# Patient Record
Sex: Male | Born: 1975 | Race: Black or African American | Hispanic: No | Marital: Single | Smoking: Former smoker
Health system: Southern US, Community
[De-identification: ages and names within clinical notes are randomized; demographics above are authoritative.]

## PROBLEM LIST (undated history)

## (undated) DIAGNOSIS — F209 Schizophrenia, unspecified: Secondary | ICD-10-CM

## (undated) DIAGNOSIS — I10 Essential (primary) hypertension: Secondary | ICD-10-CM

## (undated) DIAGNOSIS — N289 Disorder of kidney and ureter, unspecified: Secondary | ICD-10-CM

## (undated) DIAGNOSIS — S069X9A Unspecified intracranial injury with loss of consciousness of unspecified duration, initial encounter: Secondary | ICD-10-CM

## (undated) DIAGNOSIS — R569 Unspecified convulsions: Secondary | ICD-10-CM

## (undated) DIAGNOSIS — G8114 Spastic hemiplegia affecting left nondominant side: Secondary | ICD-10-CM

## (undated) DIAGNOSIS — Z789 Other specified health status: Secondary | ICD-10-CM

## (undated) DIAGNOSIS — Z593 Problems related to living in residential institution: Secondary | ICD-10-CM

## (undated) DIAGNOSIS — F319 Bipolar disorder, unspecified: Secondary | ICD-10-CM

## (undated) DIAGNOSIS — R454 Irritability and anger: Secondary | ICD-10-CM

## (undated) DIAGNOSIS — F32A Depression, unspecified: Secondary | ICD-10-CM

## (undated) DIAGNOSIS — F329 Major depressive disorder, single episode, unspecified: Secondary | ICD-10-CM

## (undated) HISTORY — PX: TRACHEOSTOMY CLOSURE: SHX458

## (undated) HISTORY — DX: Major depressive disorder, single episode, unspecified: F32.9

## (undated) HISTORY — PX: TRACHEOSTOMY: SUR1362

## (undated) HISTORY — DX: Depression, unspecified: F32.A

---

## 2004-03-29 ENCOUNTER — Emergency Department: Payer: Self-pay | Admitting: General Practice

## 2005-03-15 ENCOUNTER — Emergency Department: Payer: Self-pay | Admitting: Emergency Medicine

## 2005-04-01 ENCOUNTER — Emergency Department: Payer: Self-pay | Admitting: Internal Medicine

## 2005-04-05 ENCOUNTER — Emergency Department: Payer: Self-pay | Admitting: Emergency Medicine

## 2005-04-07 ENCOUNTER — Other Ambulatory Visit: Payer: Self-pay

## 2005-04-07 ENCOUNTER — Inpatient Hospital Stay: Payer: Self-pay | Admitting: Internal Medicine

## 2005-08-16 ENCOUNTER — Inpatient Hospital Stay: Payer: Self-pay | Admitting: Unknown Physician Specialty

## 2005-08-26 ENCOUNTER — Emergency Department: Payer: Self-pay | Admitting: Emergency Medicine

## 2007-04-23 ENCOUNTER — Emergency Department: Payer: Self-pay | Admitting: Unknown Physician Specialty

## 2007-12-03 ENCOUNTER — Emergency Department: Payer: Self-pay | Admitting: Emergency Medicine

## 2010-01-27 ENCOUNTER — Emergency Department: Payer: Self-pay | Admitting: Emergency Medicine

## 2010-03-15 DIAGNOSIS — S069X9A Unspecified intracranial injury with loss of consciousness of unspecified duration, initial encounter: Secondary | ICD-10-CM

## 2010-03-15 DIAGNOSIS — S069XAA Unspecified intracranial injury with loss of consciousness status unknown, initial encounter: Secondary | ICD-10-CM

## 2010-03-15 HISTORY — DX: Unspecified intracranial injury with loss of consciousness of unspecified duration, initial encounter: S06.9X9A

## 2010-03-15 HISTORY — DX: Unspecified intracranial injury with loss of consciousness status unknown, initial encounter: S06.9XAA

## 2010-07-08 ENCOUNTER — Emergency Department: Payer: Self-pay | Admitting: Emergency Medicine

## 2010-09-13 ENCOUNTER — Ambulatory Visit: Payer: Self-pay | Admitting: Internal Medicine

## 2010-09-18 ENCOUNTER — Emergency Department: Payer: Self-pay | Admitting: *Deleted

## 2010-09-29 ENCOUNTER — Inpatient Hospital Stay: Payer: Self-pay | Admitting: Internal Medicine

## 2010-10-14 ENCOUNTER — Ambulatory Visit: Payer: Self-pay | Admitting: Internal Medicine

## 2010-10-21 DIAGNOSIS — J984 Other disorders of lung: Secondary | ICD-10-CM

## 2010-11-14 ENCOUNTER — Ambulatory Visit: Payer: Self-pay | Admitting: Internal Medicine

## 2010-12-09 ENCOUNTER — Emergency Department (HOSPITAL_COMMUNITY)
Admission: EM | Admit: 2010-12-09 | Discharge: 2010-12-09 | Disposition: A | Discharge status: Home / Self Care | Payer: Medicaid Other | Attending: Emergency Medicine | Admitting: Emergency Medicine

## 2010-12-09 DIAGNOSIS — I10 Essential (primary) hypertension: Secondary | ICD-10-CM | POA: Insufficient documentation

## 2010-12-09 DIAGNOSIS — Y849 Medical procedure, unspecified as the cause of abnormal reaction of the patient, or of later complication, without mention of misadventure at the time of the procedure: Secondary | ICD-10-CM | POA: Insufficient documentation

## 2010-12-09 DIAGNOSIS — G40909 Epilepsy, unspecified, not intractable, without status epilepticus: Secondary | ICD-10-CM | POA: Insufficient documentation

## 2010-12-09 DIAGNOSIS — Z79899 Other long term (current) drug therapy: Secondary | ICD-10-CM | POA: Insufficient documentation

## 2010-12-09 DIAGNOSIS — Z8673 Personal history of transient ischemic attack (TIA), and cerebral infarction without residual deficits: Secondary | ICD-10-CM | POA: Insufficient documentation

## 2010-12-09 DIAGNOSIS — K9420 Gastrostomy complication, unspecified: Secondary | ICD-10-CM | POA: Insufficient documentation

## 2010-12-11 ENCOUNTER — Ambulatory Visit: Payer: Self-pay | Admitting: Internal Medicine

## 2011-01-04 ENCOUNTER — Emergency Department (HOSPITAL_COMMUNITY)
Admission: EM | Admit: 2011-01-04 | Discharge: 2011-01-06 | Disposition: A | Discharge status: Home / Self Care | Payer: Medicaid Other | Attending: Emergency Medicine | Admitting: Emergency Medicine

## 2011-01-04 DIAGNOSIS — Z8673 Personal history of transient ischemic attack (TIA), and cerebral infarction without residual deficits: Secondary | ICD-10-CM | POA: Insufficient documentation

## 2011-01-04 DIAGNOSIS — R45851 Suicidal ideations: Secondary | ICD-10-CM | POA: Insufficient documentation

## 2011-01-04 DIAGNOSIS — G40802 Other epilepsy, not intractable, without status epilepticus: Secondary | ICD-10-CM | POA: Insufficient documentation

## 2011-01-04 DIAGNOSIS — I10 Essential (primary) hypertension: Secondary | ICD-10-CM | POA: Insufficient documentation

## 2011-01-04 DIAGNOSIS — Z931 Gastrostomy status: Secondary | ICD-10-CM | POA: Insufficient documentation

## 2011-01-04 DIAGNOSIS — Z79899 Other long term (current) drug therapy: Secondary | ICD-10-CM | POA: Insufficient documentation

## 2011-01-04 LAB — COMPREHENSIVE METABOLIC PANEL
AST: 9 U/L (ref 0–37)
Albumin: 3.5 g/dL (ref 3.5–5.2)
Alkaline Phosphatase: 75 U/L (ref 39–117)
BUN: 21 mg/dL (ref 6–23)
Chloride: 109 mEq/L (ref 96–112)
Potassium: 4.1 mEq/L (ref 3.5–5.1)
Total Bilirubin: 0.2 mg/dL — ABNORMAL LOW (ref 0.3–1.2)
Total Protein: 6.9 g/dL (ref 6.0–8.3)

## 2011-01-04 LAB — DIFFERENTIAL
Basophils Absolute: 0 10*3/uL (ref 0.0–0.1)
Basophils Relative: 0 % (ref 0–1)
Eosinophils Absolute: 0.8 10*3/uL — ABNORMAL HIGH (ref 0.0–0.7)
Eosinophils Relative: 14 % — ABNORMAL HIGH (ref 0–5)
Monocytes Absolute: 0.4 10*3/uL (ref 0.1–1.0)

## 2011-01-04 LAB — CBC
MCHC: 34.3 g/dL (ref 30.0–36.0)
Platelets: 245 10*3/uL (ref 150–400)
RDW: 14.9 % (ref 11.5–15.5)
WBC: 6 10*3/uL (ref 4.0–10.5)

## 2011-01-04 LAB — RAPID URINE DRUG SCREEN, HOSP PERFORMED
Barbiturates: NOT DETECTED
Cocaine: NOT DETECTED

## 2011-06-30 ENCOUNTER — Telehealth (HOSPITAL_COMMUNITY): Payer: Self-pay

## 2011-06-30 NOTE — Telephone Encounter (Signed)
Left a message for Dr. Mikeal Hawthorne office returning the call that they left on the voice mail for this pt

## 2011-07-01 ENCOUNTER — Other Ambulatory Visit (HOSPITAL_COMMUNITY): Payer: Self-pay | Admitting: Internal Medicine

## 2011-07-01 DIAGNOSIS — R109 Unspecified abdominal pain: Secondary | ICD-10-CM

## 2011-07-01 DIAGNOSIS — F29 Unspecified psychosis not due to a substance or known physiological condition: Secondary | ICD-10-CM

## 2011-07-01 DIAGNOSIS — R269 Unspecified abnormalities of gait and mobility: Secondary | ICD-10-CM

## 2011-07-01 DIAGNOSIS — G40802 Other epilepsy, not intractable, without status epilepticus: Secondary | ICD-10-CM

## 2011-07-01 DIAGNOSIS — N186 End stage renal disease: Secondary | ICD-10-CM

## 2011-07-01 DIAGNOSIS — W101XXA Fall (on)(from) sidewalk curb, initial encounter: Secondary | ICD-10-CM

## 2011-07-05 ENCOUNTER — Ambulatory Visit (HOSPITAL_COMMUNITY)
Admission: RE | Admit: 2011-07-05 | Discharge: 2011-07-05 | Disposition: A | Discharge status: Home / Self Care | Payer: Medicaid Other | Source: Ambulatory Visit | Attending: Internal Medicine | Admitting: Internal Medicine

## 2011-07-05 DIAGNOSIS — F29 Unspecified psychosis not due to a substance or known physiological condition: Secondary | ICD-10-CM

## 2011-07-05 DIAGNOSIS — G40802 Other epilepsy, not intractable, without status epilepticus: Secondary | ICD-10-CM

## 2011-07-05 DIAGNOSIS — R269 Unspecified abnormalities of gait and mobility: Secondary | ICD-10-CM

## 2011-07-05 DIAGNOSIS — N186 End stage renal disease: Secondary | ICD-10-CM

## 2011-07-05 DIAGNOSIS — W101XXA Fall (on)(from) sidewalk curb, initial encounter: Secondary | ICD-10-CM

## 2011-07-05 DIAGNOSIS — R109 Unspecified abdominal pain: Secondary | ICD-10-CM

## 2011-08-04 ENCOUNTER — Other Ambulatory Visit: Payer: Self-pay | Admitting: Internal Medicine

## 2011-08-04 DIAGNOSIS — W101XXA Fall (on)(from) sidewalk curb, initial encounter: Secondary | ICD-10-CM

## 2011-08-04 DIAGNOSIS — F29 Unspecified psychosis not due to a substance or known physiological condition: Secondary | ICD-10-CM

## 2011-08-04 DIAGNOSIS — G40802 Other epilepsy, not intractable, without status epilepticus: Secondary | ICD-10-CM

## 2011-08-10 ENCOUNTER — Other Ambulatory Visit: Payer: Medicaid Other

## 2012-10-29 ENCOUNTER — Emergency Department (HOSPITAL_COMMUNITY)
Admission: EM | Admit: 2012-10-29 | Discharge: 2012-10-29 | Disposition: A | Discharge status: Home / Self Care | Payer: Medicaid Other | Attending: Emergency Medicine | Admitting: Emergency Medicine

## 2012-10-29 ENCOUNTER — Encounter (HOSPITAL_COMMUNITY): Payer: Self-pay

## 2012-10-29 DIAGNOSIS — I10 Essential (primary) hypertension: Secondary | ICD-10-CM | POA: Insufficient documentation

## 2012-10-29 DIAGNOSIS — G40909 Epilepsy, unspecified, not intractable, without status epilepticus: Secondary | ICD-10-CM | POA: Insufficient documentation

## 2012-10-29 DIAGNOSIS — R569 Unspecified convulsions: Secondary | ICD-10-CM

## 2012-10-29 HISTORY — DX: Unspecified convulsions: R56.9

## 2012-10-29 HISTORY — DX: Essential (primary) hypertension: I10

## 2012-10-29 NOTE — ED Notes (Signed)
Unable to get in touch with emergency contact. Pt unable to provide any numbers to call for transport home. [facility] called to transport.

## 2012-10-29 NOTE — ED Provider Notes (Signed)
  CSN: [id number]     Arrival date & time 10/29/12  0555 History     First MD Initiated Contact with Patient 10/29/12 [phone]     Chief Complaint  Patient presents with  . Seizures   (Consider location/radiation/quality/duration/timing/severity/associated sxs/prior Treatment) Patient is a 37 y.o. male presenting with seizures. The history is provided by the EMS personnel and the patient.  Seizures He was reported to have had a seizure at home. Patient does not have any memory of the incident but has no complaints. No bowel or bladder incontinence and denies bit lip or tongue. He does have a history of seizure disorder and he states he has been compliant with his medications.  Past Medical History  Diagnosis Date  . Seizures   . Hypertension    History reviewed. No pertinent past surgical history. No family history on file. History  Substance Use Topics  . Smoking status: Never Smoker   . Smokeless tobacco: Not on file  . Alcohol Use: No    Review of Systems  Neurological: Positive for seizures.  All other systems reviewed and are negative.    Allergies  Review of patient's allergies indicates no known allergies.  Home Medications  No current outpatient prescriptions on file. BP 132/87  Pulse 98  Resp 22  Ht 6\' 4"  (1.93 m)  Wt 220 lb (99.791 kg)  BMI 26.79 kg/m2  SpO2 100% Physical Exam  Nursing note and vitals reviewed.  37 year old male, resting comfortably and in no acute distress. Vital signs are significant for tachypnea with respiratory rate of 22. Oxygen saturation is 100%, which is normal. Head is normocephalic and atraumatic. PERRLA, EOMI. Oropharynx is clear. Neck is nontender and supple without adenopathy or JVD. Back is nontender and there is no CVA tenderness. Lungs are clear without rales, wheezes, or rhonchi. Chest is nontender. Heart has regular rate and rhythm without murmur. Abdomen is soft, flat, nontender without masses or hepatosplenomegaly and  peristalsis is normoactive. Extremities have no cyanosis or edema, full range of motion is present. Skin is warm and dry without rash. Neurologic: He is oriented to person but not place or time, cranial nerves are intact, there are no motor or sensory deficits.  ED Course   Procedures (including critical care time)  1. Seizure     MDM  A seizure in patient with known seizure disorder. Old records are reviewed and I denies any prior ED visits for seizures, but he is reported to of been on levetiracetam. He did receive midazolam in the ambulance coming to the ED. I do not see any indication for additional workup today. Blood levels of levetiracetam are not available in the ED.  He was observed in the ED with no further seizure activity. He is discharged to followup with his PCP who will need to decide whether to adjust his anticonvulsant medication.  Dione Booze, MD 10/29/12 [phone]

## 2012-10-29 NOTE — ED Notes (Signed)
Pt unable to sign discharge paperwork. Able to contact mother, she is on the way to pick up patient. Requesting that he sit in waiting room to wait on her. Vital signs stable.
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
## 2012-12-31 ENCOUNTER — Emergency Department (HOSPITAL_COMMUNITY)
Admission: EM | Admit: 2012-12-31 | Discharge: 2012-12-31 | Disposition: A | Discharge status: Home / Self Care | Payer: Medicaid Other | Attending: Emergency Medicine | Admitting: Emergency Medicine

## 2012-12-31 ENCOUNTER — Encounter (HOSPITAL_COMMUNITY): Payer: Self-pay | Admitting: Emergency Medicine

## 2012-12-31 ENCOUNTER — Emergency Department (HOSPITAL_COMMUNITY): Payer: Medicaid Other

## 2012-12-31 DIAGNOSIS — G40909 Epilepsy, unspecified, not intractable, without status epilepticus: Secondary | ICD-10-CM | POA: Insufficient documentation

## 2012-12-31 DIAGNOSIS — Z91199 Patient's noncompliance with other medical treatment and regimen due to unspecified reason: Secondary | ICD-10-CM | POA: Insufficient documentation

## 2012-12-31 DIAGNOSIS — S0120XA Unspecified open wound of nose, initial encounter: Secondary | ICD-10-CM | POA: Insufficient documentation

## 2012-12-31 DIAGNOSIS — F209 Schizophrenia, unspecified: Secondary | ICD-10-CM | POA: Insufficient documentation

## 2012-12-31 DIAGNOSIS — F319 Bipolar disorder, unspecified: Secondary | ICD-10-CM | POA: Insufficient documentation

## 2012-12-31 DIAGNOSIS — Z87820 Personal history of traumatic brain injury: Secondary | ICD-10-CM | POA: Insufficient documentation

## 2012-12-31 DIAGNOSIS — E876 Hypokalemia: Secondary | ICD-10-CM | POA: Insufficient documentation

## 2012-12-31 DIAGNOSIS — Z23 Encounter for immunization: Secondary | ICD-10-CM | POA: Insufficient documentation

## 2012-12-31 DIAGNOSIS — R569 Unspecified convulsions: Secondary | ICD-10-CM

## 2012-12-31 DIAGNOSIS — I10 Essential (primary) hypertension: Secondary | ICD-10-CM | POA: Insufficient documentation

## 2012-12-31 DIAGNOSIS — Y939 Activity, unspecified: Secondary | ICD-10-CM | POA: Insufficient documentation

## 2012-12-31 DIAGNOSIS — W2203XA Walked into furniture, initial encounter: Secondary | ICD-10-CM | POA: Insufficient documentation

## 2012-12-31 DIAGNOSIS — R451 Restlessness and agitation: Secondary | ICD-10-CM

## 2012-12-31 DIAGNOSIS — E860 Dehydration: Secondary | ICD-10-CM | POA: Insufficient documentation

## 2012-12-31 DIAGNOSIS — Y92009 Unspecified place in unspecified non-institutional (private) residence as the place of occurrence of the external cause: Secondary | ICD-10-CM | POA: Insufficient documentation

## 2012-12-31 DIAGNOSIS — Z9119 Patient's noncompliance with other medical treatment and regimen: Secondary | ICD-10-CM | POA: Insufficient documentation

## 2012-12-31 DIAGNOSIS — IMO0002 Reserved for concepts with insufficient information to code with codable children: Secondary | ICD-10-CM | POA: Insufficient documentation

## 2012-12-31 DIAGNOSIS — T148XXA Other injury of unspecified body region, initial encounter: Secondary | ICD-10-CM

## 2012-12-31 DIAGNOSIS — Z9114 Patient's other noncompliance with medication regimen: Secondary | ICD-10-CM

## 2012-12-31 LAB — CBC WITH DIFFERENTIAL/PLATELET
HCT: 44.2 % (ref 39.0–52.0)
Hemoglobin: 15.9 g/dL (ref 13.0–17.0)
Lymphocytes Relative: 22 % (ref 12–46)
Lymphs Abs: 1.4 10*3/uL (ref 0.7–4.0)
MCHC: 36 g/dL (ref 30.0–36.0)
Monocytes Absolute: 0.3 10*3/uL (ref 0.1–1.0)
Monocytes Relative: 5 % (ref 3–12)
Neutro Abs: 4.8 10*3/uL (ref 1.7–7.7)
WBC: 6.6 10*3/uL (ref 4.0–10.5)

## 2012-12-31 LAB — BASIC METABOLIC PANEL
BUN: 14 mg/dL (ref 6–23)
CO2: 17 mEq/L — ABNORMAL LOW (ref 19–32)
Chloride: 97 mEq/L (ref 96–112)
Creatinine, Ser: 1.45 mg/dL — ABNORMAL HIGH (ref 0.50–1.35)
Glucose, Bld: 145 mg/dL — ABNORMAL HIGH (ref 70–99)

## 2012-12-31 MED ORDER — SODIUM CHLORIDE 0.9 % IV SOLN
1000.0000 mg | Freq: Once | INTRAVENOUS | Status: AC
  Administered 2012-12-31: 1000 mg via INTRAVENOUS
  Filled 2012-12-31 (×2): qty 10

## 2012-12-31 MED ORDER — ACETAMINOPHEN 325 MG PO TABS
650.0000 mg | ORAL_TABLET | Freq: Once | ORAL | Status: AC
  Administered 2012-12-31: 650 mg via ORAL
  Filled 2012-12-31: qty 2

## 2012-12-31 MED ORDER — TETANUS-DIPHTH-ACELL PERTUSSIS 5-2.5-18.5 LF-MCG/0.5 IM SUSP
0.5000 mL | Freq: Once | INTRAMUSCULAR | Status: AC
  Administered 2012-12-31: 0.5 mL via INTRAMUSCULAR
  Filled 2012-12-31: qty 0.5

## 2012-12-31 MED ORDER — SODIUM CHLORIDE 0.9 % IV BOLUS (SEPSIS)
1000.0000 mL | Freq: Once | INTRAVENOUS | Status: AC
  Administered 2012-12-31: 1000 mL via INTRAVENOUS

## 2012-12-31 MED ORDER — POTASSIUM CHLORIDE CRYS ER 20 MEQ PO TBCR
40.0000 meq | EXTENDED_RELEASE_TABLET | Freq: Once | ORAL | Status: AC
  Administered 2012-12-31: 40 meq via ORAL
  Filled 2012-12-31: qty 2

## 2012-12-31 NOTE — ED Notes (Signed)
Family called wants pt out front and ready to go
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
## 2012-12-31 NOTE — ED Notes (Signed)
[phone] [name] (Mother)

## 2012-12-31 NOTE — ED Notes (Signed)
Pt transported to CT and x-ray. [employer] at bedside.
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
## 2013-01-29 ENCOUNTER — Encounter (HOSPITAL_COMMUNITY): Payer: Self-pay | Admitting: Emergency Medicine

## 2013-01-29 ENCOUNTER — Emergency Department (HOSPITAL_COMMUNITY)
Admission: EM | Admit: 2013-01-29 | Discharge: 2013-02-02 | Disposition: A | Discharge status: Home / Self Care | Payer: Medicaid Other | Attending: Emergency Medicine | Admitting: Emergency Medicine

## 2013-01-29 DIAGNOSIS — I10 Essential (primary) hypertension: Secondary | ICD-10-CM | POA: Insufficient documentation

## 2013-01-29 DIAGNOSIS — F911 Conduct disorder, childhood-onset type: Secondary | ICD-10-CM | POA: Insufficient documentation

## 2013-01-29 DIAGNOSIS — R45851 Suicidal ideations: Secondary | ICD-10-CM | POA: Insufficient documentation

## 2013-01-29 DIAGNOSIS — Z8659 Personal history of other mental and behavioral disorders: Secondary | ICD-10-CM | POA: Insufficient documentation

## 2013-01-29 DIAGNOSIS — R4689 Other symptoms and signs involving appearance and behavior: Secondary | ICD-10-CM

## 2013-01-29 DIAGNOSIS — Z8669 Personal history of other diseases of the nervous system and sense organs: Secondary | ICD-10-CM | POA: Insufficient documentation

## 2013-01-29 DIAGNOSIS — Z87820 Personal history of traumatic brain injury: Secondary | ICD-10-CM | POA: Insufficient documentation

## 2013-01-29 HISTORY — DX: Schizophrenia, unspecified: F20.9

## 2013-01-29 HISTORY — DX: Bipolar disorder, unspecified: F31.9

## 2013-01-29 HISTORY — DX: Unspecified intracranial injury with loss of consciousness of unspecified duration, initial encounter: S06.9X9A

## 2013-01-29 LAB — RAPID URINE DRUG SCREEN, HOSP PERFORMED
Amphetamines: NOT DETECTED
Barbiturates: NOT DETECTED
Benzodiazepines: NOT DETECTED
Cocaine: NOT DETECTED

## 2013-01-29 LAB — COMPREHENSIVE METABOLIC PANEL
ALT: 73 U/L — ABNORMAL HIGH (ref 0–53)
AST: 22 U/L (ref 0–37)
Albumin: 5 g/dL (ref 3.5–5.2)
Alkaline Phosphatase: 86 U/L (ref 39–117)
BUN: 19 mg/dL (ref 6–23)
Chloride: 101 mEq/L (ref 96–112)
Potassium: 3.9 mEq/L (ref 3.5–5.1)
Total Bilirubin: 0.5 mg/dL (ref 0.3–1.2)
Total Protein: 9.2 g/dL — ABNORMAL HIGH (ref 6.0–8.3)

## 2013-01-29 LAB — CBC
Hemoglobin: 14.4 g/dL (ref 13.0–17.0)
MCH: 30.1 pg (ref 26.0–34.0)
MCV: 88.7 fL (ref 78.0–100.0)
RBC: 4.79 MIL/uL (ref 4.22–5.81)

## 2013-01-29 NOTE — ED Notes (Signed)
[employer] at bedside 

## 2013-01-29 NOTE — ED Provider Notes (Signed)
CSN: [id number]     Arrival date & time 01/29/13  1555 History   First MD Initiated Contact with Patient 01/29/13 2006     Chief Complaint  Patient presents with  . Medical Clearance   (Consider location/radiation/quality/duration/timing/severity/associated sxs/prior Treatment) HPI Comments: 37 yo male with TBI, bipolar, schizophrenia hx presents with possible SI and aggressive behavior the past few days.  Per report pt has been telling mother that he is suicidal and has shown increased aggression lately.  Pt denies all sxs at this time, denies aggression, SI or HI.  He is cooperative.  He says his mother wants him checked out.  No new meds.  Nothing worsens sxs.   The history is provided by the patient and the EMS personnel.    Past Medical History  Diagnosis Date  . Seizures   . Hypertension   . TBI (traumatic brain injury)   . Bipolar disorder   . Schizophrenia    History reviewed. No pertinent past surgical history. History reviewed. No pertinent family history. History  Substance Use Topics  . Smoking status: Never Smoker   . Smokeless tobacco: Not on file  . Alcohol Use: No    Review of Systems  Constitutional: Negative for fever and chills.  HENT: Negative for congestion.   Eyes: Negative for visual disturbance.  Respiratory: Negative for shortness of breath.   Cardiovascular: Negative for chest pain.  Gastrointestinal: Negative for vomiting and abdominal pain.  Genitourinary: Negative for dysuria and flank pain.  Musculoskeletal: Negative for neck pain and neck stiffness.  Skin: Negative for rash.  Neurological: Negative for light-headedness and headaches.  Psychiatric/Behavioral: Positive for suicidal ideas (per report).    Allergies  Review of patient's allergies indicates no known allergies.  Home Medications  No current outpatient prescriptions on file. BP 114/79  Pulse 59  Temp(Src) 98.2 F (36.8 C) (Oral)  Resp 16  SpO2 100% Physical Exam   Nursing note and vitals reviewed. Constitutional: He is oriented to person, place, and time. He appears well-developed and well-nourished.  HENT:  Head: Normocephalic and atraumatic.  Eyes: Conjunctivae are normal. Right eye exhibits no discharge. Left eye exhibits no discharge.  Neck: Normal range of motion. Neck supple. No tracheal deviation present.  Cardiovascular: Normal rate and regular rhythm.   Pulmonary/Chest: Effort normal and breath sounds normal.  Abdominal: Soft. He exhibits no distension. There is no tenderness. There is no guarding.  Musculoskeletal: He exhibits no edema.  Neurological: He is alert and oriented to person, place, and time. He has normal strength. No cranial nerve deficit or sensory deficit. GCS eye subscore is 4. GCS verbal subscore is 5. GCS motor subscore is 6.  Skin: Skin is warm. No rash noted.  Psychiatric: He has a normal mood and affect. His mood appears not anxious. His affect is not angry and not inappropriate. His speech is not tangential. He is slowed. Thought content is not paranoid. He does not exhibit a depressed mood. He expresses no homicidal and no suicidal ideation. He expresses no suicidal plans and no homicidal plans.  Mild slowing mentally     ED Course  Procedures (including critical care time) Labs Review Labs Reviewed  COMPREHENSIVE METABOLIC PANEL - Abnormal; Notable for the following:    Creatinine, Ser 1.72 (*)    Total Protein 9.2 (*)    ALT 73 (*)    GFR calc non Af Amer 49 (*)    GFR calc Af Amer 57 (*)    All other  components within normal limits  SALICYLATE LEVEL - Abnormal; Notable for the following:    Salicylate Lvl <2.0 (*)    All other components within normal limits  ACETAMINOPHEN LEVEL  CBC  ETHANOL  URINE RAPID DRUG SCREEN (HOSP PERFORMED)   Imaging Review No results found.  EKG Interpretation   None       MDM  No diagnosis found. Mixed presentation, pt denies all sxs, family states recent SI/  aggression. Pt is cooperative for eval.  Psychiatry consulted for assistance.  IVC papers done prior to arrival.    Pt medically clear in ED, htn can be followed outpt.   Aggression, HTN, Renal insuff  Enid Skeens, MD 01/30/13 [phone]

## 2013-01-29 NOTE — BH Assessment (Signed)
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  Brought in by mother for aggression at home.  He denies everything and is cooperative, but mother wants him admitted for SI and aggression.

## 2013-01-29 NOTE — ED Notes (Signed)
Pt here with [employer] with IVC papers; pt sts some arguing with him mother but denies not taking meds or SI/HI; pt cooperative at present

## 2013-01-29 NOTE — ED Notes (Signed)
Per pt he is unsure of why he is here today. States that he has been "frustrated" with his mom. States "I live with her and her two kids. I just want to get out. I don't want to live there anymore. I even tried to visit my cousin last night, and she called him and told him not to let me in. I am upset." Pt denies any SI or HI. Denies wanting to hurt mother. Just keeps repeating "I want to get out. I don't want to live there anymore." Pt is calm, cooperative. Sitter at bedside.

## 2013-01-30 MED ORDER — CLONAZEPAM 0.5 MG PO TABS
0.5000 mg | ORAL_TABLET | Freq: Every day | ORAL | Status: DC | PRN
  Administered 2013-01-30 – 2013-01-31 (×2): 0.5 mg via ORAL
  Filled 2013-01-30 (×2): qty 1

## 2013-01-30 MED ORDER — LORAZEPAM 1 MG PO TABS
1.0000 mg | ORAL_TABLET | Freq: Three times a day (TID) | ORAL | Status: DC | PRN
  Administered 2013-01-31: 1 mg via ORAL
  Filled 2013-01-30: qty 1

## 2013-01-30 MED ORDER — LURASIDONE HCL 40 MG PO TABS
60.0000 mg | ORAL_TABLET | Freq: Every day | ORAL | Status: DC
  Administered 2013-01-30 – 2013-02-01 (×3): 60 mg via ORAL
  Filled 2013-01-30 (×4): qty 2

## 2013-01-30 MED ORDER — MIRTAZAPINE 15 MG PO TABS
15.0000 mg | ORAL_TABLET | Freq: Every day | ORAL | Status: DC
  Administered 2013-01-30 – 2013-02-01 (×3): 15 mg via ORAL
  Filled 2013-01-30 (×4): qty 1

## 2013-01-30 MED ORDER — AMLODIPINE BESYLATE 10 MG PO TABS
10.0000 mg | ORAL_TABLET | Freq: Every day | ORAL | Status: DC
  Administered 2013-01-30 – 2013-02-02 (×4): 10 mg via ORAL
  Filled 2013-01-30 (×4): qty 1

## 2013-01-30 MED ORDER — CYCLOBENZAPRINE HCL 10 MG PO TABS
5.0000 mg | ORAL_TABLET | Freq: Every day | ORAL | Status: DC
  Administered 2013-01-30 – 2013-02-01 (×3): 5 mg via ORAL
  Filled 2013-01-30 (×3): qty 1

## 2013-01-30 MED ORDER — ACETAMINOPHEN 325 MG PO TABS
650.0000 mg | ORAL_TABLET | ORAL | Status: DC | PRN
  Administered 2013-02-01: 650 mg via ORAL
  Filled 2013-01-30: qty 2

## 2013-01-30 MED ORDER — LEVETIRACETAM 500 MG PO TABS
500.0000 mg | ORAL_TABLET | Freq: Two times a day (BID) | ORAL | Status: DC
  Administered 2013-01-30 – 2013-02-02 (×7): 500 mg via ORAL
  Filled 2013-01-30 (×8): qty 1
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
## 2013-01-30 NOTE — BHH Counselor (Signed)
Writer called Dr. Ceasar Mons office to get him to do TP for pt. Glen Johnson states that Dr. Demetrius Charity has no time to see anyone today as he is completely booked.  Evette Cristal, [place] Assessment Counselor

## 2013-01-30 NOTE — ED Notes (Addendum)
Patient is irritable . He doesn't understand why he cant leave and go to his school in [place]. He denies wanting to hurt himself or others. He just wants to go home
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
## 2013-01-30 NOTE — ED Notes (Signed)
Pt. Given graham crackers and sprite for snack.

## 2013-01-30 NOTE — ED Notes (Signed)
TELEPSYCH IN PROGRESS FOR [facility]

## 2013-01-30 NOTE — ED Notes (Signed)
Glen Johnson presently interview the patient

## 2013-01-31 NOTE — ED Notes (Signed)
Attempted to call pt mother at both numbers we have listed in chart

## 2013-01-31 NOTE — ED Notes (Signed)
IVC PAPERS HAVE BEEN RESENDED AND FAXED TO [facility]

## 2013-01-31 NOTE — Progress Notes (Signed)
Documentation from the nurse Drinda Butts) in the epic chart reports that the patient now reports that he wants to go home.  Writer was unsuccessful in locating an extender to assess the patient regarding his disposition and IVC status.    Lloyd Huger, NP was not able to assess the patient due to a prior meeting out of the hospital. Writer will attempt to locate another extender to assess the patient.   Writer was informed that the Vernona Rieger, Dr. Izell [place] and Dr. Dub Mikes have 5 new admission and 3 discharges and various other meetings throughout the day and will not be able to give me a time frame as to when any one would be able to see the patient. Writer was also informed that no knows what time the Lloyd Huger will be coming back from her scheduled meeting.   Writer will follow up with leadership in order to determine how a provider can be located to assist with the needed Tele Psych. Writer informed the nurse Drinda Butts) working with the patient of the current status.

## 2013-01-31 NOTE — ED Notes (Signed)
DEBRA FRISCO IS NO LONGER ASSOCIATED WITH "[employer]" AND DOES NOT WISH TO HAVE HER CELL PHONE NUMBER PUBLISHED IN ASSOCIATION WITH THIS CLIENT.

## 2013-01-31 NOTE — ED Notes (Signed)
ATTEMPTED TO CONTACT PT MOTHER AT BOTH NUMBERS LISTED IN CHART. NO ANSWER

## 2013-01-31 NOTE — ED Notes (Signed)
Pt up to desk to call his mother 

## 2013-01-31 NOTE — ED Notes (Signed)
Attempted to call pt mother at both numbers we have listed. No answer
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
## 2013-01-31 NOTE — ED Notes (Signed)
SPOKE WITH AVA AT [facility]. SHE IS AWARE OF DISCHARGE SITUATION. SHE IS GOING TO TALK TO PT ACT TEAM WORKER RALPH

## 2013-01-31 NOTE — Progress Notes (Signed)
Writer left  the [employer] worker Rayna Sexton at [phone]) a message on his voice mail regarding the patients mother refusal to pick up the patient.    Writer contacted the mail office at [facility] where the patient receives services and requested that the supervisor contact the [facility] Office regarding this patient.

## 2013-01-31 NOTE — Progress Notes (Signed)
Writer consulted with Dr. Lucianne Muss regarding the patient not meeting criteria for inpatient hospitalization.  At present the patient denies SI, HI and psychosis.  Patient has been compliant with taking his medication while in the ER.  Patient is able to contract for safety.

## 2013-01-31 NOTE — ED Notes (Signed)
[employer] IN TO TALK TO PT AT HIS REQUEST. HE WANTS TO GET HIS THINGS FROM HIS MOTHERS HOME.

## 2013-01-31 NOTE — ED Notes (Signed)
Spoke with Glen Johnson on morning conference call. They advise to speak with pt mother to see if he is able to come back home today.

## 2013-01-31 NOTE — Progress Notes (Signed)
Glen Johnson, MHT received report from [facility] of decline due to being out of network

## 2013-01-31 NOTE — ED Notes (Signed)
Pt states he would like to go home today. Assured pt that i would call this morning to see what they could do.

## 2013-01-31 NOTE — ED Notes (Signed)
PATIENT MOTHER VERY RUDE ON PHONE. YELLS OVER AND OVER AT THIS NURSE THAT SHE "CANT TAKE HIM NO MORE" STATES "I DONT TALK TO NO NURSES ABOUT ANYTHING"  WHEN ASKED IF SHE IS RELINQUISHING HER GUARDIANSHIP OF PATIENT SINCE SHE DOES NOT WANT HIM BACK IN HER HOME PATIENT MOTHER WOULD NOT GIVE ANY DIRECT ANSWER. SHE CONTINUED TO YELL AND SPEAK RUDELY. SHE YELLED THAT "HE NEED LONG TERM CARE. PUT HIM IN A GROUP HOME"

## 2013-01-31 NOTE — ED Notes (Signed)
Social worker at bedside.

## 2013-01-31 NOTE — ED Notes (Addendum)
Attempted to call pt mother at both numbers we have listed in chart, no answer

## 2013-01-31 NOTE — Progress Notes (Signed)
RN Drinda Butts) has given the ER MD the form to rescind the IVC.   After the IVC form has been faxed the [facility] the incoming staff will need to contact Adult Protective Services regarding the guardian refusing to pick on the patient.   RN Drinda Butts) reports that she has put in a consult for social work so that they can find placement for the patient.

## 2013-01-31 NOTE — Progress Notes (Signed)
B.Terrionna Bridwell, MHT completed placement search by contacting the following facilities listed below;   [facility] reports exclusionary can't except referral [facility] states that information regarding proof of MR must accompany referral [facility] faxed referral for review [facility] faxed referral for review [facility] not available [facility] faxed for review [facility] at capacity faxed for review [facility] at capacity check back on 11/20 or 11/21 Letitia Libra at [employer] [facility] faxed for review [facility] faxed for review  Note: Referrals submitted without information regarding proof of MR. TTS or disposition tech should contact guardian or provider to obtain psychological or proof of MR. Contact with guardian or provider could not be made due to time of placement search (over night am hrs). Loel Ro with [employer] program [phone] cell [phone] or ACTT [facility] therapist Rayna Sexton at [phone]

## 2013-01-31 NOTE — Progress Notes (Signed)
Writer consulted with Dr. Lucianne Muss regarding the patients mother refusing to pick up the patient.  Writer was informed by Dr. Lucianne Muss that the patient does not meet criteria for inpatient hospitalization and the ER MD will be able to rescind the IVC for the patient.   Writer will contact APS regarding the patients mother refusing to pick up the patient when she is his legal guardian.   Dr. Lucianne Muss has requested social work to assist with placing the patient in a facility.   Patient seen, examined via telemetry psych and patient seems calm, cooperative and can be discharged home. Mother has refused to come and pickup patient and so report is being made to APS

## 2013-01-31 NOTE — ED Notes (Signed)
Dr Lucianne Muss on tele machine to assess pt
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
## 2013-02-01 ENCOUNTER — Ambulatory Visit: Payer: Medicaid Other | Admitting: Neurology

## 2013-02-01 NOTE — ED Notes (Signed)
Per Magda Paganini sts patient physiatrist name unknown  Jody s.w. To call [phone] when she comes in.

## 2013-02-01 NOTE — Progress Notes (Signed)
Patient seen, examined and agree with the above findings

## 2013-02-01 NOTE — ED Notes (Signed)
Social worker at bed side to see PT.

## 2013-02-01 NOTE — ED Provider Notes (Signed)
Pt calm, alert, content. Vitals normal. Nad.  Discussed with sw/psych team - sw requests repeat telepsych/psychiatrist to re-evaluate patient.  On review of pt/chart, appears pts med/psych meds not adjusted or changed.  Will ask psychiatrist to re-evaluate patient including assessing meds as well as asking psych team to facilitate plan for outpt follow up upon eventual d/c.    SW indicates she has spoken w mom, and that currently did not feel safe taking home, but felt that if pts meds adjusted, taking meds, stabilized on meds, then would be able to return to home.    Suzi Roots, MD 02/01/13 [phone]

## 2013-02-01 NOTE — Social Work (Signed)
Social work rec'd follow up call from APS- they are assigning this case to an APS worker Barista [phone]) who will be coming over to see patient. I have contacted mother to update her- left message for her return call- CSW plans to inquire with mother about her wishes as she is his legal guardian- If she reports she no longer feels she can provide housing or care to her son and plans to relinquish her guardianship rights she will have to do this formally at the court house- CSW will advise her of this when contact is made.  CSW will update as contact and updates are rec'd via APS and mother- Reece Levy, MSW, [employer] [phone]

## 2013-02-01 NOTE — ED Notes (Signed)
Social work at bedside updating pt on plan of care.
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
## 2013-02-01 NOTE — ED Notes (Signed)
Spoke with Santina Evans at [facility] re: plan for pt/rec for IP tx per Verne Spurr.  Per notes on 01/31/2013, pt cleared by psych and IVC paperwork rescinded.  Now, [facility] writing "recommendation for inpatient treatment stands as written."  Santina Evans not working with pt, but agreeable to put CSW in touch with Elijah Birk who is more familiar with pt.  Recived TC from [employer] re: pt [facility] rec.  Tom clarified that Lloyd Huger was in agreement with Dr.  Remus Blake recommendation that pt did not need IP BH tx.  No face/face c/s planned for the moment, but MD can request if he feels it is necessary.  CSW to discuss with MD.  Sherron Monday with pt's APS Worker, Mickey Farber, who will be meeting with DSS placement specialist in the am re: housing options for pt.  Vickii Penna, LCSW will be providing coverage to the ED in the am, and can be reached at [phone].  Updates will be provided to [facility].

## 2013-02-01 NOTE — ED Notes (Signed)
Pt awoke with c/o headache.  When asked to rate on 1-10 scale, pt just grunted.  Appears too sleepy to give accurate answer.

## 2013-02-01 NOTE — Consult Note (Signed)
Glen Johnson [id number] 02/01/2013 4:53 PM  SW requested at 2nd Tele-psych evaluation for treatment plan. I have reviewed this case and recommend that any medication adjustment should be made with this patient's current out-patient provider. Tele-psych is a consult service only. The recommendation for in patient treatment stands as written.   Thank you for your assistance with this patient. Rona Ravens. [name] [facility] 02/01/2013 5:01 PM

## 2013-02-01 NOTE — ED Notes (Signed)
Mickey Farber- s.w. With [facility] was at bedside assessing pt.   [phone].   Social worker to call.

## 2013-02-01 NOTE — ED Notes (Addendum)
Charlotte Sanes nurse called to speak to pt.    [phone]

## 2013-02-01 NOTE — Consult Note (Signed)
Case discussed, I agree with plan 

## 2013-02-01 NOTE — ED Notes (Signed)
Meal given

## 2013-02-02 NOTE — Progress Notes (Signed)
Call CSW to verify dispositon plan. Unable to reach at this time. Will attempt to contact later.

## 2013-02-02 NOTE — Clinical Social Work Note (Signed)
Group Home per Carollee Herter, Director only requires FL2 not PASARR.  This has been completed and faxed to Group Home for review.  CSW will continue to follow.  Vickii Penna, LCSWA [phone]  Clinical Social Work

## 2013-02-02 NOTE — Clinical Social Work Note (Signed)
3:00pm Pt is tentatively accepted to [facility].  Director is Radiation protection practitioner and can be reached at [phone].  Carollee Herter is in the process of phone assessment with pt mother/guardian.  This acceptance is pending financials.  Once everything has been confirmed, Carollee Herter will contact ED RN to arrange transportation/discharge (Group Home will pick pt up from ED).     2:35pm CSW spoke with RN, Clydie Braun in the ED who assisted CSW with MD signing the FL2.  CSW was provided to Clydie Braun, Charity fundraiser who stated that she would relay information to Clydie Braun, RN who she was training.

## 2013-02-02 NOTE — Clinical Social Work Note (Signed)
CSW contacted: - [facility] - currently at capacity- no residential space available - [facility] - Carollee Herter, Director [phone] (cell) fax: [phone]  CSW contacted pt mother, [name] to review group home information.  [name] agreeable to [facility].  CSW also gave Carollee Herter, Interior and spatial designer of group home, pt mother/guardian's phone number to start this process.  Director stated that a face-to-face assessment maybe necessary for placement.  She will review his paperwork and inform CSW of her group home protocol.  CSW will continue to assist with placement.  Vickii Penna, LCSWA [phone]  Clinical Social Work

## 2013-02-02 NOTE — Clinical Social Work Note (Signed)
CSW contacted Mickey Farber, Social Worker for DSS at [phone] re: placement for pt.  Randa Evens had no answers and stated she had not had the opportunity to meet with the placement team yet this morning.  Confirmed that Randa Evens had CSW contact information.  Randa Evens will contact CSW once she has guidance from the placement team.  At this time, DSS continues to seek placement for pt.  Vickii Penna, LCSWA [phone]  Clinical Social Work

## 2013-02-02 NOTE — ED Provider Notes (Signed)
BP 141/78  Pulse 77  Temp(Src) 98.1 F (36.7 C) (Oral)  Resp 17  SpO2 99% Pt stable, no distress, watching TV He has been accepted to group home   Joya Gaskins, MD 02/02/13 1519

## 2013-02-02 NOTE — ED Notes (Signed)
Spoke to representative for [facility], the rep [name] is faxing over the signed consent form from the mother to release the patient.

## 2013-02-02 NOTE — ED Notes (Addendum)
Glen Johnson with Case Management confirmed she spoke to Child psychotherapist.  Rep with [facility] going to mother to have her sign consent for admission.  Address for [facility] [address] [place], [place]

## 2013-02-02 NOTE — Progress Notes (Addendum)
ED CM was updated by ED covering CSW, Waiting for the mom to speak with group home regarding the acceptance process. Discussed this  with Evangeline Gula on [address]

## 2013-04-02 ENCOUNTER — Ambulatory Visit: Payer: Medicaid Other | Admitting: Neurology

## 2014-05-28 ENCOUNTER — Encounter (HOSPITAL_COMMUNITY): Payer: Self-pay | Admitting: *Deleted

## 2014-05-28 ENCOUNTER — Emergency Department (HOSPITAL_COMMUNITY)
Admission: EM | Admit: 2014-05-28 | Discharge: 2014-05-29 | Disposition: A | Discharge status: Home / Self Care | Payer: Medicaid Other | Attending: Emergency Medicine | Admitting: Emergency Medicine

## 2014-05-28 DIAGNOSIS — G40909 Epilepsy, unspecified, not intractable, without status epilepticus: Secondary | ICD-10-CM | POA: Diagnosis not present

## 2014-05-28 DIAGNOSIS — F319 Bipolar disorder, unspecified: Secondary | ICD-10-CM | POA: Diagnosis not present

## 2014-05-28 DIAGNOSIS — K219 Gastro-esophageal reflux disease without esophagitis: Secondary | ICD-10-CM | POA: Diagnosis not present

## 2014-05-28 DIAGNOSIS — R079 Chest pain, unspecified: Secondary | ICD-10-CM | POA: Diagnosis present

## 2014-05-28 DIAGNOSIS — Z79899 Other long term (current) drug therapy: Secondary | ICD-10-CM | POA: Diagnosis not present

## 2014-05-28 DIAGNOSIS — I129 Hypertensive chronic kidney disease with stage 1 through stage 4 chronic kidney disease, or unspecified chronic kidney disease: Secondary | ICD-10-CM | POA: Diagnosis not present

## 2014-05-28 DIAGNOSIS — F209 Schizophrenia, unspecified: Secondary | ICD-10-CM | POA: Insufficient documentation

## 2014-05-28 DIAGNOSIS — Z87820 Personal history of traumatic brain injury: Secondary | ICD-10-CM | POA: Insufficient documentation

## 2014-05-28 DIAGNOSIS — N182 Chronic kidney disease, stage 2 (mild): Secondary | ICD-10-CM | POA: Insufficient documentation

## 2014-05-28 HISTORY — DX: Disorder of kidney and ureter, unspecified: N28.9

## 2014-05-28 LAB — CBC
HEMATOCRIT: 40.4 % (ref 39.0–52.0)
HEMOGLOBIN: 14.1 g/dL (ref 13.0–17.0)
MCH: 30.5 pg (ref 26.0–34.0)
MCHC: 34.9 g/dL (ref 30.0–36.0)
MCV: 87.4 fL (ref 78.0–100.0)
Platelets: 222 10*3/uL (ref 150–400)
RBC: 4.62 MIL/uL (ref 4.22–5.81)
RDW: 13.2 % (ref 11.5–15.5)
WBC: 6 10*3/uL (ref 4.0–10.5)

## 2014-05-28 LAB — BASIC METABOLIC PANEL
Anion gap: 8 (ref 5–15)
BUN: 19 mg/dL (ref 6–23)
CHLORIDE: 105 mmol/L (ref 96–112)
CO2: 23 mmol/L (ref 19–32)
CREATININE: 1.83 mg/dL — AB (ref 0.50–1.35)
Calcium: 8.8 mg/dL (ref 8.4–10.5)
GFR calc Af Amer: 52 mL/min — ABNORMAL LOW (ref >90)
GFR, EST NON AFRICAN AMERICAN: 45 mL/min — AB (ref >90)
GLUCOSE: 125 mg/dL — AB (ref 70–99)
POTASSIUM: 3.6 mmol/L (ref 3.5–5.1)
Sodium: 136 mmol/L (ref 135–145)

## 2014-05-28 LAB — I-STAT TROPONIN, ED: TROPONIN I, POC: 0 ng/mL (ref 0.00–0.08)

## 2014-05-28 MED ORDER — ALUM & MAG HYDROXIDE-SIMETH 200-200-20 MG/5ML PO SUSP
30.0000 mL | Freq: Once | ORAL | Status: AC
Start: 2014-05-28 — End: 2014-05-29
  Administered 2014-05-29: 30 mL via ORAL
  Filled 2014-05-28: qty 30

## 2014-05-28 NOTE — ED Notes (Signed)
Pt is a member of a group hime; pt c/o substernal chest pain approx 20 min ago; group home worker advised that pt sat down and c/o chest pain, reports that he was diaphoretic and short of breath; pt was no longer diaphoretic or short of breath upon arrival to triage

## 2014-05-28 NOTE — ED Provider Notes (Signed)
CSN: [id number]     Arrival date & time 05/28/14  2042 History   First MD Initiated Contact with Patient 05/28/14 2308     Chief Complaint  Patient presents with  . Chest Pain     (Consider location/radiation/quality/duration/timing/severity/associated sxs/prior Treatment) Patient is a 39 y.o. male presenting with abdominal pain. The history is provided by the patient.  Abdominal Pain Pain location:  Epigastric Pain radiates to:  Does not radiate Pain severity:  Severe Onset quality:  Sudden Timing:  Constant Progression:  Unchanged Chronicity:  New Context: not alcohol use   Relieved by:  Nothing Worsened by:  Nothing tried Ineffective treatments:  None tried Associated symptoms: no flatus   Risk factors: has not had multiple surgeries     Past Medical History  Diagnosis Date  . Seizures   . Hypertension   . TBI (traumatic brain injury)   . Bipolar disorder   . Schizophrenia   . Renal disorder     chronic kidney disease, stage II   History reviewed. No pertinent past surgical history. History reviewed. No pertinent family history. History  Substance Use Topics  . Smoking status: Never Smoker   . Smokeless tobacco: Not on file  . Alcohol Use: No    Review of Systems  Gastrointestinal: Positive for abdominal pain. Negative for flatus.  Neurological: Negative for seizures.  All other systems reviewed and are negative.     Allergies  Review of patient's allergies indicates no known allergies.  Home Medications   Prior to Admission medications   Medication Sig Start Date End Date Taking? Authorizing Provider  amLODipine (NORVASC) 10 MG tablet Take 10 mg by mouth daily.   Yes Historical Provider, MD  clonazePAM (KLONOPIN) 0.5 MG tablet Take 0.5 mg by mouth 2 (two) times daily as needed for anxiety.    Yes Historical Provider, MD  glycopyrrolate (ROBINUL) 2 MG tablet Take 2 mg by mouth 3 (three) times daily.    Yes Historical Provider, MD  levETIRAcetam  (KEPPRA) 500 MG tablet Take 500 mg by mouth 2 (two) times daily.   Yes Historical Provider, MD  Lurasidone HCl (LATUDA) 60 MG TABS Take 1 tablet by mouth at bedtime.   Yes Historical Provider, MD  metoprolol tartrate (LOPRESSOR) 25 MG tablet Take 25 mg by mouth 2 (two) times daily.    Yes Historical Provider, MD  mirtazapine (REMERON) 15 MG tablet Take 15 mg by mouth at bedtime.   Yes Historical Provider, MD   BP 132/85 mmHg  Pulse 72  Temp(Src) 97.9 F (36.6 C) (Oral)  Resp 18  SpO2 100% Physical Exam  Constitutional: He is oriented to person, place, and time. He appears well-developed and well-nourished. No distress.  HENT:  Head: Normocephalic and atraumatic.  Mouth/Throat: Oropharynx is clear and moist.  Eyes: EOM are normal. Pupils are equal, round, and reactive to light.  Neck: Normal range of motion. Neck supple.  Cardiovascular: Normal rate and regular rhythm.   Pulmonary/Chest: Effort normal and breath sounds normal. No respiratory distress. He has no wheezes. He has no rales.  Abdominal: Soft. Bowel sounds are increased. There is no tenderness. There is no rebound, no guarding, no tenderness at McBurney's point and negative Murphy's sign.  Musculoskeletal: Normal range of motion.  Neurological: He is alert and oriented to person, place, and time.  Skin: Skin is warm and dry.  Psychiatric: He has a normal mood and affect.    ED Course  Procedures (including critical care time) Labs Review  Labs Reviewed  BASIC METABOLIC PANEL - Abnormal; Notable for the following:    Glucose, Bld 125 (*)    Creatinine, Ser 1.83 (*)    GFR calc non Af Amer 45 (*)    GFR calc Af Amer 52 (*)    All other components within normal limits  CBC  I-STAT TROPOININ, ED    Imaging Review No results found.   EKG Interpretation   Date/Time:  Tuesday May 28 2014 20:48:18 EDT Ventricular Rate:  69 PR Interval:  176 QRS Duration: 96 QT Interval:  372 QTC Calculation: 398 R Axis:    51 Text Interpretation:  Sinus rhythm Abnormal R-wave progression, early  transition Borderline T abnormalities, inferior leads Baseline wander in  lead(s) V3 Since last tracing rate slower Confirmed by KNAPP  MD-J, JON  ([id number]) on 05/28/2014 8:53:46 PM      MDM   Final diagnoses:  None    Ruled out for ACS.  Symptoms consistent with GERD.  Will treat.  Group home worker is concerned about medications follow up with [facility] in am    Anavi Branscum, MD 05/29/14 [phone]

## 2014-05-29 ENCOUNTER — Emergency Department (HOSPITAL_COMMUNITY): Payer: Medicaid Other

## 2014-05-29 ENCOUNTER — Encounter (HOSPITAL_COMMUNITY): Payer: Self-pay | Admitting: Emergency Medicine

## 2014-05-29 LAB — I-STAT TROPONIN, ED: Troponin i, poc: 0 ng/mL (ref 0.00–0.08)

## 2014-05-29 MED ORDER — OMEPRAZOLE 20 MG PO CPDR: 20.0000 mg | DELAYED_RELEASE_CAPSULE | Freq: Every day | ORAL | Status: DC

## 2014-05-29 NOTE — ED Notes (Signed)
Patient's caregiver repeatedly asks staff, "How much longer is it gonna be?"  Delay explained.

## 2014-06-16 ENCOUNTER — Emergency Department (HOSPITAL_COMMUNITY)
Admission: EM | Admit: 2014-06-16 | Discharge: 2014-06-28 | Disposition: A | Discharge status: Home / Self Care | Payer: Medicaid Other | Attending: Emergency Medicine | Admitting: Emergency Medicine

## 2014-06-16 ENCOUNTER — Encounter (HOSPITAL_COMMUNITY): Payer: Self-pay | Admitting: Emergency Medicine

## 2014-06-16 DIAGNOSIS — F209 Schizophrenia, unspecified: Secondary | ICD-10-CM | POA: Diagnosis present

## 2014-06-16 DIAGNOSIS — Z87820 Personal history of traumatic brain injury: Secondary | ICD-10-CM | POA: Insufficient documentation

## 2014-06-16 DIAGNOSIS — R456 Violent behavior: Secondary | ICD-10-CM | POA: Diagnosis not present

## 2014-06-16 DIAGNOSIS — F319 Bipolar disorder, unspecified: Secondary | ICD-10-CM | POA: Diagnosis not present

## 2014-06-16 DIAGNOSIS — Z008 Encounter for other general examination: Secondary | ICD-10-CM | POA: Diagnosis not present

## 2014-06-16 DIAGNOSIS — I129 Hypertensive chronic kidney disease with stage 1 through stage 4 chronic kidney disease, or unspecified chronic kidney disease: Secondary | ICD-10-CM | POA: Insufficient documentation

## 2014-06-16 DIAGNOSIS — Z046 Encounter for general psychiatric examination, requested by authority: Secondary | ICD-10-CM

## 2014-06-16 DIAGNOSIS — F4325 Adjustment disorder with mixed disturbance of emotions and conduct: Secondary | ICD-10-CM | POA: Diagnosis not present

## 2014-06-16 DIAGNOSIS — G40909 Epilepsy, unspecified, not intractable, without status epilepticus: Secondary | ICD-10-CM | POA: Insufficient documentation

## 2014-06-16 DIAGNOSIS — N182 Chronic kidney disease, stage 2 (mild): Secondary | ICD-10-CM | POA: Insufficient documentation

## 2014-06-16 DIAGNOSIS — Z79899 Other long term (current) drug therapy: Secondary | ICD-10-CM | POA: Diagnosis not present

## 2014-06-16 LAB — ETHANOL

## 2014-06-16 LAB — RAPID URINE DRUG SCREEN, HOSP PERFORMED
AMPHETAMINES: NOT DETECTED
BENZODIAZEPINES: NOT DETECTED
Barbiturates: NOT DETECTED
COCAINE: NOT DETECTED
Opiates: NOT DETECTED
TETRAHYDROCANNABINOL: NOT DETECTED

## 2014-06-16 LAB — COMPREHENSIVE METABOLIC PANEL
ALK PHOS: 56 U/L (ref 39–117)
ALT: 18 U/L (ref 0–53)
AST: 20 U/L (ref 0–37)
Albumin: 4.6 g/dL (ref 3.5–5.2)
Anion gap: 9 (ref 5–15)
BILIRUBIN TOTAL: 0.5 mg/dL (ref 0.3–1.2)
BUN: 14 mg/dL (ref 6–23)
CALCIUM: 9.4 mg/dL (ref 8.4–10.5)
CO2: 26 mmol/L (ref 19–32)
CREATININE: 1.57 mg/dL — AB (ref 0.50–1.35)
Chloride: 107 mmol/L (ref 96–112)
GFR calc Af Amer: 63 mL/min — ABNORMAL LOW (ref >90)
GFR calc non Af Amer: 54 mL/min — ABNORMAL LOW (ref >90)
Glucose, Bld: 104 mg/dL — ABNORMAL HIGH (ref 70–99)
Potassium: 4.6 mmol/L (ref 3.5–5.1)
Sodium: 142 mmol/L (ref 135–145)
TOTAL PROTEIN: 8.2 g/dL (ref 6.0–8.3)

## 2014-06-16 LAB — CBC
HEMATOCRIT: 44.5 % (ref 39.0–52.0)
Hemoglobin: 15.2 g/dL (ref 13.0–17.0)
MCH: 30.5 pg (ref 26.0–34.0)
MCHC: 34.2 g/dL (ref 30.0–36.0)
MCV: 89.4 fL (ref 78.0–100.0)
Platelets: 237 10*3/uL (ref 150–400)
RBC: 4.98 MIL/uL (ref 4.22–5.81)
RDW: 13.1 % (ref 11.5–15.5)
WBC: 5 10*3/uL (ref 4.0–10.5)

## 2014-06-16 LAB — ACETAMINOPHEN LEVEL

## 2014-06-16 LAB — SALICYLATE LEVEL: Salicylate Lvl: <4 mg/dL (ref 2.8–20.0)

## 2014-06-16 MED ORDER — LORAZEPAM 1 MG PO TABS: 1.0000 mg | ORAL_TABLET | Freq: Three times a day (TID) | ORAL | Status: DC | PRN

## 2014-06-16 MED ORDER — ONDANSETRON HCL 4 MG PO TABS: 4.0000 mg | ORAL_TABLET | Freq: Three times a day (TID) | ORAL | Status: DC | PRN

## 2014-06-16 MED ORDER — DIVALPROEX SODIUM ER 250 MG PO TB24
250.0000 mg | ORAL_TABLET | Freq: Two times a day (BID) | ORAL | Status: DC
  Administered 2014-06-16 – 2014-06-17 (×2): 250 mg via ORAL
  Filled 2014-06-16 (×3): qty 1

## 2014-06-16 MED ORDER — ALUM & MAG HYDROXIDE-SIMETH 200-200-20 MG/5ML PO SUSP: 30.0000 mL | ORAL | Status: DC | PRN

## 2014-06-16 MED ORDER — NICOTINE 21 MG/24HR TD PT24
21.0000 mg | MEDICATED_PATCH | Freq: Every day | TRANSDERMAL | Status: DC
  Filled 2014-06-16 (×2): qty 1

## 2014-06-16 MED ORDER — ACETAMINOPHEN 325 MG PO TABS: 650.0000 mg | ORAL_TABLET | ORAL | Status: DC | PRN

## 2014-06-16 MED ORDER — LURASIDONE HCL 40 MG PO TABS
60.0000 mg | ORAL_TABLET | Freq: Every day | ORAL | Status: DC
  Administered 2014-06-16 – 2014-06-27 (×12): 60 mg via ORAL
  Filled 2014-06-16 (×15): qty 2

## 2014-06-16 MED ORDER — CLONAZEPAM 0.5 MG PO TABS
0.5000 mg | ORAL_TABLET | Freq: Two times a day (BID) | ORAL | Status: DC | PRN
  Administered 2014-06-22 – 2014-06-26 (×2): 0.5 mg via ORAL
  Filled 2014-06-16 (×2): qty 1

## 2014-06-16 MED ORDER — AMLODIPINE BESYLATE 10 MG PO TABS
10.0000 mg | ORAL_TABLET | Freq: Every day | ORAL | Status: DC
  Administered 2014-06-16 – 2014-06-28 (×13): 10 mg via ORAL
  Filled 2014-06-16 (×13): qty 1

## 2014-06-16 MED ORDER — IBUPROFEN 200 MG PO TABS: 600.0000 mg | ORAL_TABLET | Freq: Three times a day (TID) | ORAL | Status: DC | PRN

## 2014-06-16 MED ORDER — MIRTAZAPINE 30 MG PO TABS
15.0000 mg | ORAL_TABLET | Freq: Every day | ORAL | Status: DC
  Administered 2014-06-16 – 2014-06-27 (×12): 15 mg via ORAL
  Filled 2014-06-16 (×12): qty 1

## 2014-06-16 MED ORDER — METOPROLOL TARTRATE 25 MG PO TABS
25.0000 mg | ORAL_TABLET | Freq: Two times a day (BID) | ORAL | Status: DC
  Administered 2014-06-16 – 2014-06-28 (×24): 25 mg via ORAL
  Filled 2014-06-16 (×24): qty 1

## 2014-06-16 MED ORDER — PANTOPRAZOLE SODIUM 40 MG PO TBEC
40.0000 mg | DELAYED_RELEASE_TABLET | Freq: Every day | ORAL | Status: DC
  Administered 2014-06-16 – 2014-06-28 (×13): 40 mg via ORAL
  Filled 2014-06-16 (×13): qty 1

## 2014-06-16 MED ORDER — LEVETIRACETAM 500 MG PO TABS
500.0000 mg | ORAL_TABLET | Freq: Two times a day (BID) | ORAL | Status: DC
  Administered 2014-06-16 – 2014-06-28 (×24): 500 mg via ORAL
  Filled 2014-06-16 (×27): qty 1

## 2014-06-16 MED ORDER — GLYCOPYRROLATE 1 MG PO TABS
2.0000 mg | ORAL_TABLET | Freq: Three times a day (TID) | ORAL | Status: DC
  Administered 2014-06-16 – 2014-06-28 (×36): 2 mg via ORAL
  Filled 2014-06-16 (×39): qty 2

## 2014-06-16 NOTE — ED Notes (Signed)
Patient has 1 bag of soiled clothing and personal items. Patient and belongings have been wanded by security.
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
## 2014-06-17 DIAGNOSIS — Z87820 Personal history of traumatic brain injury: Secondary | ICD-10-CM | POA: Diagnosis not present

## 2014-06-17 DIAGNOSIS — F319 Bipolar disorder, unspecified: Secondary | ICD-10-CM

## 2014-06-17 DIAGNOSIS — R456 Violent behavior: Secondary | ICD-10-CM | POA: Diagnosis not present

## 2014-06-17 DIAGNOSIS — F209 Schizophrenia, unspecified: Secondary | ICD-10-CM

## 2014-06-17 DIAGNOSIS — F4325 Adjustment disorder with mixed disturbance of emotions and conduct: Secondary | ICD-10-CM | POA: Diagnosis not present

## 2014-06-17 MED ORDER — DIVALPROEX SODIUM ER 500 MG PO TB24
500.0000 mg | ORAL_TABLET | Freq: Two times a day (BID) | ORAL | Status: DC
  Administered 2014-06-17 – 2014-06-28 (×22): 500 mg via ORAL
  Filled 2014-06-17 (×24): qty 1

## 2014-06-17 NOTE — ED Notes (Signed)
Patient provided snack with evening medications.  Q 15 safety checks continue.
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
## 2014-06-17 NOTE — ED Notes (Signed)
Patient appears pleasant, cooperative. Denies SI, HI, AVH. Denies anxiety and any feelings of depression. Has no complaints or needs at present.  Encouragement offered.   Q 15 safety checks continue.
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
## 2014-06-18 DIAGNOSIS — R456 Violent behavior: Secondary | ICD-10-CM | POA: Diagnosis not present

## 2014-06-18 DIAGNOSIS — F4325 Adjustment disorder with mixed disturbance of emotions and conduct: Secondary | ICD-10-CM | POA: Diagnosis present

## 2014-06-18 DIAGNOSIS — F209 Schizophrenia, unspecified: Secondary | ICD-10-CM | POA: Diagnosis not present
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
## 2014-06-18 NOTE — ED Notes (Signed)
Pt AAO x 3, no distress noted, watching TV at present, conversing freely. Denies SI, HI or AV hallucinations. Will continue to monitor for safety, Q 15 min checks in effect.

## 2014-06-18 NOTE — ED Notes (Addendum)
Pt resting at present, no distress noted, calm & cooperative, will continue to monitor for safety. Q 15 min checks in effect.
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
## 2014-06-19 DIAGNOSIS — F4325 Adjustment disorder with mixed disturbance of emotions and conduct: Secondary | ICD-10-CM

## 2014-06-19 DIAGNOSIS — R456 Violent behavior: Secondary | ICD-10-CM

## 2014-06-19 DIAGNOSIS — Z046 Encounter for general psychiatric examination, requested by authority: Secondary | ICD-10-CM | POA: Insufficient documentation

## 2014-06-19 DIAGNOSIS — F209 Schizophrenia, unspecified: Secondary | ICD-10-CM | POA: Insufficient documentation

## 2014-06-19 NOTE — ED Notes (Signed)
Patient has been resting quietly.  He has been calm and cooperative.  He denies SI/HI/AVH.

## 2014-06-19 NOTE — ED Notes (Signed)
Pt sleeping at present, no distress noted, respirations even & unlabored, Monitoring for safety, Q checks in effect.

## 2014-06-19 NOTE — ED Notes (Signed)
Pt sleeping at present, no distress noted, respirations even and unlabored.  Monitoring for safety, Q 15 min checks in effect. 
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
## 2014-06-19 NOTE — ED Notes (Signed)
Pt AAO x 3, no distress noted, watching TV at present, calm & cooperative, monitoring for safety, Q 15 min checks in effect.

## 2014-06-19 NOTE — ED Notes (Signed)
Patient is calm and cooperative.  Worried about placement; asked what group home he will be going to.  Patient states he would like to go back to his mother's home.  He denies SI/HI/AVH.  Patient is compliant with his medications.  He is ambulatory and has a pleasant mood.

## 2014-06-20 NOTE — ED Notes (Signed)
Pt has been pleasant and cooperative today, denies SI/HI/AVH, [facility] that [facility] somewhere to stay and he has a place to work, no complaints, mood was bright during interaction.
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
## 2014-06-20 NOTE — ED Notes (Signed)
Pt sleeping at present, no distress noted, monitoring for safety, Q 15 min checks in effect at present.

## 2014-06-20 NOTE — Progress Notes (Signed)
[facility] Also, [facility]W called [facility] to inquire about the care coordinator referral. Hinton [facility]ifford [facility] states that the referral is still in process.  Trish MageBrittney Phenix Vandermeulen, LCSWA [phone] ED CSW 06/20/2014 11:58 PM

## 2014-06-20 NOTE — ED Notes (Signed)
Pt AAO x 3, watching TV at present, no distress noted, calm & cooperative, monitoring for safety, Q 15 min checks in effect. 

## 2014-06-20 NOTE — Progress Notes (Signed)
CSW spoke with Griffith CitronNatalie of [facility] and made care coordinator referral.   Trish MageBrittney Jago Carton, LCSWA [phone] ED CSW 06/20/2014 12:12 AM

## 2014-06-21 DIAGNOSIS — R456 Violent behavior: Secondary | ICD-10-CM | POA: Diagnosis not present

## 2014-06-21 DIAGNOSIS — F209 Schizophrenia, unspecified: Secondary | ICD-10-CM | POA: Diagnosis not present

## 2014-06-21 DIAGNOSIS — F4325 Adjustment disorder with mixed disturbance of emotions and conduct: Secondary | ICD-10-CM | POA: Diagnosis not present

## 2014-06-21 NOTE — ED Notes (Signed)
Pt. Noted sleeping in room. No complaints or concerns voiced. No distress or abnormal behavior noted. Will continue to monitor with security cameras. Q 15 minute rounds continue. 

## 2014-06-21 NOTE — ED Notes (Signed)
Pt. Noted in room. No complaints or concerns voiced. No distress noted. Will continue to monitor with security cameras. Q 15 minute rounds continue. 

## 2014-06-21 NOTE — ED Notes (Signed)
Pt. Noted in room. No complaints or concerns voiced. No distress or abnormal behavior noted. Will continue to monitor with security cameras. Q 15 minute rounds continue. 

## 2014-06-21 NOTE — ED Notes (Signed)
Pt. Loudly refusing medications and vital signs.

## 2014-06-21 NOTE — ED Notes (Signed)
Report received from Angela RN. Pt. Alert and oriented in no distress denies SI, HI, AVH and pain.  Pt. Instructed to come to me with problems or concerns.Will continue to monitor for safety via security cameras and Q 15 minute checks. 
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
## 2014-06-22 NOTE — ED Notes (Signed)
Pt. Noted sleeping in room. No complaints or concerns voiced. No distress or abnormal behavior noted. Will continue to monitor with security cameras. Q 15 minute rounds continue. 

## 2014-06-22 NOTE — ED Notes (Signed)
Pt. Noted sleeping in room. No complaints or concerns voiced. No distress or abnormal behavior noted. Will continue to monitor with security cameras. Q 15 minute rounds continue.l 

## 2014-06-22 NOTE — ED Notes (Signed)
[facility]. Pt observed resting quietly, with eyes closed. Will continue to monitor for needs/safety.

## 2014-06-22 NOTE — ED Notes (Signed)
Pt acuity note: Low Pt has been resting quietly with no acute behavioral issues noted. Will continue to monitor for needs/safety.

## 2014-06-22 NOTE — ED Notes (Signed)
Pt. Alert and oriented in no distress denies SI, HI, AVH and pain.  Pt. Instructed to come to me with problems or concerns.Will continue to monitor for safety via security cameras and Q 15 minute checks. 

## 2014-06-22 NOTE — ED Notes (Signed)
Pt. Noted in room. No distress or abnormal behavior noted. Will continue to monitor with security cameras. Q 15 minute rounds continue. 
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
## 2014-06-22 NOTE — ED Notes (Signed)
Pt. Noted in room. No complaints or concerns voiced. No distress or abnormal behavior noted. Will continue to monitor with security cameras. Q 15 minute rounds continue. 

## 2014-06-22 NOTE — ED Notes (Signed)
Pt acuity note: Low Pt is currently calm and resting in bed, watching TV. Will continue to monitor for needs/safety.

## 2014-06-22 NOTE — ED Notes (Signed)
Report received from [name] RN. Pt. Sleeping, respirations regular and unlabored. Will continue to monitor for safety via security cameras and Q 15 minute checks. 

## 2014-06-22 NOTE — ED Notes (Signed)
Pt. Agreed to take previously refused meds.

## 2014-06-23 NOTE — ED Notes (Signed)
Pt. Noted sleeping in room. No complaints or concerns voiced. No distress or abnormal behavior noted. Will continue to monitor with security cameras. Q 15 minute rounds continue. 

## 2014-06-23 NOTE — ED Notes (Signed)
Acuity note: Low Pt observed resting quietly, with regular/even respirations. No acute behavior issues noted. Will continue to monitor for needs/safety.   

## 2014-06-23 NOTE — ED Notes (Signed)
Pt acuity: Low Pt has been calm and cooperative.   

## 2014-06-23 NOTE — ED Notes (Signed)
Pt. Noted resting in room. No complaints or concerns voiced. No distress or abnormal behavior noted. Will continue to monitor with security cameras. Q 15 minute rounds continue. 

## 2014-06-23 NOTE — ED Notes (Signed)
Pt acuity note: Low Pt is pleasant and cooperative.

## 2014-06-23 NOTE — ED Notes (Signed)
Report received from Glen NoonKaren Shugart RN. Pt. Alert in no distress denies SI, HI, AVH and pain.  Pt. Instructed to come to me with problems or concerns.Will continue to monitor for safety via security cameras and Q 15 minute checks.
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
## 2014-06-24 NOTE — ED Notes (Signed)
Pt. Noted sleeping in room. No complaints or concerns voiced. No distress or abnormal behavior noted. Will continue to monitor with security cameras. Q 15 minute rounds continue. 
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
## 2014-06-24 NOTE — ED Notes (Signed)
Pt AAO x 3, watching TV at present, no distress noted, monitoring for safety, Q 15 min checks in effect at present.

## 2014-06-24 NOTE — ED Notes (Signed)
Acuity low. 

## 2014-06-24 NOTE — Progress Notes (Addendum)
[facility], patient did not suffer a TBI on 09/29/2010. 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 [facility], patient has history of hypertension, cerebral vascular accident, and seizure disorder.   Pt mother elaborated further regarding patient history. [facility], and in foster care. Pt was not in pt mother custody as an adolescent due to patient mother's own personal issues. 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 CSW trying to track down documentation to help support care cooridnation referral and approriate placement.   CSW contacting group homes specializing in MI/IDD in order to meet both pt needs.   [facility]    Olga CoasterKristen Manroop Jakubowicz, [place]  Clinical Social Work  [facility] [phone]

## 2014-06-25 DIAGNOSIS — F4325 Adjustment disorder with mixed disturbance of emotions and conduct: Secondary | ICD-10-CM | POA: Diagnosis not present

## 2014-06-25 DIAGNOSIS — F209 Schizophrenia, unspecified: Secondary | ICD-10-CM | POA: Diagnosis not present

## 2014-06-25 DIAGNOSIS — R456 Violent behavior: Secondary | ICD-10-CM | POA: Diagnosis not present

## 2014-06-25 MED ORDER — TUBERCULIN PPD 5 UNIT/0.1ML ID SOLN
5.0000 [IU] | Freq: Once | INTRADERMAL | Status: AC
  Administered 2014-06-25: 5 [IU] via INTRADERMAL
  Filled 2014-06-25: qty 0.1

## 2014-06-25 NOTE — ED Notes (Signed)
Patient denies SI, HI, AVH. Denies feelings of anxiety. Reports depression related to being in the hospital. States that he wants to go home. Reports that he called his mother Malachi BondsGloria today. States that his mother is his guardian. States that he spends every other weekend with his mom.  Encouragement offered. Snack provided.  Q 15 safety checks in place.

## 2014-06-25 NOTE — ED Notes (Signed)
Patient MR.  Acuity low to moderate.

## 2014-06-25 NOTE — Progress Notes (Signed)
[facility], where pt received services in the past, however no files in the system. Per April, [facility] location closed, where pt was, and does not have access to those files.  CSW discussed with CSW director, at this time no evidence for I/DD diagnosis.  No evidence for TBI diagnosis.   Pt pending care coordinator for history of schizophrenia.   CSW contacted [facility], and spoke with connie moore. [name] reviewed patient fl2 and clinical information. Patient has been potenitally approved pending tb skin test and pasarr number.   Glen CoasterKristen Janyah Singleterry, LCSW  Clinical Social Work  [facility] [phone]   Addendum 1452pm   CSW obtained pasarr number [id number] O. TB skin test has been placed in left forearm. CSW called [facility] at [phone] to follow up with Ms. Moore and left message. CSW left message for Ms. Moore on her cell at [phone].   Glen CoasterKristen Nickson Middlesworth, LCSW  Clinical Social Work  [facility] [phone]
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
## 2014-06-26 DIAGNOSIS — R456 Violent behavior: Secondary | ICD-10-CM | POA: Diagnosis not present

## 2014-06-26 DIAGNOSIS — F4325 Adjustment disorder with mixed disturbance of emotions and conduct: Secondary | ICD-10-CM | POA: Diagnosis not present

## 2014-06-26 DIAGNOSIS — F209 Schizophrenia, unspecified: Secondary | ICD-10-CM | POA: Diagnosis not present

## 2014-06-26 NOTE — ED Notes (Signed)
Patient pleasant, cooperative. Engages Clinical research associatewriter. Denies complaint. Denies SI, HI, AVH. Reports feelings depressed because he is still in the hospital.   Encouragement offered.  Q 15 safety checks continue.
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
## 2014-06-27 NOTE — ED Notes (Signed)
Patient pleasant, cooperative. More interaction on the unit noted. Denies complaint. No SI, HI, AVH.  Encouragement offered.   Q 15 safety checks in place.

## 2014-06-27 NOTE — ED Notes (Signed)
PPD negative.  Noted in shadow chart.

## 2014-06-27 NOTE — ED Notes (Signed)
Acuity low. 
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
## 2014-06-27 NOTE — Progress Notes (Addendum)
CSW spoke with Glen Sart[facility]iusConnie Johnson, [facility] [phone][facility] who is considering patient for placement. Ms. Christell ConstantMoore would like to speak with pt guardian/mother Glen Johnson [phone]) to complete paperwork and to answer any questions. CSW provided Ms. Johnson with pt guardian/mother contact. CSW also spoke with pt guardian/mother who is in agreement of plan. Pt mother/guaridan and group home trying to arrange something for today in order for patient to be admitted to group home today.   Glen CoasterKristen Ayianna Darnold, LCSW  Clinical Social Work  [facility] [phone]

## 2014-06-27 NOTE — Progress Notes (Signed)
Pt mother/guardian meeting with [facility] to sign admission paperwork. Pt anticipated to discharge to group home tomorrow morning.   Olga CoasterKristen Odell Choung, LCSW  Clinical Social Work  [facility] [phone]

## 2014-06-28 MED ORDER — GLYCOPYRROLATE 1 MG PO TABS: 2.0000 mg | ORAL_TABLET | Freq: Three times a day (TID) | ORAL | Status: DC

## 2014-06-28 MED ORDER — PANTOPRAZOLE SODIUM 40 MG PO TBEC: 40.0000 mg | DELAYED_RELEASE_TABLET | Freq: Every day | ORAL | Status: DC

## 2014-06-28 MED ORDER — METOPROLOL TARTRATE 25 MG PO TABS: 25.0000 mg | ORAL_TABLET | Freq: Two times a day (BID) | ORAL | Status: DC

## 2014-06-28 MED ORDER — LURASIDONE HCL 60 MG PO TABS: 60.0000 mg | ORAL_TABLET | Freq: Every day | ORAL | Status: DC

## 2014-06-28 MED ORDER — AMLODIPINE BESYLATE 10 MG PO TABS: 10.0000 mg | ORAL_TABLET | Freq: Every day | ORAL | Status: DC

## 2014-06-28 MED ORDER — CLONAZEPAM 0.5 MG PO TABS: 0.5000 mg | ORAL_TABLET | Freq: Two times a day (BID) | ORAL | Status: DC | PRN

## 2014-06-28 MED ORDER — MIRTAZAPINE 15 MG PO TABS: 15.0000 mg | ORAL_TABLET | Freq: Every day | ORAL | Status: DC

## 2014-06-28 MED ORDER — DIVALPROEX SODIUM ER 500 MG PO TB24: 500.0000 mg | ORAL_TABLET | Freq: Two times a day (BID) | ORAL | Status: DC

## 2014-06-28 MED ORDER — LEVETIRACETAM 500 MG PO TABS: 500.0000 mg | ORAL_TABLET | Freq: Two times a day (BID) | ORAL | Status: DC
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
## 2014-06-28 NOTE — Progress Notes (Signed)
CSW spoke with patient guardian/mother, Fredrich RomansGloria Friesen, who states that due to transportation issues she was unable to make it to the group home to sign admissions papers. Pt mother/guardian states she is going to ask her aid to drive her to the group home this morning. CSW advised guardian/mother to let CSW know if transportation is still an issue as patient is ready for discharge to group home today.   Olga CoasterKristen Margia Wiesen, LCSW  Clinical Social Work  [facility] [phone]

## 2014-06-28 NOTE — [facility] Suicide Risk Assessment (Cosign Needed)
Suicide Risk Assessment  Discharge Assessment   [facility]   Demographic Factors:  Male, Low socioeconomic status and Unemployed  Total Time spent with patient: 20 minutes  Musculoskeletal: Strength & Muscle Tone: within normal limits Gait & Station: normal Patient leans: N/A  Psychiatric Specialty Exam:     Blood pressure 102/54, pulse 62, temperature 98.1 F (36.7 C), temperature source Oral, resp. rate 16, SpO2 100 %.There is no weight on file to calculate BMI.  General Appearance: Casual and Fairly Groomed  Eye Contact::  Good  Speech:  Clear and Coherent409  Volume:  Normal  Mood:  Anxious  Affect:  Congruent  Thought Process:  Coherent, Goal Directed and Intact  Orientation:  Full (Time, Place, and Person)  Thought Content:  WDL  Suicidal Thoughts:  No  Homicidal Thoughts:  No  Memory:  Immediate;   Good Recent;   Fair Remote;   Fair  Judgement:  Fair  Insight:  Fair  Psychomotor Activity:  Normal  Concentration:  Good  Recall:  NA  Fund of Knowledge:Fair  Language: Good  Akathisia:  NA  Handed:  Right  AIMS (if indicated):     Assets:  Desire for Improvement  Sleep:     Cognition: Impaired,  Moderate  ADL's:  Intact      Has this patient used any form of tobacco in the last 30 days? (Cigarettes, Smokeless Tobacco, Cigars, and/or Pipes) N/A  Mental Status Per Nursing Assessment::   On Admission:     Current Mental Status by Physician: NA  Loss Factors: NA  Historical Factors: NA  Risk Reduction Factors:   Living with another person, especially a relative and Positive social support  Continued Clinical Symptoms:  Bipolar Disorder:   Depressive phase Depression:   Insomnia  Cognitive Features That Contribute To Risk:  Polarized thinking    Suicide Risk:  Minimal: No identifiable suicidal ideation.  Patients presenting with no risk factors but with morbid ruminations; may be classified as minimal risk based on  the severity of the depressive symptoms  Principal Problem: Schizophrenia Discharge Diagnoses:  Patient Active Problem List   Diagnosis Date Noted  . Schizophrenia, unspecified type [F20.9]   . Involuntary commitment [Z04.6]   . Violent behavior [R45.6]   . Adjustment disorder with mixed disturbance of emotions and conduct [F43.25] 06/18/2014    Follow-up Information    Follow up with Lonia BloodGARBA,LAWAL, MD.   Specialty:  Internal Medicine   Why:  This [facility] the [facility] [place] [facility].  If this is not your preferred pcp contact DSS [phone]    Contact information:   [address] [place] [address] [phone]       Plan Of Care/Follow-up recommendations:  Activity:  as tolerated Diet:  regular  Is patient on multiple antipsychotic therapies at discharge:  No   Has Patient had three or more failed trials of antipsychotic monotherapy by history:  No  Recommended Plan for Multiple Antipsychotic Therapies: NA    Joni Norrod C   PMHNP-BC 06/28/2014, 1:02 PM

## 2014-06-28 NOTE — ED Notes (Signed)
Acuity low. 

## 2014-07-11 NOTE — Progress Notes (Signed)
CSW received call from [facility], pt tb skin test documentation was not received. CSW re faxed documentation at [phone] as requested. Received fax confirmation.   Olga CoasterKristen Javius Sylla, LCSW  Clinical Social Work  [facility] [phone]

## 2014-10-02 ENCOUNTER — Other Ambulatory Visit: Payer: Self-pay | Admitting: Endocrinology

## 2014-12-11 ENCOUNTER — Telehealth: Payer: Self-pay | Admitting: *Deleted

## 2014-12-11 ENCOUNTER — Ambulatory Visit: Payer: Medicaid Other | Admitting: Neurology

## 2014-12-11 NOTE — Telephone Encounter (Signed)
No showed new patient appt 

## 2014-12-30 ENCOUNTER — Ambulatory Visit (INDEPENDENT_AMBULATORY_CARE_PROVIDER_SITE_OTHER): Payer: Medicaid Other | Admitting: Neurology

## 2014-12-30 ENCOUNTER — Encounter: Payer: Self-pay | Admitting: Neurology

## 2014-12-30 VITALS — BP 107/79 | HR 64 | Ht 69.0 in | Wt 206.0 lb

## 2014-12-30 DIAGNOSIS — R569 Unspecified convulsions: Secondary | ICD-10-CM | POA: Diagnosis not present

## 2014-12-30 DIAGNOSIS — Z87820 Personal history of traumatic brain injury: Secondary | ICD-10-CM | POA: Diagnosis not present

## 2014-12-30 NOTE — Progress Notes (Signed)
PATIENT: Glen Johnson DOB: August 24, 1975  Chief Complaint  Patient presents with  . Seizures    He is here with his mother, [name].  She reports his last seizure occurred six weeks ago while he was at his day progam.  Denies missing any doses of his anticonvulsant medications.     HISTORICAL  Glen Johnson is 39 yo RH with his mother, seen in refer by his primary care physician Dr.Edwin Concepcion Elk, MD in Dec 30 2014 for evaluation of seizure.  He had a history of traumatic brain injury in 2012, was treated at [facility], stating hospital for 2 and half months, required tracheostomy, PEG tube, prolonged rehabilitation, had slurred speech, gait difficulty ever since.  He had long-standing history of schizophrenia, often stayed away from his family, was found on the street, ever since the brain trauma, he seems to have more spells of seizure-like activity, sudden loss of consciousness, patient was not able to provide detailed history, he is amnestic of his traumatic brain injury event, he now lives with his mother now, reported his anger control problem, last seizure was in August 2016,   He is now on polypharmacy treatment, including Latuda 80 mg, seroquel 50 mg twice a day, Remeron 15 mg at bedtime, clonazepam, also Depakote ER 500 mg twice a day, Keppra 500 mg twice a day, BuSpar 5 mg daily  He is tolerating his medications, overall fairly stable, I have reviewed his most recent psychiatric visit, carried a diagnosis of schizophrenia, violent behavior, Adjustment disorder with mixed disturbance of emotions and conduct  REVIEW OF SYSTEMS: Full 14 system review of systems performed and notable only for weight gain, joint pain, memory loss, confusion, seizure, snoring, disinterested in activities  ALLERGIES: No Known Allergies  HOME MEDICATIONS: Current Outpatient Prescriptions  Medication Sig Dispense Refill  . amLODipine (NORVASC) 10 MG tablet Take 1  tablet (10 mg total) by mouth daily. 30 tablet 0  . busPIRone (BUSPAR) 5 MG tablet 3 (three) times daily.  2  . clonazePAM (KLONOPIN) 0.5 MG tablet Take 1 tablet (0.5 mg total) by mouth 2 (two) times daily as needed (anxiety). 30 tablet 0  . divalproex (DEPAKOTE ER) 500 MG 24 hr tablet Take 1 tablet (500 mg total) by mouth 2 (two) times daily. 60 tablet 0  . glycopyrrolate (ROBINUL) 1 MG tablet Take 2 tablets (2 mg total) by mouth 3 (three) times daily. 180 tablet 0  . LATUDA 80 MG TABS tablet at bedtime.  2  . levETIRAcetam (KEPPRA) 500 MG tablet Take 1 tablet (500 mg total) by mouth 2 (two) times daily. 60 tablet 0  . lurasidone 60 MG TABS Take 60 mg by mouth at bedtime. 30 tablet 0  . metoprolol tartrate (LOPRESSOR) 25 MG tablet Take 1 tablet (25 mg total) by mouth 2 (two) times daily. 60 tablet 0  . mirtazapine (REMERON) 15 MG tablet Take 1 tablet (15 mg total) by mouth at bedtime. 30 tablet 0  . omeprazole (PRILOSEC) 20 MG capsule Take 1 capsule (20 mg total) by mouth daily. 30 capsule 0  . QUEtiapine (SEROQUEL) 50 MG tablet 2 (two) times daily.  2   No current facility-administered medications for this visit.    PAST MEDICAL HISTORY: Past Medical History  Diagnosis Date  . Seizures (HCC)   . Hypertension   . TBI (traumatic brain injury) (HCC)   . Bipolar disorder (HCC)   . Schizophrenia (HCC)   . Renal disorder  chronic kidney disease, stage II  . Depression     PAST SURGICAL HISTORY: Past Surgical History  Procedure Laterality Date  . Tracheostomy    . Tracheostomy closure      FAMILY HISTORY: Family History  Problem Relation Age of Onset  . Seizures Mother   . Migraines Mother   . Arthritis Mother     SOCIAL HISTORY:  Social History   Social History  . Marital Status: Single    Spouse Name: N/A  . Number of Children: 2  . Years of Education: 12   Occupational History  . Disabled    Social History Main Topics  . Smoking status: Former Games developermoker  .  Smokeless tobacco: Not on file  . Alcohol Use: No     Comment: Stopped one year ago.  . Drug Use: No     Comment: Stopped one year ago.  Marland Kitchen. Sexual Activity: Not on file   Other Topics Concern  . Not on file   Social History Narrative   Lives at home with his mother.   No caffeine use.   Right-handed.     PHYSICAL EXAM   Filed Vitals:   12/30/14 1006  BP: 107/79  Pulse: 64  Height: 5\' 9"  (1.753 m)  Weight: 206 lb (93.441 kg)    Not recorded      Body mass index is 30.41 kg/(m^2).  PHYSICAL EXAMNIATION:  Gen: NAD, conversant, well nourised, obese, well groomed                     Cardiovascular: Regular rate rhythm, no peripheral edema, warm, nontender. Eyes: Conjunctivae clear without exudates or hemorrhage Neck: Supple, no carotid bruise. Pulmonary: Clear to auscultation bilaterally   NEUROLOGICAL EXAM:  MENTAL STATUS: Speech /Cognition:   Mild slurred speech, depend on his mother to provide history, not willing to cooperative on examinations   CRANIAL NERVES: CN II: Visual fields are full to confrontation. Pupils are round equal and briskly reactive to light. CN III, IV, VI: extraocular movement are normal. No ptosis. CN V: Facial sensation is intact to pinprick in all 3 divisions bilaterally. Corneal responses are intact.  CN VII: Face is symmetric with normal eye closure and smile. CN VIII: Hearing is normal to rubbing fingers CN IX, X: Palate elevates symmetrically. Phonation is normal. CN XI: Head turning and shoulder shrug are intact CN XII: Tongue is midline with normal movements and no atrophy.  MOTOR: Mild increased bilateral upper and lower extremity tone, no significant weakness  REFLEXES: Reflexes are 2+ and symmetric at the biceps, triceps, knees, and ankles. Plantar responses are flexor.  SENSORY: Intact to light touch vibratory sensations   COORDINATION:  There is no dysmetria on finger-to-nose and heel-knee-shin.     GAIT/STANCE: Wide-based, cautious, unsteady gait  DIAGNOSTIC DATA (LABS, IMAGING, TESTING) - I reviewed patient records, labs, notes, testing and imaging myself where available.   ASSESSMENT AND PLAN  Glen Johnson is a 39 y.o. male   History of traumatic brain injury Schizophrenia on polypharmacy treatment, Seizure  Depakote ER 500 mg twice a day, Keppra 500 mg twice a day   MRI of the brain with and without contrast   EEG   Return to clinic in one month  Levert FeinsteinYijun Gavinn Collard, M.D. Ph.D.  [facility] [address], [address] [place], [place] [address] Ph: [phone] Fax: [phone]  CC: Referring Provider

## 2015-01-23 ENCOUNTER — Ambulatory Visit
Admission: RE | Admit: 2015-01-23 | Discharge: 2015-01-23 | Disposition: A | Discharge status: Home / Self Care | Payer: Medicaid Other | Source: Ambulatory Visit | Attending: Neurology | Admitting: Neurology

## 2015-01-23 DIAGNOSIS — R569 Unspecified convulsions: Secondary | ICD-10-CM

## 2015-01-23 DIAGNOSIS — Z87820 Personal history of traumatic brain injury: Secondary | ICD-10-CM

## 2015-01-23 MED ORDER — GADOBENATE DIMEGLUMINE 529 MG/ML IV SOLN
20.0000 mL | Freq: Once | INTRAVENOUS | Status: AC | PRN
  Administered 2015-01-23: 20 mL via INTRAVENOUS

## 2015-01-27 ENCOUNTER — Telehealth: Payer: Self-pay | Admitting: *Deleted

## 2015-01-27 NOTE — Telephone Encounter (Signed)
-----   Message from Levert FeinsteinYijun Yan, MD sent at 01/27/2015  1:40 PM EST ----- Please call pt for normal MRI brain.

## 2015-01-27 NOTE — Telephone Encounter (Signed)
I called and spoke to mother, Malachi [place]gloria [place] that pts MRI brain was normal study.  She verbalized understanding.

## 2015-01-31 ENCOUNTER — Telehealth: Payer: Self-pay | Admitting: Neurology

## 2015-01-31 NOTE — Telephone Encounter (Signed)
Mother returned call. Advised Mother that phone call was regarding EEG appointment on 02/10/15 that needs to be changed due to tech being out of the office. Mother states patient does not have appointment until March. I advised Mother that appointment in March is for her. Mother states Son just had study done 01/23/15 and it was fine. I advised that was a different type of test. EEG was rescheduled to 03/05/15, advised we also need to change follow up appointment with Dr. Terrace ArabiaYan 12/5. Mother states she didn't know anything about these appointments, all she has is the March appointment and that appointment is to be left the way that it is. I advised again that March appointment is for her, not for her Son. Patient refuses to reschedule appointment with Dr. Terrace ArabiaYan, states we are not going to have her running back and forth over here and we had better do both at the same time. I advised that I will send message to nurse and have nurse call her. Mother continued to yell at me, I advised that I am trying to help her, however if she is going to continue to fuss with me, I would need to discontinue the call. Mother states "that's what your mouth says", I again advised that I am trying to help her, I will give a message to the nurse and asked what phone number she would like for nurse to call her back on and patient said "you should know, you're the one who called me". I advised that another person had called, I received her call today and verified number that was on caller ID and in contact phone information, advised I will forward to nurse to call her back and then disconnected call.

## 2015-02-10 ENCOUNTER — Other Ambulatory Visit: Payer: Medicaid Other

## 2015-02-17 ENCOUNTER — Ambulatory Visit (INDEPENDENT_AMBULATORY_CARE_PROVIDER_SITE_OTHER): Payer: Medicaid Other | Admitting: Neurology

## 2015-02-17 ENCOUNTER — Encounter: Payer: Self-pay | Admitting: Neurology

## 2015-02-17 VITALS — BP 112/72 | HR 68 | Ht 69.0 in | Wt 205.0 lb

## 2015-02-17 DIAGNOSIS — R269 Unspecified abnormalities of gait and mobility: Secondary | ICD-10-CM | POA: Insufficient documentation

## 2015-02-17 DIAGNOSIS — R569 Unspecified convulsions: Secondary | ICD-10-CM

## 2015-02-17 DIAGNOSIS — F209 Schizophrenia, unspecified: Secondary | ICD-10-CM | POA: Diagnosis not present

## 2015-02-17 DIAGNOSIS — F4325 Adjustment disorder with mixed disturbance of emotions and conduct: Secondary | ICD-10-CM

## 2015-02-17 MED ORDER — LEVETIRACETAM 500 MG PO TABS: 500.0000 mg | ORAL_TABLET | Freq: Two times a day (BID) | ORAL | Status: DC

## 2015-02-17 MED ORDER — DIVALPROEX SODIUM ER 500 MG PO TB24: 500.0000 mg | ORAL_TABLET | Freq: Two times a day (BID) | ORAL | Status: DC

## 2015-02-17 NOTE — Progress Notes (Signed)
Chief Complaint  Patient presents with  . Seizures    He is here with his mother, Glen Johnson, who would like to discuss his MRI.  He has a pending EEG on 03/05/15.  He has missed some doses of his anticonvulsants but has not had any seizure activity.    . Gait Problem    He is having more trouble with his unsteady gait and it led to fall last week.      PATIENT: Glen Johnson DOB: [birth year]  Chief Complaint  Patient presents with  . Seizures    He is here with his mother, Glen Johnson, who would like to discuss his MRI.  He has a pending EEG on 03/05/15.  He has missed some doses of his anticonvulsants but has not had any seizure activity.    . Gait Problem    He is having more trouble with his unsteady gait and it led to fall last week.     HISTORICAL  Glen Johnson is 39 yo RH with his mother, seen in refer by his primary care physician Dr.Edwin Concepcion Elk, MD in Dec 30 2014 for evaluation of seizure.  He had a history of traumatic brain injury in 2012, was treated at [facility], stating hospital for 2 and half months, required tracheostomy, PEG tube, prolonged rehabilitation, had slurred speech, gait difficulty ever since.  He had long-standing history of schizophrenia, often stayed away from his family, was found on the street, ever since the brain trauma, he seems to have more spells of seizure-like activity, sudden loss of consciousness, patient was not able to provide detailed history, he is amnestic of his traumatic brain injury event, he now lives with his mother now, reported his anger control problem, last seizure was in August 2016,   He is now on polypharmacy treatment, including Latuda 80 mg, seroquel 50 mg twice a day, Remeron 15 mg at bedtime, clonazepam, also Depakote ER 500 mg twice a day, Keppra 500 mg twice a day, BuSpar 5 mg daily  He is tolerating his medications, overall fairly stable, I have reviewed his most recent psychiatric visit,  carried a diagnosis of schizophrenia, violent behavior, Adjustment disorder with mixed disturbance of emotions and conduct  UPDATE Dec 5th 2016: I have personally reviewed MRI brain w/wo in Nov 2016, that was normal. He fell in Nov 2016 from ground level, no injury.  No recurrent seizure, He is now taking Depakote ER  bid, keppra  bid.  He forgets to take his medication sometimes.   He had gradual worsening gait difficulty since 2014, wide based unsteady gait. Mother report family history of disabilitating arthritis. Has no bowel and bladder incontinence  REVIEW OF SYSTEMS: Full 14 system review of systems performed and notable only for weight gain, joint pain, memory loss, confusion, seizure, snoring, disinterested in activities  ALLERGIES: No Known Allergies  HOME MEDICATIONS: Current Outpatient Prescriptions  Medication Sig Dispense Refill  . amLODipine (NORVASC) 10 MG tablet Take 1 tablet (10 mg total) by mouth daily. 30 tablet 0  . busPIRone (BUSPAR) 5 MG tablet 3 (three) times daily.  2  . clonazePAM (KLONOPIN) 0.5 MG tablet Take 1 tablet (0.5 mg total) by mouth 2 (two) times daily as needed (anxiety). 30 tablet 0  . divalproex (DEPAKOTE ER) 500 MG 24 hr tablet Take 1 tablet (500 mg total) by mouth 2 (two) times daily. 60 tablet 0  . glycopyrrolate (ROBINUL) 1 MG tablet Take 2 tablets (2 mg total) by mouth 3 (  three) times daily. 180 tablet 0  . LATUDA 80 MG TABS tablet at bedtime.  2  . levETIRAcetam (KEPPRA) 500 MG tablet Take 1 tablet (500 mg total) by mouth 2 (two) times daily. 60 tablet 0  . lurasidone 60 MG TABS Take 60 mg by mouth at bedtime. 30 tablet 0  . metoprolol tartrate (LOPRESSOR) 25 MG tablet Take 1 tablet (25 mg total) by mouth 2 (two) times daily. 60 tablet 0  . mirtazapine (REMERON) 15 MG tablet Take 1 tablet (15 mg total) by mouth at bedtime. 30 tablet 0  . omeprazole (PRILOSEC) 20 MG capsule Take 1 capsule (20 mg total) by mouth daily. 30 capsule 0  .  QUEtiapine (SEROQUEL) 50 MG tablet 2 (two) times daily.  2   No current facility-administered medications for this visit.    PAST MEDICAL HISTORY: Past Medical History  Diagnosis Date  . Seizures (HCC)   . Hypertension   . TBI (traumatic brain injury) (HCC)   . Bipolar disorder (HCC)   . Schizophrenia (HCC)   . Renal disorder     chronic kidney disease, stage II  . Depression     PAST SURGICAL HISTORY: Past Surgical History  Procedure Laterality Date  . Tracheostomy    . Tracheostomy closure      FAMILY HISTORY: Family History  Problem Relation Age of Onset  . Seizures Mother   . Migraines Mother   . Arthritis Mother     SOCIAL HISTORY:  Social History   Social History  . Marital Status: Single    Spouse Name: N/A  . Number of Children: 2  . Years of Education: 12   Occupational History  . Disabled    Social History Main Topics  . Smoking status: Former Games developermoker  . Smokeless tobacco: Not on file  . Alcohol Use: No     Comment: Stopped one year ago.  . Drug Use: No     Comment: Stopped one year ago.  Marland Kitchen. Sexual Activity: Not on file   Other Topics Concern  . Not on file   Social History Narrative   Lives at home with his mother.   No caffeine use.   Right-handed.     PHYSICAL EXAM   Filed Vitals:   02/17/15 1105  BP: 112/72  Pulse: 68  Height: 5\' 9"  (1.753 m)  Weight: 205 lb (92.987 kg)    Not recorded      Body mass index is 30.26 kg/(m^2).  PHYSICAL EXAMNIATION:  Gen: NAD, conversant, well nourised, obese, well groomed                     Cardiovascular: Regular rate rhythm, no peripheral edema, warm, nontender. Eyes: Conjunctivae clear without exudates or hemorrhage Neck: Supple, no carotid bruise. Pulmonary: Clear to auscultation bilaterally   NEUROLOGICAL EXAM:  MENTAL STATUS: Speech /Cognition:   Mild slurred speech, depend on his mother to provide history, cooperative on examination, but difficult to follow 3 step  commands   CRANIAL NERVES: CN II: Visual fields are full to confrontation. Pupils are round equal and briskly reactive to light. CN III, IV, VI: extraocular movement are normal. No ptosis. CN V: Facial sensation is intact to pinprick in all 3 divisions bilaterally. Corneal responses are intact.  CN VII: Face is symmetric with normal eye closure and smile. CN VIII: Hearing is normal to rubbing fingers CN IX, X: Palate elevates symmetrically. Phonation is normal. CN XI: Head turning and shoulder shrug are  intact CN XII: Tongue is midline with normal movements and no atrophy.  MOTOR: Mild increased bilateral upper and lower extremity tone, no significant weakness  REFLEXES: Reflexes are 2+ and symmetric at the biceps, triceps, knees, and ankles. Plantar responses are flexor.  SENSORY: Intact to light touch vibratory sensations   COORDINATION:  There is no dysmetria on finger-to-nose and heel-knee-shin.    GAIT/STANCE: Wide-based, cautious, unsteady gait  DIAGNOSTIC DATA (LABS, IMAGING, TESTING) - I reviewed patient records, labs, notes, testing and imaging myself where available.   ASSESSMENT AND PLAN  Treveon Bourcier is a 39 y.o. male   History of traumatic brain injury Schizophrenia on polypharmacy treatment, Seizure  Continue Depakote ER 500 mg twice a day, Keppra 500 mg twice a day   compliance is a issue, but mother does not want to change to extended release form  MRI of the brain with and without contrast was normal  EEG is pending   Worsening gait difficulty  This could due to residual deficit from his previous traumatic brain injury,  If he continue have worsening gait difficulty may also consider MRI of cervical spine to rule out structural lesion   Levert Feinstein, M.D. Ph.D.  [facility] [address], [address] [place], [place] [address] Ph: [phone] Fax: [phone]  CC: Referring Provider

## 2015-02-18 DIAGNOSIS — R569 Unspecified convulsions: Secondary | ICD-10-CM | POA: Insufficient documentation

## 2015-03-05 ENCOUNTER — Ambulatory Visit (INDEPENDENT_AMBULATORY_CARE_PROVIDER_SITE_OTHER): Payer: Medicaid Other | Admitting: Neurology

## 2015-03-05 DIAGNOSIS — Z87820 Personal history of traumatic brain injury: Secondary | ICD-10-CM

## 2015-03-05 DIAGNOSIS — R569 Unspecified convulsions: Secondary | ICD-10-CM | POA: Diagnosis not present

## 2015-03-05 NOTE — Procedures (Signed)
     History:  Glen Johnson is a 39 year old gentleman with a history of a traumatic brain injury in 2012. He has a history of schizophrenia as well, but he has been having episodes of sudden loss of consciousness. The patient is being evaluated for possible seizures.  This is a routine EEG. No skull defects are noted. Medications include Norvasc, BuSpar, clonazepam, Depakote, glycopyrrolate, Latuda, Keppra, metoprolol, Remeron, Prilosec, and Seroquel.   EEG classification: Normal awake  Description of the recording: The background rhythms of this recording consists of a fairly well modulated medium amplitude alpha rhythm of 10 Hz that is reactive to eye opening and closure. As the record progresses, the patient appears to remain in the waking state throughout the recording. Photic stimulation was performed, resulting in a bilateral and symmetric photic driving response. Hyperventilation was also performed, resulting in a minimal buildup of the background rhythm activities without significant slowing seen. At no time during the recording does there appear to be evidence of spike or spike wave discharges or evidence of focal slowing. EKG monitor shows no evidence of cardiac rhythm abnormalities with a heart rate of 66.  Impression: This is a normal EEG recording in the waking state. No evidence of ictal or interictal discharges are seen.

## 2015-03-06 ENCOUNTER — Telehealth: Payer: Self-pay

## 2015-03-06 NOTE — Telephone Encounter (Signed)
-----   Message from York Spanielharles K Willis, MD sent at 03/05/2015  5:09 PM EST -----  EEG done today on this patient was normal, please call the patient.

## 2015-03-06 NOTE — Telephone Encounter (Signed)
I spoke to the patient's mother and gave her the results (okay per DPR).

## 2015-06-03 ENCOUNTER — Encounter (HOSPITAL_COMMUNITY): Payer: Self-pay | Admitting: Emergency Medicine

## 2015-06-03 ENCOUNTER — Emergency Department (HOSPITAL_COMMUNITY)
Admission: EM | Admit: 2015-06-03 | Discharge: 2015-06-04 | Disposition: A | Discharge status: Home / Self Care | Payer: Medicaid Other | Attending: Emergency Medicine | Admitting: Emergency Medicine

## 2015-06-03 DIAGNOSIS — Z79899 Other long term (current) drug therapy: Secondary | ICD-10-CM | POA: Insufficient documentation

## 2015-06-03 DIAGNOSIS — Z87820 Personal history of traumatic brain injury: Secondary | ICD-10-CM | POA: Insufficient documentation

## 2015-06-03 DIAGNOSIS — F209 Schizophrenia, unspecified: Secondary | ICD-10-CM | POA: Diagnosis present

## 2015-06-03 DIAGNOSIS — I10 Essential (primary) hypertension: Secondary | ICD-10-CM | POA: Diagnosis not present

## 2015-06-03 DIAGNOSIS — F1310 Sedative, hypnotic or anxiolytic abuse, uncomplicated: Secondary | ICD-10-CM | POA: Insufficient documentation

## 2015-06-03 DIAGNOSIS — R569 Unspecified convulsions: Secondary | ICD-10-CM | POA: Diagnosis present

## 2015-06-03 DIAGNOSIS — F919 Conduct disorder, unspecified: Secondary | ICD-10-CM | POA: Diagnosis not present

## 2015-06-03 DIAGNOSIS — F419 Anxiety disorder, unspecified: Secondary | ICD-10-CM | POA: Insufficient documentation

## 2015-06-03 DIAGNOSIS — F1210 Cannabis abuse, uncomplicated: Secondary | ICD-10-CM | POA: Insufficient documentation

## 2015-06-03 DIAGNOSIS — Z9119 Patient's noncompliance with other medical treatment and regimen: Secondary | ICD-10-CM | POA: Insufficient documentation

## 2015-06-03 DIAGNOSIS — Z87891 Personal history of nicotine dependence: Secondary | ICD-10-CM | POA: Insufficient documentation

## 2015-06-03 DIAGNOSIS — F329 Major depressive disorder, single episode, unspecified: Secondary | ICD-10-CM | POA: Diagnosis not present

## 2015-06-03 DIAGNOSIS — R4689 Other symptoms and signs involving appearance and behavior: Secondary | ICD-10-CM

## 2015-06-03 DIAGNOSIS — Z9114 Patient's other noncompliance with medication regimen: Secondary | ICD-10-CM

## 2015-06-03 DIAGNOSIS — Z91148 Patient's other noncompliance with medication regimen for other reason: Secondary | ICD-10-CM

## 2015-06-03 LAB — COMPREHENSIVE METABOLIC PANEL
ALBUMIN: 4.2 g/dL (ref 3.5–5.0)
ALT: 39 U/L (ref 17–63)
ANION GAP: 11 (ref 5–15)
AST: 43 U/L — ABNORMAL HIGH (ref 15–41)
Alkaline Phosphatase: 49 U/L (ref 38–126)
BILIRUBIN TOTAL: 0.7 mg/dL (ref 0.3–1.2)
BUN: 13 mg/dL (ref 6–20)
CHLORIDE: 104 mmol/L (ref 101–111)
CO2: 20 mmol/L — ABNORMAL LOW (ref 22–32)
Calcium: 9.4 mg/dL (ref 8.9–10.3)
Creatinine, Ser: 1.79 mg/dL — ABNORMAL HIGH (ref 0.61–1.24)
GFR calc Af Amer: 53 mL/min — ABNORMAL LOW (ref >60)
GFR, EST NON AFRICAN AMERICAN: 46 mL/min — AB (ref >60)
GLUCOSE: 96 mg/dL (ref 65–99)
POTASSIUM: 3.8 mmol/L (ref 3.5–5.1)
Sodium: 135 mmol/L (ref 135–145)
TOTAL PROTEIN: 7.5 g/dL (ref 6.5–8.1)

## 2015-06-03 LAB — CBC WITH DIFFERENTIAL/PLATELET
BASOS ABS: 0 10*3/uL (ref 0.0–0.1)
BASOS PCT: 0 %
Eosinophils Absolute: 0 10*3/uL (ref 0.0–0.7)
Eosinophils Relative: 0 %
HEMATOCRIT: 42.3 % (ref 39.0–52.0)
HEMOGLOBIN: 14.8 g/dL (ref 13.0–17.0)
Lymphocytes Relative: 10 %
Lymphs Abs: 0.8 10*3/uL (ref 0.7–4.0)
MCH: 30.6 pg (ref 26.0–34.0)
MCHC: 35 g/dL (ref 30.0–36.0)
MCV: 87.6 fL (ref 78.0–100.0)
Monocytes Absolute: 0.6 10*3/uL (ref 0.1–1.0)
Monocytes Relative: 7 %
NEUTROS ABS: 7 10*3/uL (ref 1.7–7.7)
NEUTROS PCT: 83 %
Platelets: 208 10*3/uL (ref 150–400)
RBC: 4.83 MIL/uL (ref 4.22–5.81)
RDW: 12.9 % (ref 11.5–15.5)
WBC: 8.4 10*3/uL (ref 4.0–10.5)

## 2015-06-03 LAB — ETHANOL

## 2015-06-03 MED ORDER — LURASIDONE HCL 40 MG PO TABS: 60.0000 mg | ORAL_TABLET | Freq: Every day | ORAL | Status: DC

## 2015-06-03 MED ORDER — MIRTAZAPINE 15 MG PO TABS
15.0000 mg | ORAL_TABLET | Freq: Every day | ORAL | Status: DC
  Administered 2015-06-04: 15 mg via ORAL
  Filled 2015-06-03: qty 1

## 2015-06-03 MED ORDER — LEVETIRACETAM 500 MG PO TABS
500.0000 mg | ORAL_TABLET | Freq: Two times a day (BID) | ORAL | Status: DC
  Administered 2015-06-04: 500 mg via ORAL
  Filled 2015-06-03 (×2): qty 1

## 2015-06-03 MED ORDER — DIVALPROEX SODIUM ER 500 MG PO TB24
500.0000 mg | ORAL_TABLET | Freq: Two times a day (BID) | ORAL | Status: DC
  Administered 2015-06-04 (×2): 500 mg via ORAL
  Filled 2015-06-03 (×2): qty 1

## 2015-06-03 MED ORDER — LURASIDONE HCL 20 MG PO TABS
60.0000 mg | ORAL_TABLET | Freq: Every day | ORAL | Status: DC
  Filled 2015-06-03: qty 3

## 2015-06-03 MED ORDER — HALOPERIDOL LACTATE 5 MG/ML IJ SOLN: 5.0000 mg | Freq: Four times a day (QID) | INTRAMUSCULAR | Status: DC | PRN

## 2015-06-03 MED ORDER — QUETIAPINE FUMARATE 25 MG PO TABS
50.0000 mg | ORAL_TABLET | Freq: Two times a day (BID) | ORAL | Status: DC
  Administered 2015-06-04 (×2): 50 mg via ORAL
  Filled 2015-06-03 (×2): qty 2

## 2015-06-03 MED ORDER — LURASIDONE HCL 80 MG PO TABS
80.0000 mg | ORAL_TABLET | Freq: Every day | ORAL | Status: DC
  Administered 2015-06-04: 80 mg via ORAL
  Filled 2015-06-03: qty 1

## 2015-06-03 MED ORDER — LORAZEPAM 2 MG/ML IJ SOLN: 2.0000 mg | Freq: Four times a day (QID) | INTRAMUSCULAR | Status: DC | PRN

## 2015-06-03 MED ORDER — BUSPIRONE HCL 10 MG PO TABS
5.0000 mg | ORAL_TABLET | Freq: Three times a day (TID) | ORAL | Status: DC
  Administered 2015-06-04 (×2): 5 mg via ORAL
  Filled 2015-06-03 (×2): qty 1

## 2015-06-03 MED ORDER — LORAZEPAM 1 MG PO TABS: 2.0000 mg | ORAL_TABLET | Freq: Four times a day (QID) | ORAL | Status: DC | PRN

## 2015-06-03 MED ORDER — CLONAZEPAM 0.5 MG PO TABS: 0.5000 mg | ORAL_TABLET | Freq: Two times a day (BID) | ORAL | Status: DC | PRN

## 2015-06-03 NOTE — ED Notes (Signed)
Tele psy machine placed in room per request of assessment

## 2015-06-03 NOTE — ED Notes (Signed)
Officer served pt w/ IVC paperwork

## 2015-06-03 NOTE — BH Assessment (Signed)
Assessment completed. Consulted Donell SievertSpencer Simon, PA-C who recommended an AM psych eval. Informed Dr. Clydene PughKnott of the recommendation.

## 2015-06-03 NOTE — ED Provider Notes (Signed)
CSN: [id number]     Arrival date & time 06/03/15  2028 History   First MD Initiated Contact with Patient 06/03/15 2043     Chief Complaint  Patient presents with  . Seizures     (Consider location/radiation/quality/duration/timing/severity/associated sxs/prior Treatment) Patient is a 40 y.o. male presenting with seizures. The history is provided by the patient.  Seizures Seizure activity on arrival: no   Seizure type:  Grand mal Initial focality:  None Episode characteristics: abnormal movements, combativeness, confusion, generalized shaking and unresponsiveness   Postictal symptoms: confusion   Return to baseline: yes   Severity:  Moderate Duration:  30 seconds Timing:  Once Progression:  Resolved Context: medical non-compliance   Context comment:  Psychiatric history, increasing volatility with family and refusing to take medicines Recent head injury:  No recent head injuries PTA treatment:  None History of seizures: yes     Past Medical History  Diagnosis Date  . Seizures (HCC)   . Hypertension   . TBI (traumatic brain injury) (HCC)   . Bipolar disorder (HCC)   . Schizophrenia (HCC)   . Renal disorder     chronic kidney disease, stage II  . Depression    Past Surgical History  Procedure Laterality Date  . Tracheostomy    . Tracheostomy closure     Family History  Problem Relation Age of Onset  . Seizures Mother   . Migraines Mother   . Arthritis Mother    Social History  Substance Use Topics  . Smoking status: Former Games developer  . Smokeless tobacco: None  . Alcohol Use: No     Comment: Stopped one year ago.    Review of Systems  Neurological: Positive for seizures.  All other systems reviewed and are negative.     Allergies  Review of patient's allergies indicates no known allergies.  Home Medications   Prior to Admission medications   Medication Sig Start Date End Date Taking? Authorizing Provider  amLODipine (NORVASC) 10 MG tablet Take 1  tablet (10 mg total) by mouth daily. 06/28/14  Yes Earney Navy, NP  busPIRone (BUSPAR) 5 MG tablet Take 5 mg by mouth 3 (three) times daily.  12/03/14  Yes Historical Provider, MD  clonazePAM (KLONOPIN) 0.5 MG tablet Take 1 tablet (0.5 mg total) by mouth 2 (two) times daily as needed (anxiety). 06/28/14  Yes Earney Navy, NP  divalproex (DEPAKOTE ER) 500 MG 24 hr tablet Take 500 mg by mouth 2 (two) times daily.   Yes Historical Provider, MD  glycopyrrolate (ROBINUL) 2 MG tablet Take 2 mg by mouth 3 (three) times daily.   Yes Historical Provider, MD  LATUDA 80 MG TABS tablet Take 80 mg by mouth at bedtime.  11/22/14  Yes Historical Provider, MD  levETIRAcetam (KEPPRA) 500 MG tablet Take 1 tablet (500 mg total) by mouth 2 (two) times daily. 02/17/15  Yes Levert Feinstein, MD  Lurasidone HCl (LATUDA) 60 MG TABS Take 1 tablet by mouth daily.   Yes Historical Provider, MD  metoprolol tartrate (LOPRESSOR) 25 MG tablet Take 1 tablet (25 mg total) by mouth 2 (two) times daily. 06/28/14  Yes Earney Navy, NP  mirtazapine (REMERON) 15 MG tablet Take 1 tablet (15 mg total) by mouth at bedtime. 06/28/14  Yes Earney Navy, NP  QUEtiapine (SEROQUEL) 50 MG tablet 2 (two) times daily. 11/22/14  Yes Historical Provider, MD  divalproex (DEPAKOTE ER) 500 MG 24 hr tablet Take 1 tablet (500 mg total) by mouth 2 (  two) times daily. Patient not taking: Reported on 06/03/2015 02/17/15   Levert FeinsteinYijun Yan, MD  glycopyrrolate (ROBINUL) 1 MG tablet Take 2 tablets (2 mg total) by mouth 3 (three) times daily. Patient not taking: Reported on 06/03/2015 06/28/14   Earney NavyJosephine C Onuoha, NP  lurasidone 60 MG TABS Take 60 mg by mouth at bedtime. Patient not taking: Reported on 06/03/2015 06/28/14   Earney NavyJosephine C Onuoha, NP  omeprazole (PRILOSEC) 20 MG capsule Take 1 capsule (20 mg total) by mouth daily. Patient not taking: Reported on 06/03/2015 05/29/14   April Palumbo, MD   BP 124/82 mmHg  Pulse 111  Temp(Src) 98.3 F (36.8 C) (Oral)   SpO2 95% Physical Exam  Constitutional: He is oriented to person, place, and time. He appears well-developed and well-nourished. No distress.  HENT:  Head: Normocephalic and atraumatic.  Eyes: Conjunctivae are normal.  Neck: Neck supple. No tracheal deviation present.  Cardiovascular: Normal rate and regular rhythm.   Pulmonary/Chest: Effort normal. No respiratory distress.  Abdominal: Soft. He exhibits no distension.  Neurological: He is alert and oriented to person, place, and time.  Skin: Skin is warm and dry.  Psychiatric: His mood appears anxious. His speech is delayed. He is not actively hallucinating. Thought content is not paranoid and not delusional. He expresses no homicidal and no suicidal ideation. He expresses no suicidal plans and no homicidal plans. He exhibits abnormal recent memory (does not remember episode).    ED Course  Procedures (including critical care time) Labs Review Labs Reviewed  COMPREHENSIVE METABOLIC PANEL - Abnormal; Notable for the following:    CO2 20 (*)    Creatinine, Ser 1.79 (*)    AST 43 (*)    GFR calc non Af Amer 46 (*)    GFR calc Af Amer 53 (*)    All other components within normal limits  ETHANOL  CBC WITH DIFFERENTIAL/PLATELET  VALPROIC ACID LEVEL  URINE RAPID DRUG SCREEN, HOSP PERFORMED    Imaging Review No results found. I have personally reviewed and evaluated these images and lab results as part of my medical decision-making.   EKG Interpretation None      MDM   Final diagnoses:  Seizure (HCC)  Noncompliance with medications  Aggressive behavior    40 y.o. male presents with seizure episode after being noncompliant with his medications including Keppra and Depakote. He has a known history of schizophrenia and seizure disorder and per his family he has been noncompliant and increasingly combative over the last 5 days. Patient states that he has not been taking his medications because he does not want to. He agreed to  take doses of his medications here and agreed to talk to TTS to evaluate him for safety. In route he was given Haldol and Versed because he was extremely combative and postictal. He is now able to follow commands, denies suicidal or homicidal ideation, and is calm and cooperative.  Patient given his home doses of Depakote and Keppra here to prevent recurrent seizures. Ativan and Haldol added as needed for agitation or anxiety. TTS recommending reassessment in the morning for further stabilization. The family is working toward involuntary commitment through [place] magistrate office but the patient has not shown any violent or threatening behavior since being in the emergency department. He may require involuntary commitment if this occurs.   MEDICALLY CLEAR FOR TRANSFER OR PSYCHIATRIC ADMISSION.   Lyndal Pulleyaniel Carnesha Maravilla, MD 06/03/15 [phone]

## 2015-06-03 NOTE — ED Notes (Signed)
[facility] reported that pt began having a seizure, daughter pulled over at PD station, EMS was called.  Has hx of seizures, bipolar and schizophrenia is and is non-compliant w/ medication per family (last medication being last Thursday.)  He was postictal and combative, upon arrival he was in hand cuffs and strapped to the gurney.  He was given a total of 10mg  of haldol and 5mg  of versed IM in route.  Since arrival he has followed direction.
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
## 2015-06-04 DIAGNOSIS — F209 Schizophrenia, unspecified: Secondary | ICD-10-CM

## 2015-06-04 DIAGNOSIS — Z9119 Patient's noncompliance with other medical treatment and regimen: Secondary | ICD-10-CM

## 2015-06-04 LAB — RAPID URINE DRUG SCREEN, HOSP PERFORMED
Amphetamines: NOT DETECTED
BENZODIAZEPINES: POSITIVE — AB
Barbiturates: NOT DETECTED
COCAINE: NOT DETECTED
OPIATES: NOT DETECTED
Tetrahydrocannabinol: POSITIVE — AB

## 2015-06-04 LAB — VALPROIC ACID LEVEL

## 2015-06-04 NOTE — Discharge Instructions (Signed)

## 2015-06-04 NOTE — Progress Notes (Signed)
CSW spoke with pt's mother re: pt's return home.  Pt's mother will allow pt to return home at this time and doesn't want him to go to shelter.  Taxi voucher provided for transportation.

## 2015-06-04 NOTE — ED Provider Notes (Addendum)
Pt remains medically stable.  He has not complaints and wants to go home.  Pt is getting dressed.  Pt was seen by psychiatry today.  No indication for psychiatric placement.  Pt stable for discharge.  May need group home.  Will consult with social work for assistance.  Linwood DibblesJon Hyatt Capobianco, MD 06/04/15 1512  Social work has spoken to the family.  Pt can be released to go to a shelter or return home with his mother if she is wants to take him back home.  Linwood DibblesJon Yosgar Demirjian, MD 06/04/15 [phone]
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
## 2015-06-04 NOTE — ED Notes (Signed)
NAD at this time. Pt is going to a shelter.

## 2015-06-04 NOTE — ED Notes (Signed)
Covering sitter who went tot lunch.

## 2015-07-14 ENCOUNTER — Emergency Department (HOSPITAL_COMMUNITY)
Admission: EM | Admit: 2015-07-14 | Discharge: 2015-07-17 | Disposition: A | Discharge status: Home / Self Care | Payer: Medicaid Other | Attending: Emergency Medicine | Admitting: Emergency Medicine

## 2015-07-14 ENCOUNTER — Encounter (HOSPITAL_COMMUNITY): Payer: Self-pay

## 2015-07-14 DIAGNOSIS — N182 Chronic kidney disease, stage 2 (mild): Secondary | ICD-10-CM | POA: Insufficient documentation

## 2015-07-14 DIAGNOSIS — R456 Violent behavior: Secondary | ICD-10-CM | POA: Diagnosis not present

## 2015-07-14 DIAGNOSIS — Z87891 Personal history of nicotine dependence: Secondary | ICD-10-CM | POA: Diagnosis not present

## 2015-07-14 DIAGNOSIS — Z046 Encounter for general psychiatric examination, requested by authority: Secondary | ICD-10-CM | POA: Diagnosis not present

## 2015-07-14 DIAGNOSIS — F319 Bipolar disorder, unspecified: Secondary | ICD-10-CM | POA: Diagnosis not present

## 2015-07-14 DIAGNOSIS — Z72811 Adult antisocial behavior: Secondary | ICD-10-CM | POA: Diagnosis present

## 2015-07-14 DIAGNOSIS — Z79899 Other long term (current) drug therapy: Secondary | ICD-10-CM | POA: Diagnosis not present

## 2015-07-14 DIAGNOSIS — G40909 Epilepsy, unspecified, not intractable, without status epilepticus: Secondary | ICD-10-CM | POA: Insufficient documentation

## 2015-07-14 DIAGNOSIS — I129 Hypertensive chronic kidney disease with stage 1 through stage 4 chronic kidney disease, or unspecified chronic kidney disease: Secondary | ICD-10-CM | POA: Diagnosis not present

## 2015-07-14 DIAGNOSIS — F209 Schizophrenia, unspecified: Secondary | ICD-10-CM | POA: Diagnosis not present

## 2015-07-14 LAB — CBC WITH DIFFERENTIAL/PLATELET
BASOS PCT: 0 %
Basophils Absolute: 0 10*3/uL (ref 0.0–0.1)
EOS PCT: 1 %
Eosinophils Absolute: 0.1 10*3/uL (ref 0.0–0.7)
HCT: 41.2 % (ref 39.0–52.0)
Hemoglobin: 14.4 g/dL (ref 13.0–17.0)
Lymphocytes Relative: 22 %
Lymphs Abs: 1.1 10*3/uL (ref 0.7–4.0)
MCH: 30.8 pg (ref 26.0–34.0)
MCHC: 35 g/dL (ref 30.0–36.0)
MCV: 88.2 fL (ref 78.0–100.0)
MONO ABS: 0.4 10*3/uL (ref 0.1–1.0)
MONOS PCT: 9 %
Neutro Abs: 3.5 10*3/uL (ref 1.7–7.7)
Neutrophils Relative %: 68 %
Platelets: 197 10*3/uL (ref 150–400)
RBC: 4.67 MIL/uL (ref 4.22–5.81)
RDW: 13.1 % (ref 11.5–15.5)
WBC: 5.1 10*3/uL (ref 4.0–10.5)

## 2015-07-14 LAB — RAPID URINE DRUG SCREEN, HOSP PERFORMED
AMPHETAMINES: NOT DETECTED
Barbiturates: NOT DETECTED
Benzodiazepines: NOT DETECTED
COCAINE: NOT DETECTED
OPIATES: NOT DETECTED
TETRAHYDROCANNABINOL: NOT DETECTED

## 2015-07-14 LAB — COMPREHENSIVE METABOLIC PANEL
ALBUMIN: 4.3 g/dL (ref 3.5–5.0)
ALT: 27 U/L (ref 17–63)
ANION GAP: 10 (ref 5–15)
AST: 24 U/L (ref 15–41)
Alkaline Phosphatase: 42 U/L (ref 38–126)
BUN: 13 mg/dL (ref 6–20)
CO2: 23 mmol/L (ref 22–32)
CREATININE: 1.64 mg/dL — AB (ref 0.61–1.24)
Calcium: 9.4 mg/dL (ref 8.9–10.3)
Chloride: 110 mmol/L (ref 101–111)
GFR calc non Af Amer: 51 mL/min — ABNORMAL LOW (ref >60)
GFR, EST AFRICAN AMERICAN: 59 mL/min — AB (ref >60)
Glucose, Bld: 121 mg/dL — ABNORMAL HIGH (ref 65–99)
POTASSIUM: 4.1 mmol/L (ref 3.5–5.1)
SODIUM: 143 mmol/L (ref 135–145)
Total Bilirubin: 0.6 mg/dL (ref 0.3–1.2)
Total Protein: 7.7 g/dL (ref 6.5–8.1)

## 2015-07-14 LAB — ETHANOL: Alcohol, Ethyl (B): <5 mg/dL (ref <5)

## 2015-07-14 LAB — VALPROIC ACID LEVEL: Valproic Acid Lvl: 62 ug/mL (ref 50.0–100.0)

## 2015-07-14 LAB — SALICYLATE LEVEL: Salicylate Lvl: <4 mg/dL (ref 2.8–30.0)

## 2015-07-14 LAB — ACETAMINOPHEN LEVEL: Acetaminophen (Tylenol), Serum: <10 ug/mL — ABNORMAL LOW (ref 10–30)

## 2015-07-14 MED ORDER — BUSPIRONE HCL 10 MG PO TABS
5.0000 mg | ORAL_TABLET | Freq: Three times a day (TID) | ORAL | Status: DC
  Administered 2015-07-14 – 2015-07-17 (×11): 5 mg via ORAL
  Filled 2015-07-14 (×11): qty 1

## 2015-07-14 MED ORDER — ALUM & MAG HYDROXIDE-SIMETH 200-200-20 MG/5ML PO SUSP: 30.0000 mL | ORAL | Status: DC | PRN

## 2015-07-14 MED ORDER — LORAZEPAM 1 MG PO TABS
1.0000 mg | ORAL_TABLET | Freq: Three times a day (TID) | ORAL | Status: DC | PRN
  Filled 2015-07-14: qty 1

## 2015-07-14 MED ORDER — CLONAZEPAM 0.5 MG PO TABS
0.5000 mg | ORAL_TABLET | Freq: Two times a day (BID) | ORAL | Status: DC | PRN
  Administered 2015-07-14: 0.5 mg via ORAL
  Filled 2015-07-14: qty 1

## 2015-07-14 MED ORDER — IBUPROFEN 200 MG PO TABS
600.0000 mg | ORAL_TABLET | Freq: Three times a day (TID) | ORAL | Status: DC | PRN
  Administered 2015-07-15: 600 mg via ORAL
  Filled 2015-07-14 (×2): qty 3

## 2015-07-14 MED ORDER — DIVALPROEX SODIUM ER 500 MG PO TB24
500.0000 mg | ORAL_TABLET | Freq: Two times a day (BID) | ORAL | Status: DC
  Administered 2015-07-14 – 2015-07-17 (×7): 500 mg via ORAL
  Filled 2015-07-14 (×7): qty 1

## 2015-07-14 MED ORDER — LURASIDONE HCL 40 MG PO TABS
60.0000 mg | ORAL_TABLET | Freq: Every day | ORAL | Status: DC
  Administered 2015-07-14 – 2015-07-16 (×3): 60 mg via ORAL
  Filled 2015-07-14 (×4): qty 1

## 2015-07-14 MED ORDER — ZOLPIDEM TARTRATE 5 MG PO TABS
5.0000 mg | ORAL_TABLET | Freq: Every evening | ORAL | Status: DC | PRN
  Administered 2015-07-15: 5 mg via ORAL
  Filled 2015-07-14: qty 1

## 2015-07-14 MED ORDER — NICOTINE 21 MG/24HR TD PT24
21.0000 mg | MEDICATED_PATCH | Freq: Every day | TRANSDERMAL | Status: DC
  Administered 2015-07-15: 21 mg via TRANSDERMAL
  Filled 2015-07-14 (×2): qty 1

## 2015-07-14 MED ORDER — QUETIAPINE FUMARATE 50 MG PO TABS
50.0000 mg | ORAL_TABLET | Freq: Two times a day (BID) | ORAL | Status: DC
  Administered 2015-07-14 – 2015-07-17 (×7): 50 mg via ORAL
  Filled 2015-07-14 (×7): qty 1

## 2015-07-14 MED ORDER — ACETAMINOPHEN 325 MG PO TABS: 650.0000 mg | ORAL_TABLET | ORAL | Status: DC | PRN

## 2015-07-14 MED ORDER — GLYCOPYRROLATE 1 MG PO TABS
2.0000 mg | ORAL_TABLET | Freq: Three times a day (TID) | ORAL | Status: DC
  Administered 2015-07-14 – 2015-07-17 (×10): 2 mg via ORAL
  Filled 2015-07-14 (×11): qty 2

## 2015-07-14 MED ORDER — AMLODIPINE BESYLATE 10 MG PO TABS
10.0000 mg | ORAL_TABLET | Freq: Every day | ORAL | Status: DC
  Administered 2015-07-15 – 2015-07-17 (×3): 10 mg via ORAL
  Filled 2015-07-14 (×3): qty 1

## 2015-07-14 MED ORDER — LEVETIRACETAM 500 MG PO TABS
500.0000 mg | ORAL_TABLET | Freq: Two times a day (BID) | ORAL | Status: DC
  Administered 2015-07-14 – 2015-07-17 (×6): 500 mg via ORAL
  Filled 2015-07-14 (×7): qty 1

## 2015-07-14 MED ORDER — ONDANSETRON HCL 4 MG PO TABS: 4.0000 mg | ORAL_TABLET | Freq: Three times a day (TID) | ORAL | Status: DC | PRN

## 2015-07-14 MED ORDER — MIRTAZAPINE 30 MG PO TABS
15.0000 mg | ORAL_TABLET | Freq: Every day | ORAL | Status: DC
  Administered 2015-07-14 – 2015-07-16 (×3): 15 mg via ORAL
  Filled 2015-07-14 (×4): qty 1

## 2015-07-14 MED ORDER — METOPROLOL TARTRATE 25 MG PO TABS
25.0000 mg | ORAL_TABLET | Freq: Two times a day (BID) | ORAL | Status: DC
  Administered 2015-07-14 – 2015-07-17 (×6): 25 mg via ORAL
  Filled 2015-07-14 (×7): qty 1

## 2015-07-14 NOTE — Progress Notes (Signed)
CSW was notified by nurse that mom is inquiring about pt's disposition.   CSW informed mom that the recommendation at this time is to seek inpatient treatment.  Trish MageBrittney Adean Milosevic, LCSWA [phone] ED CSW 07/14/2015 5:50 PM

## 2015-07-14 NOTE — ED Notes (Signed)
Patient was brought in by [employer]. [facility]. Patient has been leaving the facility and walking down the road. He is also been combative with staff. [employer] reported that the patient has not been taking his medications since he arrived at the facility. Patient has IVC papers and states he is a danger to himself and others.

## 2015-07-14 NOTE — ED Notes (Addendum)
Pt brought in by [employer] after being IVC'd by facility.  Pt denies SI/HI/AV.  Pt reports that he takes all medications as prescribed.  When asked questions, Pt continually makes jokes and gives random answers.  Hx of bipolar disorder, TBI, depression, and schizophrenia.     [employer] reports that the facility is not suitable for the Pt.  Reports "they don't like it when he leaves and walks to the store, due to his medical history.  He needs a facility that he cannot just come and go."  Pt has been at current facility since 4/24.

## 2015-07-14 NOTE — ED Provider Notes (Signed)
CSN: [id number]     Arrival date & time 07/14/15  4098 History   First MD Initiated Contact with Patient 07/14/15 [phone]     Chief Complaint  Patient presents with  . IVC      (Consider location/radiation/quality/duration/timing/severity/associated sxs/prior Treatment) The history is provided by the patient and medical records.    40 year old male with history of seizures, hypertension, chronic brain injury, bipolar disorder, schizophrenia, depression, chronic kidney disease, presenting to the ED under IVC. Patient was brought to the ED and [employer] custody.  [facility]. He has been in this facility for approximately 2 weeks as he is unsafe to be on his on and his mother is no longer able to care for and independently. In this facility, residents are generally allowed to sign themselves out, however barrier currently in place so patient is not allowed to do this.  Per IVC paperwork, patient has not been taking his medications as he refuses when offered. He often leaves the facility and walks to the store independently which is not safe for him to do given his medical history.  They report he has also become combative with staff and other residents. He is currently residing in a room with a blind gentleman and they are concerned what may happen if his behavior continues to escalate. Patient states when he leaves the facility he is "just going to the store for snacks". He currently denies any suicidal or homicidal ideation. He denies any alcohol or illicit drug use. No auditory or visual hallucinations. Patient repeating "i don't wanna be admitted" during exam.  Past Medical History  Diagnosis Date  . Seizures ([facility])   . Hypertension   . TBI (traumatic brain injury) ([facility])   . Bipolar disorder ([facility])   . Schizophrenia ([facility])   . Renal disorder     chronic kidney disease, stage II  . Depression    Past Surgical History  Procedure Laterality Date  .  Tracheostomy    . Tracheostomy closure     Family History  Problem Relation Age of Onset  . Seizures Mother   . Migraines Mother   . Arthritis Mother    Social History  Substance Use Topics  . Smoking status: Former Games developer  . Smokeless tobacco: None  . Alcohol Use: No     Comment: Stopped one year ago.    Review of Systems  Psychiatric/Behavioral: Positive for behavioral problems. Negative for suicidal ideas and self-injury.       IVC  All other systems reviewed and are negative.     Allergies  Review of patient's allergies indicates no known allergies.  Home Medications   Prior to Admission medications   Medication Sig Start Date End Date Taking? Authorizing Provider  amLODipine (NORVASC) 10 MG tablet Take 1 tablet (10 mg total) by mouth daily. 06/28/14  Yes Earney Navy, NP  busPIRone (BUSPAR) 5 MG tablet Take 5 mg by mouth 3 (three) times daily.  12/03/14  Yes Historical Provider, MD  clonazePAM (KLONOPIN) 0.5 MG tablet Take 1 tablet (0.5 mg total) by mouth 2 (two) times daily as needed (anxiety). Patient taking differently: Take 0.5 mg by mouth daily as needed for anxiety.  06/28/14  Yes Earney Navy, NP  divalproex (DEPAKOTE ER) 500 MG 24 hr tablet Take 1 tablet (500 mg total) by mouth 2 (two) times daily. 02/17/15  Yes Levert Feinstein, MD  glycopyrrolate (ROBINUL) 2 MG tablet Take 2 mg by  mouth 3 (three) times daily.   Yes Historical Provider, MD  levETIRAcetam (KEPPRA) 500 MG tablet Take 1 tablet (500 mg total) by mouth 2 (two) times daily. 02/17/15  Yes Levert FeinsteinYijun Yan, MD  lurasidone 60 MG TABS Take 60 mg by mouth at bedtime. Patient taking differently: Take 60 mg by mouth daily.  06/28/14  Yes Earney NavyJosephine C Onuoha, NP  metoprolol tartrate (LOPRESSOR) 25 MG tablet Take 1 tablet (25 mg total) by mouth 2 (two) times daily. 06/28/14  Yes Earney NavyJosephine C Onuoha, NP  mirtazapine (REMERON) 15 MG tablet Take 1 tablet (15 mg total) by mouth at bedtime. 06/28/14  Yes Earney NavyJosephine C Onuoha,  NP  QUEtiapine (SEROQUEL) 50 MG tablet 2 (two) times daily. 11/22/14  Yes Historical Provider, MD   BP 112/69 mmHg  Pulse 72  Temp(Src) 98.1 F (36.7 C) (Oral)  Resp 16  SpO2 99%   Physical Exam  Constitutional: He is oriented to person, place, and time. He appears well-developed and well-nourished. No distress.  HENT:  Head: Normocephalic and atraumatic.  Mouth/Throat: Oropharynx is clear and moist.  Eyes: Conjunctivae and EOM are normal. Pupils are equal, round, and reactive to light.  Neck: Normal range of motion. Neck supple.  Cardiovascular: Normal rate, regular rhythm and normal heart sounds.   Pulmonary/Chest: Effort normal and breath sounds normal. No respiratory distress. He has no wheezes.  Abdominal: Soft. Bowel sounds are normal. There is no tenderness. There is no guarding.  Musculoskeletal: Normal range of motion.  Neurological: He is alert and oriented to person, place, and time.  Skin: Skin is warm and dry. He is not diaphoretic.  Psychiatric: He has a normal mood and affect. He is not actively hallucinating. He expresses no homicidal and no suicidal ideation. He expresses no suicidal plans and no homicidal plans.  Denies SI/HI/AVH on exam He is attentive.  Nursing note and vitals reviewed.   ED Course  Procedures (including critical care time) Labs Review Labs Reviewed  COMPREHENSIVE METABOLIC PANEL - Abnormal; Notable for the following:    Glucose, Bld 121 (*)    Creatinine, Ser 1.64 (*)    GFR calc non Af Amer 51 (*)    GFR calc Af Amer 59 (*)    All other components within normal limits  ACETAMINOPHEN LEVEL - Abnormal; Notable for the following:    Acetaminophen (Tylenol), Serum <10 (*)    All other components within normal limits  CBC WITH DIFFERENTIAL/PLATELET  ETHANOL  URINE RAPID DRUG SCREEN, HOSP PERFORMED  SALICYLATE LEVEL  VALPROIC ACID LEVEL    Imaging Review No results found. I have personally reviewed and evaluated these images and lab  results as part of my medical decision-making.   EKG Interpretation None      MDM   Final diagnoses:  Schizophrenia, unspecified type ([facility])  Violent behavior  Involuntary commitment   40 y.o. M here under IVC.  [facility], however facility feels they are not capable to care for him at this time so have sent here for new placement and psychiatric evaluation.  Patient is calm and cooperative during exam. He denies any suicidal or homicidal ideation. No hallucinations.  Lab work obtain and is reassuring.  SrCr elevated however this appears baseline for patient when compared with previous.  Patient medically cleared and awaiting TTS evaluation.  Per [employer] that arrived with patient, if IP psychiatric admission not warranted, he will still require new placement as his prior facility is not willing  to take him back due to his behavior.  TTS has evaluated patient, recommends inpatient treatment. Will seek placement.  Temp holding orders and home meds ordered.  Garlon Hatchet, PA-C 07/14/15 1318  Cathren Laine, MD 07/15/15 [phone]

## 2015-07-14 NOTE — BH Assessment (Addendum)
Assessment Note  Glen Johnson is an 40 y.o. male that presents this date to [facility] under IVC that was taken out by [facility] (Group Home) . Patient has been residing there since 07/09/15 and group home reports (per notes) that patient has been non-compliant with medications and reports increased aggressive behaviors. Per IVC patient has become aggressive towards staff, curses staff and walks away from group home without permission. This Clinical research associate attempted to contact guardian (mother Laurier Jasperson) [phone]) to obtain collateral information but was unable to reach her. This Clinical research associate also left a message with Group home to inform of patient's disposition/status. Patient has a TBI and history of seizure D/O and was a poor historian with most of the information being gathered from notes for the purpose of completing assessment. Pt did deny any SI, HI and AVH at this time. PT did not report any previous suicide attempts or self-injurious behaviors. Pt is a poor historian and is providing minimal information. Pt denied any drug and alcohol use. Pt did not report any upcoming court dates or pending criminal charges. Patient is currently receiving medication management through [facility]. Patient has had previous admission with last one being 06/03/14 at [facility] for increased aggression and medication non-compliance. Case with staffed with Cresenciano Genre who stated patient meets inpatient criteria under IVC as appropriate bed placement is investigated.    Diagnosis: TBI, Seizure D/O  Past Medical History:  Past Medical History  Diagnosis Date  . Seizures (HCC)   . Hypertension   . TBI (traumatic brain injury) (HCC)   . Bipolar disorder (HCC)   . Schizophrenia (HCC)   . Renal disorder     chronic kidney disease, stage II  . Depression     Past Surgical History  Procedure Laterality Date  . Tracheostomy    . Tracheostomy closure      Family History:  Family History  Problem  Relation Age of Onset  . Seizures Mother   . Migraines Mother   . Arthritis Mother     Social History:  reports that he has quit smoking. He does not have any smokeless tobacco history on file. He reports that he does not drink alcohol or use illicit drugs.  Additional Social History:  Alcohol / Drug Use Pain Medications: See MAR Prescriptions: See MAR Over the Counter: See MAR History of alcohol / drug use?: No history of alcohol / drug abuse  CIWA: CIWA-Ar BP: 112/69 mmHg Pulse Rate: 72 COWS:    Allergies: No Known Allergies  Home Medications:  (Not in a hospital admission)  OB/GYN Status:  No LMP for male patient.  General Assessment Data [facility] of [facility]: [facility] T[facility] Assessment: In system Is this a Tele or Face-to-Face Assessment?: Face-to-Face Is this an Initial Assessment or a Re-assessment for this encounter?: Initial Assessment Marital status: Single Maiden name: na Is patient pregnant?: No Pregnancy Status: No Living Arrangements: Group Home Can pt return to current living arrangement?: No (depending if patient returns to baseline ) Admission Status: Involuntary Is patient capable of signing voluntary admission?: Yes Referral Source: Other (Group home) Insurance type: Medicaid  Medical Screening Exam [facility] Walk-in ONLY) Medical Exam completed: Yes  Crisis Care Plan Living Arrangements: Group Home Legal Guardian: Mother Name of Psychiatrist: Vesta Mixer  Name of Therapist: No provider reported.   Education Status Is patient currently in school?: No Current Grade: na Highest grade of school patient has completed: na Name of school: na Contact person: na  Risk  to self with the past 6 months Suicidal Ideation: No Has patient been a risk to self within the past 6 months prior to admission? : No Suicidal Intent: No Has patient had any suicidal intent within the past 6 months prior to admission? : No Is patient at risk for suicide?: No Suicidal Plan?:  No Has patient had any suicidal plan within the past 6 months prior to admission? : No Access to Means: No What has been your use of drugs/alcohol within the last 12 months?: Denies Previous Attempts/Gestures: No How many times?: 0 Other Self Harm Risks: Denies Triggers for Past Attempts: None known Intentional Self Injurious Behavior: None Family Suicide History: No Recent stressful life event(s): Other (Comment) (recent relocation) Persecutory voices/beliefs?: No Depression:  (UTA) Depression Symptoms:  (UTA) Substance abuse history and/or treatment for substance abuse?: No Suicide prevention information given to non-admitted patients: Not applicable  Risk to Others within the past 6 months Homicidal Ideation: No Does patient have any lifetime risk of violence toward others beyond the six months prior to admission? : No Thoughts of Harm to Others: No Current Homicidal Intent: No Current Homicidal Plan: No Access to Homicidal Means: No Identified Victim: na History of harm to others?: No Assessment of Violence: None Noted Violent Behavior Description: Denies Does patient have access to weapons?: No Criminal Charges Pending?: No Does patient have a court date: No Is patient on probation?: No  Psychosis Hallucinations: None noted Delusions: None noted  Mental Status Report Appearance/Hygiene: Unremarkable Eye Contact: Poor Motor Activity: Agitation Speech: Loud Level of Consciousness: Quiet/awake Mood: Pleasant Affect: Blunted Anxiety Level: Minimal Thought Processes: Circumstantial Judgement: Partial Orientation: Unable to assess Obsessive Compulsive Thoughts/Behaviors: None  Cognitive Functioning Concentration: Decreased Memory: Recent Intact IQ: Average Insight: Fair Impulse Control: Fair Appetite: Good Weight Loss: 0 Weight Gain: 0 Sleep: No Change Total Hours of Sleep: 8 Vegetative Symptoms: None  ADLScreening [facility]) Patient's  cognitive ability adequate to safely complete daily activities?: Yes Patient able to express need for assistance with ADLs?: Yes Independently performs ADLs?: Yes (appropriate for developmental age)  Prior Inpatient Therapy Prior Inpatient Therapy: No Prior Therapy Dates: NA Prior Therapy Facilty/Provider(s): NA Reason for Treatment: NA  Prior Outpatient Therapy Prior Outpatient Therapy: Yes Prior Therapy Dates: 2016 Prior Therapy Facilty/Provider(s): [facility]  Reason for Treatment: Medication management  Does patient have an ACCT team?: No Does patient have Intensive In-House Services?  : No Does patient have [facility] services? : Yes Does patient have P4CC services?: No  ADL Screening (condition at time of admission) Patient's cognitive ability adequate to safely complete daily activities?: Yes Is the patient deaf or have difficulty hearing?: No Does the patient have difficulty seeing, even when wearing glasses/contacts?: No Does the patient have difficulty concentrating, remembering, or making decisions?: Yes Patient able to express need for assistance with ADLs?: Yes Does the patient have difficulty dressing or bathing?: No Independently performs ADLs?: Yes (appropriate for developmental age) Does the patient have difficulty walking or climbing stairs?: No Weakness of Legs: None Weakness of Arms/Hands: None  Home Assistive Devices/Equipment Home Assistive Devices/Equipment: None  Therapy Consults (therapy consults require a physician order) PT Evaluation Needed: No OT Evalulation Needed: No SLP Evaluation Needed: No Abuse/Neglect Assessment (Assessment to be complete while patient is alone) Physical Abuse: Denies Verbal Abuse: Denies Sexual Abuse: Denies Exploitation of patient/patient's resources: Denies Self-Neglect: Denies Values / Beliefs Cultural Requests During Hospitalization: None Spiritual Requests During Hospitalization: None Consults Spiritual Care Consult  Needed: No Social  Work Consult Needed: No Merchant navy officerAdvance Directives (For Healthcare) Does patient have an advance directive?: No Would patient like information on creating an advanced directive?: No - patient declined information (patient's mother who is guardian states she will address at a later date)    Additional Information 1:1 In Past 12 Months?: No CIRT Risk: No Elopement Risk: No Does patient have medical clearance?: Yes     Disposition: Case with staffed with Cresenciano GenreLord DNP who stated patient meets inpatient criteria under IVC as appropriate bed placement is investigated Disposition Initial Assessment Completed for this Encounter: Yes Disposition of Patient: Inpatient treatment program Type of inpatient treatment program: Adult Other disposition(s):  (patient on IVC)  On Site Evaluation by:   Reviewed with Physician:    Alfredia Fergusonavid L Colton Engdahl 07/14/2015 12:16 PM

## 2015-07-15 DIAGNOSIS — F209 Schizophrenia, unspecified: Secondary | ICD-10-CM

## 2015-07-15 DIAGNOSIS — R456 Violent behavior: Secondary | ICD-10-CM

## 2015-07-15 DIAGNOSIS — Z046 Encounter for general psychiatric examination, requested by authority: Secondary | ICD-10-CM | POA: Diagnosis not present

## 2015-07-15 MED ORDER — DIPHENHYDRAMINE HCL 25 MG PO CAPS
25.0000 mg | ORAL_CAPSULE | ORAL | Status: DC | PRN
  Administered 2015-07-15: 25 mg via ORAL
  Filled 2015-07-15: qty 1

## 2015-07-15 MED ORDER — PREDNISONE 20 MG PO TABS
60.0000 mg | ORAL_TABLET | Freq: Once | ORAL | Status: AC
  Administered 2015-07-15: 60 mg via ORAL
  Filled 2015-07-15: qty 3

## 2015-07-15 MED ORDER — METHYLPREDNISOLONE SODIUM SUCC 125 MG IJ SOLR
125.0000 mg | Freq: Once | INTRAMUSCULAR | Status: DC
  Filled 2015-07-15: qty 2

## 2015-07-15 MED ORDER — DIPHENHYDRAMINE HCL 25 MG PO CAPS
25.0000 mg | ORAL_CAPSULE | Freq: Once | ORAL | Status: AC
  Administered 2015-07-15: 25 mg via ORAL
  Filled 2015-07-15: qty 1

## 2015-07-15 NOTE — ED Notes (Signed)
Pt awake, alert & responsive, no distress noted, calm & cooperative.  Watching TV at present.  Monitoring for safety, Q 15 min checks in effect. 

## 2015-07-15 NOTE — ED Provider Notes (Signed)
Pt seen for diffuse pruritic rash x 1 week--stated on prednisone --doesn't appear to be scabies--[facility]  Lorre NickAnthony Amariana Mirando, MD 07/15/15 1116

## 2015-07-15 NOTE — ED Notes (Signed)
Pt c/o itching to hands; upon inspection, right hand appeared mildly swollen; PA Trey PaulaJeff notified and presently assessing pt

## 2015-07-15 NOTE — ED Provider Notes (Signed)
40 year old male in the TCU that is under IVC. Nursing staff asked that I evaluate the patient as he is itching his arms with a rash. They note the rash started several hours ago with papules to the upper extremities had chest and upper back. Patient given Benadryl. Upon reassessment patient has improvement in symptoms of the upper extremities,  but continues to have itching to the back of the neck and ears. Patient given Solu-Medrol. On coming provider made aware of patient, patient will need reassessment based on medication administration.     Eyvonne MechanicJeffrey Jawan Chavarria, PA-C 07/15/15 [id number]  Geoffery Lyonsouglas Delo, MD 07/16/15 [id number]
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
## 2015-07-15 NOTE — Consult Note (Signed)
[place] [facility] Consult   Reason for Consult:  Medication non compliance, Agitation Referring Physician:  EDP Patient Identification: Glen Johnson MRN:  [id number] Principal Diagnosis: Schizophrenia, unspecified type [facility]) Diagnosis:   Patient Active Problem List   Diagnosis Date Noted  . Schizophrenia, unspecified type ([place]) [F20.9]     Priority: High  . Seizures ([place]) [R56.9] 02/18/2015  . Abnormality of gait [R26.9] 02/17/2015  . Involuntary commitment [Z04.6]   . Violent behavior [R45.6]   . Adjustment disorder with mixed disturbance of emotions and conduct [F43.25] 06/18/2014    Total Time spent with patient: 45 minutes  Subjective:   Glen Johnson is a 40 y.o. male patient admitted with Medication non compliance, Agitation  HPI:  AA male, 40 years old was evaluated this morning brought in by [employer] from a group home where he was staying for only 5 days.  Patient was IVC for behavoir issues and wanting to leave without appropriate assessment.  Patient has a hx of Schizophrenia, Bipolar and  TBI, he is a poor historian and was able to states the reason for this visit.  Patient states the group home did not want him to move back to his mother.  He states that he want to stay with his mother.  Document states he has not been taking his medications and is a danger to himself by leaving the facility and walking in an out the home.  Patient has been accepted for admission and we will be seeking placement at any facility with available bed.  Please see collateral information gathered from his mother who is also his guidian  Past Psychiatric History: Schizophrenia  Risk to Self: Suicidal Ideation: No Suicidal Intent: No Is patient at risk for suicide?: No Suicidal Plan?: No Access to Means: No What has been your use of drugs/alcohol within the last 12 months?: Denies How many times?: 0 Other Self Harm Risks: Denies Triggers for Past Attempts: None  known Intentional Self Injurious Behavior: None Risk to Others: Homicidal Ideation: No Thoughts of Harm to Others: No Current Homicidal Intent: No Current Homicidal Plan: No Access to Homicidal Means: No Identified Victim: na History of harm to others?: No Assessment of Violence: None Noted Violent Behavior Description: Denies Does patient have access to weapons?: No Criminal Charges Pending?: No Does patient have a court date: No Prior Inpatient Therapy: Prior Inpatient Therapy: No Prior Therapy Dates: NA Prior Therapy Facilty/Provider(s): NA Reason for Treatment: NA Prior Outpatient Therapy: Prior Outpatient Therapy: Yes Prior Therapy Dates: 2016 Prior Therapy Facilty/Provider(s): [facility]  Reason for Treatment: Medication management  Does patient have an ACCT team?: No Does patient have Intensive In-House Services?  : No Does patient have [facility] services? : Yes Does patient have P4CC services?: No  Past Medical History:  Past Medical History  Diagnosis Date  . Seizures ([place])   . Hypertension   . TBI (traumatic brain injury) ([place])   . Bipolar disorder ([place])   . Schizophrenia ([place])   . Renal disorder     chronic kidney disease, stage II  . Depression     Past Surgical History  Procedure Laterality Date  . Tracheostomy    . Tracheostomy closure     Family History:  Family History  Problem Relation Age of Onset  . Seizures Mother   . Migraines Mother   . Arthritis Mother    Family Psychiatric  History:   Unable to obtain Social History:  History  Alcohol Use No  Comment: Stopped one year ago.     History  Drug Use No    Comment: Stopped one year ago.    Social History   Social History  . Marital Status: Single    Spouse Name: N/A  . Number of Children: 2  . Years of Education: 12   Occupational History  . Disabled    Social History Main Topics  . Smoking status: Former Research scientist (life sciences)  . Smokeless tobacco: None  . Alcohol Use: No     Comment:  Stopped one year ago.  . Drug Use: No     Comment: Stopped one year ago.  Marland Kitchen Sexual Activity: Not Asked   Other Topics Concern  . None   Social History Narrative   Lives at home with his mother.   No caffeine use.   Right-handed.   Additional Social History:    Allergies:  No Known Allergies  Labs:  Results for orders placed or performed during the hospital encounter of 07/14/15 (from the past 48 hour(s))  CBC with Differential     Status: None   Collection Time: 07/14/15 10:31 AM  Result Value Ref Range   WBC 5.1 4.0 - 10.5 K/uL   RBC 4.67 4.22 - 5.81 MIL/uL   Hemoglobin 14.4 13.0 - 17.0 g/dL   HCT 41.2 39.0 - 52.0 %   MCV 88.2 78.0 - 100.0 fL   MCH 30.8 26.0 - 34.0 pg   MCHC 35.0 30.0 - 36.0 g/dL   RDW 13.1 11.5 - 15.5 %   Platelets 197 150 - 400 K/uL   Neutrophils Relative % 68 %   Neutro Abs 3.5 1.7 - 7.7 K/uL   Lymphocytes Relative 22 %   Lymphs Abs 1.1 0.7 - 4.0 K/uL   Monocytes Relative 9 %   Monocytes Absolute 0.4 0.1 - 1.0 K/uL   Eosinophils Relative 1 %   Eosinophils Absolute 0.1 0.0 - 0.7 K/uL   Basophils Relative 0 %   Basophils Absolute 0.0 0.0 - 0.1 K/uL  Comprehensive metabolic panel     Status: Abnormal   Collection Time: 07/14/15 10:31 AM  Result Value Ref Range   Sodium 143 135 - 145 mmol/L   Potassium 4.1 3.5 - 5.1 mmol/L   Chloride 110 101 - 111 mmol/L   CO2 23 22 - 32 mmol/L   Glucose, Bld 121 (H) 65 - 99 mg/dL   BUN 13 6 - 20 mg/dL   Creatinine, Ser 1.64 (H) 0.61 - 1.24 mg/dL   Calcium 9.4 8.9 - 10.3 mg/dL   Total Protein 7.7 6.5 - 8.1 g/dL   Albumin 4.3 3.5 - 5.0 g/dL   AST 24 15 - 41 U/L   ALT 27 17 - 63 U/L   Alkaline Phosphatase 42 38 - 126 U/L   Total Bilirubin 0.6 0.3 - 1.2 mg/dL   GFR calc non Af Amer 51 (L) >60 mL/min   GFR calc Af Amer 59 (L) >60 mL/min    Comment: (NOTE) The eGFR has been calculated using the CKD EPI equation. This calculation has not been validated in all clinical situations. eGFR's persistently <60  mL/min signify possible Chronic Kidney Disease.    Anion gap 10 5 - 15  Ethanol     Status: None   Collection Time: 07/14/15 10:31 AM  Result Value Ref Range   Alcohol, Ethyl (B) <5 <5 mg/dL    Comment:        LOWEST DETECTABLE LIMIT FOR SERUM ALCOHOL IS 5 mg/dL  FOR MEDICAL PURPOSES ONLY   Urine rapid drug screen (hosp performed)     Status: None   Collection Time: 07/14/15 10:31 AM  Result Value Ref Range   Opiates NONE DETECTED NONE DETECTED   Cocaine NONE DETECTED NONE DETECTED   Benzodiazepines NONE DETECTED NONE DETECTED   Amphetamines NONE DETECTED NONE DETECTED   Tetrahydrocannabinol NONE DETECTED NONE DETECTED   Barbiturates NONE DETECTED NONE DETECTED    Comment:        DRUG SCREEN FOR MEDICAL PURPOSES ONLY.  IF CONFIRMATION IS NEEDED FOR ANY PURPOSE, NOTIFY LAB WITHIN 5 DAYS.        LOWEST DETECTABLE LIMITS FOR URINE DRUG SCREEN Drug Class       Cutoff (ng/mL) Amphetamine      1000 Barbiturate      200 Benzodiazepine   793 Tricyclics       903 Opiates          300 Cocaine          300 THC              50   Salicylate level     Status: None   Collection Time: 07/14/15 10:31 AM  Result Value Ref Range   Salicylate Lvl <0.0 2.8 - 30.0 mg/dL  Acetaminophen level     Status: Abnormal   Collection Time: 07/14/15 10:31 AM  Result Value Ref Range   Acetaminophen (Tylenol), Serum <10 (L) 10 - 30 ug/mL    Comment:        THERAPEUTIC CONCENTRATIONS VARY SIGNIFICANTLY. A RANGE OF 10-30 ug/mL MAY BE AN EFFECTIVE CONCENTRATION FOR MANY PATIENTS. HOWEVER, SOME ARE BEST TREATED AT CONCENTRATIONS OUTSIDE THIS RANGE. ACETAMINOPHEN CONCENTRATIONS >150 ug/mL AT 4 HOURS AFTER INGESTION AND >50 ug/mL AT 12 HOURS AFTER INGESTION ARE OFTEN ASSOCIATED WITH TOXIC REACTIONS.   Valproic acid level     Status: None   Collection Time: 07/14/15 10:31 AM  Result Value Ref Range   Valproic Acid Lvl 62 50.0 - 100.0 ug/mL    Current Facility-Administered Medications   Medication Dose Route Frequency Provider Last Rate Last Dose  . acetaminophen (TYLENOL) tablet 650 mg  650 mg Oral Q4H PRN Larene Pickett, PA-C      . alum & mag hydroxide-simeth (MAALOX/MYLANTA) 200-200-20 MG/5ML suspension 30 mL  30 mL Oral PRN Larene Pickett, PA-C      . amLODipine (NORVASC) tablet 10 mg  10 mg Oral Daily Larene Pickett, PA-C   10 mg at 07/15/15 1016  . busPIRone (BUSPAR) tablet 5 mg  5 mg Oral TID Larene Pickett, PA-C   5 mg at 07/15/15 1016  . clonazePAM (KLONOPIN) tablet 0.5 mg  0.5 mg Oral BID PRN Larene Pickett, PA-C   0.5 mg at 07/14/15 1522  . diphenhydrAMINE (BENADRYL) capsule 25 mg  25 mg Oral Q4H PRN Lacretia Leigh, MD   25 mg at 07/15/15 1142  . divalproex (DEPAKOTE ER) 24 hr tablet 500 mg  500 mg Oral BID Larene Pickett, PA-C   500 mg at 07/15/15 1017  . glycopyrrolate (ROBINUL) tablet 2 mg  2 mg Oral TID Larene Pickett, PA-C   2 mg at 07/15/15 1015  . ibuprofen (ADVIL,MOTRIN) tablet 600 mg  600 mg Oral Q8H PRN Larene Pickett, PA-C   600 mg at 07/15/15 9233  . levETIRAcetam (KEPPRA) tablet 500 mg  500 mg Oral BID Larene Pickett, PA-C   500 mg at 07/15/15 1016  .  LORazepam (ATIVAN) tablet 1 mg  1 mg Oral Q8H PRN Larene Pickett, PA-C      . lurasidone (LATUDA) tablet 60 mg  60 mg Oral QHS Larene Pickett, PA-C   60 mg at 07/14/15 2115  . methylPREDNISolone sodium succinate (SOLU-MEDROL) 125 mg/2 mL injection 125 mg  125 mg Intramuscular Once [employer], PA-C   125 mg at 07/15/15 2080  . metoprolol tartrate (LOPRESSOR) tablet 25 mg  25 mg Oral BID Larene Pickett, PA-C   25 mg at 07/15/15 1016  . mirtazapine (REMERON) tablet 15 mg  15 mg Oral QHS Larene Pickett, PA-C   15 mg at 07/14/15 2127  . nicotine (NICODERM CQ - dosed in mg/24 hours) patch 21 mg  21 mg Transdermal Daily Larene Pickett, PA-C   21 mg at 07/15/15 1015  . ondansetron (ZOFRAN) tablet 4 mg  4 mg Oral Q8H PRN Larene Pickett, PA-C      . QUEtiapine (SEROQUEL) tablet 50 mg  50 mg Oral BID Larene Pickett, PA-C   50 mg at 07/15/15 1016  . zolpidem (AMBIEN) tablet 5 mg  5 mg Oral QHS PRN Larene Pickett, PA-C   5 mg at 07/15/15 0219   Current Outpatient Prescriptions  Medication Sig Dispense Refill  . amLODipine (NORVASC) 10 MG tablet Take 1 tablet (10 mg total) by mouth daily. 30 tablet 0  . busPIRone (BUSPAR) 5 MG tablet Take 5 mg by mouth 3 (three) times daily.   2  . clonazePAM (KLONOPIN) 0.5 MG tablet Take 1 tablet (0.5 mg total) by mouth 2 (two) times daily as needed (anxiety). (Patient taking differently: Take 0.5 mg by mouth daily as needed for anxiety. ) 30 tablet 0  . divalproex (DEPAKOTE ER) 500 MG 24 hr tablet Take 1 tablet (500 mg total) by mouth 2 (two) times daily. 60 tablet 11  . glycopyrrolate (ROBINUL) 2 MG tablet Take 2 mg by mouth 3 (three) times daily.    Marland Kitchen levETIRAcetam (KEPPRA) 500 MG tablet Take 1 tablet (500 mg total) by mouth 2 (two) times daily. 60 tablet 11  . lurasidone 60 MG TABS Take 60 mg by mouth at bedtime. (Patient taking differently: Take 60 mg by mouth daily. ) 30 tablet 0  . metoprolol tartrate (LOPRESSOR) 25 MG tablet Take 1 tablet (25 mg total) by mouth 2 (two) times daily. 60 tablet 0  . mirtazapine (REMERON) 15 MG tablet Take 1 tablet (15 mg total) by mouth at bedtime. 30 tablet 0  . QUEtiapine (SEROQUEL) 50 MG tablet 2 (two) times daily.  2    Musculoskeletal: Strength & Muscle Tone: within normal limits Gait & Station: normal Patient leans: N/A  Psychiatric Specialty Exam: Review of Systems  Unable to perform ROS: mental acuity  HENT: Negative.     Blood pressure 123/75, pulse 76, temperature 98.2 F (36.8 C), temperature source Oral, resp. rate 18, SpO2 97 %.There is no weight on file to calculate BMI.  General Appearance: Casual and Disheveled  Eye Contact::  Good  Speech:  Garbled and MINIMAL, COULD NOT ANSWER QUESTIONS.  Volume:  Normal  Mood:  Angry and Anxious  Affect:  Blunt  Thought Process:  Linear  Orientation:  Full  (Time, Place, and Person)  Thought Content:  Unable to obtain, patient could not engage in the conversation.  Suicidal Thoughts:  No  Homicidal Thoughts:  No  Memory:  Immediate;   Poor Recent;   Poor Remote;  Poor  Judgement:  Poor  Insight:  Shallow  Psychomotor Activity:  Psychomotor Retardation  Concentration:  Fair  Recall:  Poor  Fund of Knowledge:Poor  Language: Poor  Akathisia:  NA  Handed:  Right  AIMS (if indicated):     Assets:  Desire for Improvement  ADL's:  Impaired  Cognition: WNL  Sleep:      Treatment Plan Summary: Daily contact with patient to assess and evaluate symptoms and progress in treatment and Medication management  Disposition:   Accepted for admission and we will be seeking placement at any facility with available bed.  We have resumed his home medications.  Delfin Gant, NP     PMHNP-BC  07/15/2015 4:06 PM Patient seen face-to-face for psychiatric evaluation, chart reviewed and case discussed with the physician extender and developed treatment plan. Reviewed the information documented and agree with the treatment plan. Corena Pilgrim, MD

## 2015-07-15 NOTE — ED Notes (Signed)
Pt alert and oriented x3. Affect blunted, behavior appropriate, denies SI/HI.
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
## 2015-07-16 NOTE — BH Assessment (Signed)
BHH Assessment Progress Note  The following facilities have been contacted to seek placement for this pt, with results as noted:  Beds available, information sent, decision pending:  [facility]   [facility]  [facility]   Doylene Canninghomas Keisy Strickler, [place] Triage Specialist [phone]

## 2015-07-16 NOTE — Progress Notes (Signed)
Pt A & O to self and place. Presents with congruent affect and mood on initial contact. Visible in dayroom, engaged with peers for majority of this shift. Denies SI, HI, AVH and pain when assessed. Compliant with medications when offered. Cooperative with unit routines. Remains safe on unit. Q 15 minutes safety checks maintained without outburst or self injurious behavior to note at this time.

## 2015-07-16 NOTE — ED Notes (Signed)
Pt awake, alert & responsive, watching TV in dayroom, no distress noted. Calm & cooperative.  Monitoring for safety, Q 15 min checks in effect.

## 2015-07-16 NOTE — BHH Counselor (Signed)
07/16/2015 Reassessment:   Patient arrived to [facility] 07/14/2015. Per IVC paperwork, patient has not been taking his medications as he refuses when offered. He often leaves the facility and walks to the store independently which is not safe for him to do given his medical history. They report he has also become combative with staff and other residents. He is currently residing in a room with a blind gentleman and they are concerned what may happen if his behavior continues to escalate. Patient states when he leaves the facility he is "just going to the store for snacks".  Writer met with patient today. Patient was in the recreation room with other patient's. Writer observed patient interacting with others well. He as also laughing and making jokes. Writer asked patient to come to his room so that we may speak in private. He denies SI, HI, and AVH's. Patient is calm and cooperative. Patient is dressed in hospital scrubs. His affect is appropriate. His mood appear normal. Patient is oriented to person, time, place, and situation. Patient sleeping well. Patient sts that his appetite is normal.   Per Dr. Darleene Cleaver and Reginold Agent, NP, patient continues to meet inpatient criteria. Patient to be referred to outside facilities for placement.

## 2015-07-17 MED ORDER — MIRTAZAPINE 15 MG PO TABS: 15.0000 mg | ORAL_TABLET | Freq: Every day | ORAL | Status: DC

## 2015-07-17 MED ORDER — BUSPIRONE HCL 5 MG PO TABS: 5.0000 mg | ORAL_TABLET | Freq: Three times a day (TID) | ORAL | Status: DC

## 2015-07-17 MED ORDER — DIVALPROEX SODIUM ER 500 MG PO TB24: 500.0000 mg | ORAL_TABLET | Freq: Two times a day (BID) | ORAL | Status: DC

## 2015-07-17 MED ORDER — LURASIDONE HCL 60 MG PO TABS: 60.0000 mg | ORAL_TABLET | Freq: Every day | ORAL | Status: DC

## 2015-07-17 MED ORDER — AMLODIPINE BESYLATE 10 MG PO TABS: 10.0000 mg | ORAL_TABLET | Freq: Every day | ORAL | Status: DC

## 2015-07-17 MED ORDER — QUETIAPINE FUMARATE 50 MG PO TABS: 50.0000 mg | ORAL_TABLET | Freq: Two times a day (BID) | ORAL | Status: DC

## 2015-07-17 MED ORDER — METOPROLOL TARTRATE 25 MG PO TABS: 25.0000 mg | ORAL_TABLET | Freq: Two times a day (BID) | ORAL | Status: DC

## 2015-07-17 MED ORDER — CLONAZEPAM 0.5 MG PO TABS: 0.5000 mg | ORAL_TABLET | Freq: Two times a day (BID) | ORAL | Status: DC | PRN

## 2015-07-17 MED ORDER — LEVETIRACETAM 500 MG PO TABS: 500.0000 mg | ORAL_TABLET | Freq: Two times a day (BID) | ORAL | Status: DC
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
## 2015-07-17 NOTE — ED Notes (Signed)
Taxi at ED to transport pt home to mother per MD order. Taxi Voucher provided by TTS: [name]. This nurse called pt mother/legal guardian Glen Johnson to review discharge summary. Pt mother verbalizes understanding of follow up appointment and medication regimen. This nurse reviewed all RX with pt mother. Discharge summary and RX sent out with pt. Pt denies SI/HI. Denies AVH, Denies physical pain at discharge. Reports "I'm ready to go home." Pt signed e-signature. Escorted to lobby with Aggie Cosierheresa MHT. Ambulatory off unit.

## 2015-07-17 NOTE — ED Notes (Signed)
Pt behavior calm and cooperative, pleasant on approach. Pt compliant with prescribed medication regimen. Concrete thinking noted. Pt denies SI/HI, Denies AVH. Pt noted OOB in the day room. Special checks q 15 mins in place for safety. Video monitoring in place.

## 2015-07-17 NOTE — Consult Note (Signed)
Psychiatric Specialty Exam: Physical Exam  ROS  Blood pressure 121/71, pulse 120, temperature 97.9 F (36.6 C), temperature source Oral, resp. rate 16, SpO2 100 %.There is no weight on file to calculate BMI.  General Appearance: Casual  Eye Contact::  Good  Speech:  Clear and Coherent  Volume:  Normal  Mood:  Euthymic  Affect:  Congruent  Thought Process:  Coherent and Goal Directed  Orientation:  Full (Time, Place, and Person)  Thought Content:  WDL  Suicidal Thoughts:  No  Homicidal Thoughts:  No  Memory:  Immediate;   Fair Recent;   Fair Remote;   Fair  Judgement:  Fair  Insight:  Good  Psychomotor Activity:  Normal  Concentration:  Good  Recall:  Fair  Fund of Knowledge:  Fair  Language:  Good  Akathisia:  No  Handed:  Right  AIMS (if indicated):     Assets:  Desire for Improvement Housing  ADL's:  Intact  Cognition:  WNL  Sleep:      We have not had any behavior issues with this patient.  He spends time in the TV room with other patients.  He is compliant with his medications.  He is calm and cooperative and answers questions asked of him.  He is ready for discharge home but his mother is refusing to take him and his group home is not taking him back.  SW is involved in securing him a place.  His mother is not accepting calls from staff. Schizophrenia, unspecified type [facility](HCC)   Plan: Discharge home, patient to follow up with [facility].  [name] Onuoha    PMHNP-BC Patient seen face-to-face for psychiatric evaluation, chart reviewed and case discussed with the physician extender and developed treatment plan. Reviewed the information documented and agree with the treatment plan. Thedore MinsMojeed Marita Burnsed, MD
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
## 2015-07-17 NOTE — BHH Counselor (Signed)
BHH Assessment Progress Note  Writer rec'd a call from pt's mother, Fredrich RomansGloria Connery, at @1520 , instructing ED staff to put pt in a cab to her house. Mom ver'd her address. Information given to [name] Onuoha, PMH-NP and LCSW, [name] to facilitate.   Johny ShockSamantha M. Ladona Ridgelaylor, MS, NCC, LPCA Counselor

## 2015-07-17 NOTE — Progress Notes (Signed)
CSW was notified by TTS disposition that pt is up for discharge, but mom cannot pick up pt.  CSW reached out to mom who stats that she cannot pick up pt due to her not being able to drive. Mom states that pt is welcomed to come back home.  CSW met with pt at bedside along with NP. Patient was alert, oriented, and cooperative at bedside. CSW informed pt that mom is agreeable that he can come back home. Patient states that he feels safe to return home. Patient states that he feels safe to ride home in a taxi. Mom is aware that pt will come back home in [place]. Mom confirmed address for pt.  NP states that she feels pt is safe to ride in a taxi. CSW made nurse aware.  Willette Brace [phone] ED CSW 07/17/2015 5:34 PM

## 2015-07-17 NOTE — Progress Notes (Addendum)
2:05pm- At the request of NP, CSW attempted a telephone call to speak with patient's mother, Fredrich RomansGloria Culliver to pick patient up, as he has been psychiatrically cleared for discharge. Mother's phone immediately went to voicemail. CSW left a message with name and contact number for return call.  3:17pm- CSW attempted another telephone call to patient's mother. Mother's phone immediately went to voicemail. Message left with name and contact number for return call.    Fredrich RomansGloria Misener, mother [phone]  Elenore PaddyLaVonia Jaydeen Darley, LCSWA [phone] ED CSW 07/17/2015 2:08 PM

## 2015-08-18 ENCOUNTER — Encounter: Payer: Self-pay | Admitting: Adult Health

## 2015-08-18 ENCOUNTER — Ambulatory Visit (INDEPENDENT_AMBULATORY_CARE_PROVIDER_SITE_OTHER): Payer: Medicaid Other | Admitting: Adult Health

## 2015-08-18 VITALS — BP 113/78 | HR 62 | Ht 69.0 in | Wt 200.2 lb

## 2015-08-18 DIAGNOSIS — F2089 Other schizophrenia: Secondary | ICD-10-CM | POA: Diagnosis not present

## 2015-08-18 DIAGNOSIS — R569 Unspecified convulsions: Secondary | ICD-10-CM | POA: Diagnosis not present

## 2015-08-18 DIAGNOSIS — Z5181 Encounter for therapeutic drug level monitoring: Secondary | ICD-10-CM

## 2015-08-18 NOTE — Patient Instructions (Signed)
Continue Keppra and depakote Blood work today If your symptoms worsen or you develop new symptoms please let us know.

## 2015-08-18 NOTE — Progress Notes (Signed)
PATIENT: Glen Johnson DOB: November 14, 1975  REASON FOR VISIT: follow up HISTORY FROM: patient  HISTORY OF PRESENT ILLNESS: UPDATE 08/18/15 (MM):  Glen Johnson is a 40 year old male with a history of seizures and schizophrenia. He returns today for follow-up. His mother is with him today. She reports that he had a seizure in March. The patient was taken to the emergency room. At the emergency room he reported that he had not been compliant with his medication. His Depakote level was low. The patient does have anger outbursts that his mother cannot always control. She states that getting him to complete ADLs can sometimes be a "struggle." He does not operate a motor vehicle. He is currently living with his mother however in the past he has been in several group homes but each time has been evicted. He returns today for an evaluation.  HISTORY: Glen Johnson is 40 yo RH with his mother, seen in refer by his primary care physician Dr.Edwin Concepcion Elk, MD in Dec 30 2014 for evaluation of seizure.  He had a history of traumatic brain injury in 2012, was treated at [facility], stating hospital for 2 and half months, required tracheostomy, PEG tube, prolonged rehabilitation, had slurred speech, gait difficulty ever since.  He had long-standing history of schizophrenia, often stayed away from his family, was found on the street, ever since the brain trauma, he seems to have more spells of seizure-like activity, sudden loss of consciousness, patient was not able to provide detailed history, he is amnestic of his traumatic brain injury event, he now lives with his mother now, reported his anger control problem, last seizure was in August 2016,   He is now on polypharmacy treatment, including Latuda 80 mg, seroquel 50 mg twice a day, Remeron 15 mg at bedtime, clonazepam, also Depakote ER 500 mg twice a day, Keppra 500 mg twice a day, BuSpar 5 mg daily  He is tolerating his  medications, overall fairly stable, I have reviewed his most recent psychiatric visit, carried a diagnosis of schizophrenia, violent behavior, Adjustment disorder with mixed disturbance of emotions and conduct  UPDATE Dec 5th 2016: I have personally reviewed MRI brain w/wo in Nov 2016, that was normal. He fell in Nov 2016 from ground level, no injury. No recurrent seizure, He is now taking Depakote ER  bid, keppra  bid. He forgets to take his medication sometimes.   He had gradual worsening gait difficulty since 2014, wide based unsteady gait. Mother report family history of disabilitating arthritis. Has no bowel and bladder incontinence     REVIEW OF SYSTEMS: Out of a complete 14 system review of symptoms, the patient complains only of the following symptoms, and all other reviewed systems are negative.  Fatigue, cough  ALLERGIES: No Known Allergies  HOME MEDICATIONS: Outpatient Prescriptions Prior to Visit  Medication Sig Dispense Refill  . amLODipine (NORVASC) 10 MG tablet Take 1 tablet (10 mg total) by mouth daily. 30 tablet 0  . busPIRone (BUSPAR) 5 MG tablet Take 1 tablet (5 mg total) by mouth 3 (three) times daily. 90 tablet 0  . busPIRone (BUSPAR) 5 MG tablet Take 1 tablet (5 mg total) by mouth 3 (three) times daily. 90 tablet 0  . clonazePAM (KLONOPIN) 0.5 MG tablet Take 1 tablet (0.5 mg total) by mouth 2 (two) times daily as needed (anxiety). 30 tablet 0  . divalproex (DEPAKOTE ER) 500 MG 24 hr tablet Take 1 tablet (500 mg total) by mouth 2 (  two) times daily. 60 tablet 0  . glycopyrrolate (ROBINUL) 2 MG tablet Take 2 mg by mouth 3 (three) times daily.    Marland Kitchen. levETIRAcetam (KEPPRA) 500 MG tablet Take 1 tablet (500 mg total) by mouth 2 (two) times daily. 60 tablet 0  . lurasidone 60 MG TABS Take 60 mg by mouth at bedtime. 30 tablet 0  . metoprolol tartrate (LOPRESSOR) 25 MG tablet Take 1 tablet (25 mg total) by mouth 2 (two) times daily.  0  . metoprolol tartrate  (LOPRESSOR) 25 MG tablet Take 1 tablet (25 mg total) by mouth 2 (two) times daily.  0  . mirtazapine (REMERON) 15 MG tablet Take 1 tablet (15 mg total) by mouth at bedtime. 30 tablet 0  . mirtazapine (REMERON) 15 MG tablet Take 1 tablet (15 mg total) by mouth at bedtime. 30 tablet 0  . QUEtiapine (SEROQUEL) 50 MG tablet Take 1 tablet (50 mg total) by mouth 2 (two) times daily. 60 tablet 0  . amLODipine (NORVASC) 10 MG tablet Take 1 tablet (10 mg total) by mouth daily. 30 tablet 0   No facility-administered medications prior to visit.    PAST MEDICAL HISTORY: Past Medical History  Diagnosis Date  . Seizures (HCC)   . Hypertension   . TBI (traumatic brain injury) (HCC)   . Bipolar disorder (HCC)   . Schizophrenia (HCC)   . Renal disorder     chronic kidney disease, stage II  . Depression     PAST SURGICAL HISTORY: Past Surgical History  Procedure Laterality Date  . Tracheostomy    . Tracheostomy closure      FAMILY HISTORY: Family History  Problem Relation Age of Onset  . Seizures Mother   . Migraines Mother   . Arthritis Mother     SOCIAL HISTORY: Social History   Social History  . Marital Status: Single    Spouse Name: N/A  . Number of Children: 2  . Years of Education: 12   Occupational History  . Disabled    Social History Main Topics  . Smoking status: Former Games developermoker  . Smokeless tobacco: Not on file  . Alcohol Use: No     Comment: Stopped one year ago.  . Drug Use: No     Comment: Stopped one year ago.  Marland Kitchen. Sexual Activity: Not on file   Other Topics Concern  . Not on file   Social History Narrative   Lives at home with his mother.   No caffeine use.   Right-handed.      PHYSICAL EXAM  Filed Vitals:   08/18/15 0907  BP: 113/78  Pulse: 62  Height: 5\' 9"  (1.753 m)  Weight: 200 lb 4 oz (90.833 kg)   Body mass index is 29.56 kg/(m^2).  Generalized: Well developed, in no acute distress   Neurological examination  Mentation: Alert oriented  to time, place, history taking. Follows all commands speech is slightly dysarthric.   Cranial nerve II-XII: Pupils were equal round reactive to light. Extraocular movements were full, visual field were full on confrontational test. Facial sensation and strength were normal. Uvula tongue midline. Head turning and shoulder shrug  were normal and symmetric. Motor: The motor testing reveals 5 over 5 strength of all 4 extremities. Good symmetric motor tone is noted throughout.  Sensory: Sensory testing is intact to soft touch on all 4 extremities. No evidence of extinction is noted.  Coordination: Cerebellar testing reveals good finger-nose-finger and heel-to-shin bilaterally.  Gait and station: Gait is  wide based. Tandem gait not attemted. Reflexes: Deep tendon reflexes are symmetric and normal bilaterally.   DIAGNOSTIC DATA (LABS, IMAGING, TESTING) - I reviewed patient records, labs, notes, testing and imaging myself where available.  Lab Results  Component Value Date   WBC 5.1 07/14/2015   HGB 14.4 07/14/2015   HCT 41.2 07/14/2015   MCV 88.2 07/14/2015   PLT 197 07/14/2015      Component Value Date/Time   NA 143 07/14/2015 1031   K 4.1 07/14/2015 1031   CL 110 07/14/2015 1031   CO2 23 07/14/2015 1031   GLUCOSE 121* 07/14/2015 1031   BUN 13 07/14/2015 1031   CREATININE 1.64* 07/14/2015 1031   CALCIUM 9.4 07/14/2015 1031   PROT 7.7 07/14/2015 1031   ALBUMIN 4.3 07/14/2015 1031   AST 24 07/14/2015 1031   ALT 27 07/14/2015 1031   ALKPHOS 42 07/14/2015 1031   BILITOT 0.6 07/14/2015 1031   GFRNONAA 51* 07/14/2015 1031   GFRAA 59* 07/14/2015 1031      ASSESSMENT AND PLAN 40 y.o. year old male  has a past medical history of Seizures (HCC); Hypertension; TBI (traumatic brain injury) (HCC); Bipolar disorder (HCC); Schizophrenia (HCC); Renal disorder; and Depression. here with:  1. Seizures 2. Schizophrenia  The patient has been instructed that he should take his medications daily.  I advised his mother that she may want to ensure that he actually takes his medication. She verbalized understanding. I will check drug levels today. Patient and his mother advised that if he has any additional seizures he should let us know. He will follow-up in months or sooner if needed.   Butch Penny, MSN, NP-C 08/18/2015, 9:07 AM [facility] [address], [address] [place], [place] [address] [phone]

## 2015-08-20 LAB — LEVETIRACETAM LEVEL: Levetiracetam Lvl: 19.9 ug/mL (ref 10.0–40.0)

## 2015-08-20 LAB — VALPROIC ACID LEVEL: VALPROIC ACID LVL: 77 ug/mL (ref 50–100)

## 2015-08-21 ENCOUNTER — Telehealth: Payer: Self-pay | Admitting: *Deleted

## 2015-08-21 NOTE — Telephone Encounter (Signed)
-----   Message from Butch PennyMegan Millikan, NP sent at 08/20/2015  2:31 PM EDT ----- Lab work ok. Please call patient.

## 2015-08-21 NOTE — Telephone Encounter (Signed)
LMVM for pt on his mother's VM [name](Gloria) ok per DPR.  His labs ok.  Please call back if questions.

## 2015-08-26 ENCOUNTER — Encounter (HOSPITAL_COMMUNITY): Payer: Self-pay | Admitting: *Deleted

## 2015-08-26 ENCOUNTER — Emergency Department (HOSPITAL_COMMUNITY)
Admission: EM | Admit: 2015-08-26 | Discharge: 2015-09-05 | Disposition: A | Discharge status: Home / Self Care | Payer: Medicaid Other | Attending: Emergency Medicine | Admitting: Emergency Medicine

## 2015-08-26 DIAGNOSIS — I129 Hypertensive chronic kidney disease with stage 1 through stage 4 chronic kidney disease, or unspecified chronic kidney disease: Secondary | ICD-10-CM | POA: Insufficient documentation

## 2015-08-26 DIAGNOSIS — Z87891 Personal history of nicotine dependence: Secondary | ICD-10-CM | POA: Insufficient documentation

## 2015-08-26 DIAGNOSIS — F2089 Other schizophrenia: Secondary | ICD-10-CM | POA: Insufficient documentation

## 2015-08-26 DIAGNOSIS — N182 Chronic kidney disease, stage 2 (mild): Secondary | ICD-10-CM | POA: Diagnosis not present

## 2015-08-26 DIAGNOSIS — Z79899 Other long term (current) drug therapy: Secondary | ICD-10-CM | POA: Diagnosis not present

## 2015-08-26 DIAGNOSIS — F319 Bipolar disorder, unspecified: Secondary | ICD-10-CM | POA: Diagnosis not present

## 2015-08-26 DIAGNOSIS — F209 Schizophrenia, unspecified: Secondary | ICD-10-CM | POA: Diagnosis present

## 2015-08-26 DIAGNOSIS — F4325 Adjustment disorder with mixed disturbance of emotions and conduct: Secondary | ICD-10-CM | POA: Diagnosis not present

## 2015-08-26 LAB — CBC
HEMATOCRIT: 41.3 % (ref 39.0–52.0)
HEMOGLOBIN: 14.6 g/dL (ref 13.0–17.0)
MCH: 31.1 pg (ref 26.0–34.0)
MCHC: 35.4 g/dL (ref 30.0–36.0)
MCV: 88.1 fL (ref 78.0–100.0)
Platelets: 200 10*3/uL (ref 150–400)
RBC: 4.69 MIL/uL (ref 4.22–5.81)
RDW: 13.2 % (ref 11.5–15.5)
WBC: 5 10*3/uL (ref 4.0–10.5)

## 2015-08-26 LAB — RAPID URINE DRUG SCREEN, HOSP PERFORMED
Amphetamines: NOT DETECTED
BENZODIAZEPINES: NOT DETECTED
Barbiturates: NOT DETECTED
COCAINE: NOT DETECTED
OPIATES: NOT DETECTED
Tetrahydrocannabinol: NOT DETECTED

## 2015-08-26 LAB — COMPREHENSIVE METABOLIC PANEL
ALBUMIN: 4.5 g/dL (ref 3.5–5.0)
ALK PHOS: 43 U/L (ref 38–126)
ALT: 34 U/L (ref 17–63)
ANION GAP: 8 (ref 5–15)
AST: 33 U/L (ref 15–41)
BUN: 18 mg/dL (ref 6–20)
CALCIUM: 9.3 mg/dL (ref 8.9–10.3)
CHLORIDE: 103 mmol/L (ref 101–111)
CO2: 25 mmol/L (ref 22–32)
Creatinine, Ser: 2.03 mg/dL — ABNORMAL HIGH (ref 0.61–1.24)
GFR calc non Af Amer: 39 mL/min — ABNORMAL LOW (ref >60)
GFR, EST AFRICAN AMERICAN: 46 mL/min — AB (ref >60)
GLUCOSE: 87 mg/dL (ref 65–99)
POTASSIUM: 4.1 mmol/L (ref 3.5–5.1)
SODIUM: 136 mmol/L (ref 135–145)
Total Bilirubin: 0.7 mg/dL (ref 0.3–1.2)
Total Protein: 8.2 g/dL — ABNORMAL HIGH (ref 6.5–8.1)

## 2015-08-26 LAB — SALICYLATE LEVEL

## 2015-08-26 LAB — ETHANOL: Alcohol, Ethyl (B): <5 mg/dL (ref <5)

## 2015-08-26 LAB — ACETAMINOPHEN LEVEL

## 2015-08-26 MED ORDER — ACETAMINOPHEN 325 MG PO TABS: 650.0000 mg | ORAL_TABLET | ORAL | Status: DC | PRN

## 2015-08-26 MED ORDER — NICOTINE 21 MG/24HR TD PT24
21.0000 mg | MEDICATED_PATCH | Freq: Every day | TRANSDERMAL | Status: DC
  Filled 2015-08-26: qty 1

## 2015-08-26 MED ORDER — ALUM & MAG HYDROXIDE-SIMETH 200-200-20 MG/5ML PO SUSP: 30.0000 mL | ORAL | Status: DC | PRN

## 2015-08-26 MED ORDER — ZOLPIDEM TARTRATE 5 MG PO TABS: 5.0000 mg | ORAL_TABLET | Freq: Every evening | ORAL | Status: DC | PRN

## 2015-08-26 MED ORDER — LORAZEPAM 1 MG PO TABS: 1.0000 mg | ORAL_TABLET | Freq: Three times a day (TID) | ORAL | Status: DC | PRN

## 2015-08-26 MED ORDER — IBUPROFEN 200 MG PO TABS: 600.0000 mg | ORAL_TABLET | Freq: Three times a day (TID) | ORAL | Status: DC | PRN

## 2015-08-26 MED ORDER — ONDANSETRON HCL 4 MG PO TABS: 4.0000 mg | ORAL_TABLET | Freq: Three times a day (TID) | ORAL | Status: DC | PRN

## 2015-08-26 NOTE — ED Provider Notes (Signed)
CSN: [id number]     Arrival date & time 08/26/15  1734 History   First MD Initiated Contact with Patient 08/26/15 1804     Chief Complaint  Patient presents with  . Medical Clearance     (Consider location/radiation/quality/duration/timing/severity/associated sxs/prior Treatment) HPI...Marland KitchenMarland KitchenLevel V caveat for schizophrenia. Patient has known schizophrenia, bipolar disorder, traumatic brain injury. IVC papers filled out by mother stating he has been aggressive and paranoid along with hearing voices.  Past Medical History  Diagnosis Date  . Seizures (HCC)   . Hypertension   . TBI (traumatic brain injury) (HCC)   . Bipolar disorder (HCC)   . Schizophrenia (HCC)   . Renal disorder     chronic kidney disease, stage II  . Depression    Past Surgical History  Procedure Laterality Date  . Tracheostomy    . Tracheostomy closure     Family History  Problem Relation Age of Onset  . Seizures Mother   . Migraines Mother   . Arthritis Mother    Social History  Substance Use Topics  . Smoking status: Former Games developer  . Smokeless tobacco: None  . Alcohol Use: No     Comment: Stopped one year ago.    Review of Systems  Reason unable to perform ROS: Psychiatric illness.      Allergies  Review of patient's allergies indicates no known allergies.  Home Medications   Prior to Admission medications   Medication Sig Start Date End Date Taking? Authorizing Provider  amLODipine (NORVASC) 10 MG tablet Take 1 tablet (10 mg total) by mouth daily. 06/28/14  Yes Earney Navy, NP  busPIRone (BUSPAR) 5 MG tablet Take 1 tablet (5 mg total) by mouth 3 (three) times daily. 07/17/15  Yes Earney Navy, NP  clonazePAM (KLONOPIN) 0.5 MG tablet Take 1 tablet (0.5 mg total) by mouth 2 (two) times daily as needed (anxiety). 07/17/15  Yes Earney Navy, NP  glycopyrrolate (ROBINUL) 2 MG tablet Take 2 mg by mouth 3 (three) times daily.   Yes Historical Provider, MD  levETIRAcetam (KEPPRA) 500  MG tablet Take 1 tablet (500 mg total) by mouth 2 (two) times daily. 07/17/15  Yes Earney Navy, NP  lurasidone 60 MG TABS Take 60 mg by mouth at bedtime. 07/17/15  Yes Earney Navy, NP  metoprolol tartrate (LOPRESSOR) 25 MG tablet Take 1 tablet (25 mg total) by mouth 2 (two) times daily. 07/17/15  Yes Earney Navy, NP  mirtazapine (REMERON) 15 MG tablet Take 1 tablet (15 mg total) by mouth at bedtime. 07/17/15  Yes Earney Navy, NP  QUEtiapine (SEROQUEL) 50 MG tablet Take 1 tablet (50 mg total) by mouth 2 (two) times daily. 07/17/15  Yes Earney Navy, NP  divalproex (DEPAKOTE ER) 500 MG 24 hr tablet Take 1 tablet (500 mg total) by mouth 2 (two) times daily. 07/17/15   Earney Navy, NP   BP 128/84 mmHg  Pulse 91  Temp(Src) 98.7 F (37.1 C) (Oral)  Resp 18  SpO2 96% Physical Exam  Constitutional: He is oriented to person, place, and time.  HENT:  Head: Normocephalic and atraumatic.  Eyes: Conjunctivae and EOM are normal. Pupils are equal, round, and reactive to light.  Neck: Normal range of motion. Neck supple.  Cardiovascular: Normal rate and regular rhythm.   Pulmonary/Chest: Effort normal and breath sounds normal.  Abdominal: Soft. Bowel sounds are normal.  Musculoskeletal: Normal range of motion.  Neurological: He is alert and oriented to person,  place, and time.  Skin: Skin is warm and dry.  Psychiatric:  Flight of ideas, tangential thinking  Nursing note and vitals reviewed.   ED Course  Procedures (including critical care time) Labs Review Labs Reviewed  COMPREHENSIVE METABOLIC PANEL - Abnormal; Notable for the following:    Creatinine, Ser 2.03 (*)    Total Protein 8.2 (*)    GFR calc non Af Amer 39 (*)    GFR calc Af Amer 46 (*)    All other components within normal limits  ACETAMINOPHEN LEVEL - Abnormal; Notable for the following:    Acetaminophen (Tylenol), Serum <10 (*)    All other components within normal limits  ETHANOL  SALICYLATE  LEVEL  CBC  URINE RAPID DRUG SCREEN, HOSP PERFORMED    Imaging Review No results found. I have personally reviewed and evaluated these images and lab results as part of my medical decision-making.   EKG Interpretation None      MDM   Final diagnoses:  Other schizophrenia (HCC)    Patient has known mental illness which includes paranoid schizophrenia, bipolar disorder.  Will obtain [facility] consult    Donnetta HutchingBrian Keni Elison, MD 08/26/15 2005

## 2015-08-26 NOTE — ED Notes (Signed)
Cigarettes, wht shoes, blk Pakistanjersey, wht socks, brown shorts.

## 2015-08-26 NOTE — BH Assessment (Addendum)
Assessment Note  Glen Johnson is an 40 y.o. male. Patient has a past medical history of Schizophrenia, Bipolar Disorder, Depression, and Traumatic Brain Injury. IVC papers filled out by mother stating he has been aggressive and paranoid along with hearing voices. Patient stating that he currently lives with mother. Sts that he took his television and put it on his mothers porch. Patient also tried to disassemble the television after putting it on the porch. Patient and mother argued about the television. Patient sts, "It's my television and can do what I want to do to it with it". Patient also stated, "I really did it to piss my mother off". He reports having multiple conflicts with his mother over the years. Pt denies SI/HI/AV. No history of suicide attempts or gestures.He does not appear to be responding to internal stimuli. When asked questions, Pt continually makes jokes and gives random answers. Patient denies depressive symptoms. He does not appear to be depressed as he smiles throughout the TTS assessment. He also denies anxiety symptoms. He denies stressors. Patient was recently at [facility] and stayed for 3 days due to behavioral issues and conflict with his mother. Patient was placed in a group home but walked away. Patient sts, "I went to the store that's all". He admits that he didn't tel anyone where he was going because, "I am grown and I don't have to tell people I'm going to the store".  Pt sts that he receives services outpatient services [facility]. He also goes to a Day program called the [facility].He is not compliant with medications. Sts, "I dont' think they help so I don't take them". He denies history of INPT mental health treatment. Denies alcohol and drug use.     Diagnosis: Schizophrenia, Bipolar Disorder, and Depression  Past Medical History:  Past Medical History  Diagnosis Date  . Seizures (HCC)   . Hypertension   . TBI (traumatic brain injury) (HCC)   . Bipolar  disorder (HCC)   . Schizophrenia (HCC)   . Renal disorder     chronic kidney disease, stage II  . Depression     Past Surgical History  Procedure Laterality Date  . Tracheostomy    . Tracheostomy closure      Family History:  Family History  Problem Relation Age of Onset  . Seizures Mother   . Migraines Mother   . Arthritis Mother     Social History:  reports that he has quit smoking. He does not have any smokeless tobacco history on file. He reports that he does not drink alcohol or use illicit drugs.  Additional Social History:  Alcohol / Drug Use Pain Medications: See MAR Prescriptions: See MAR Over the Counter: See MAR History of alcohol / drug use?: No history of alcohol / drug abuse Negative Consequences of Use: Personal relationships, Financial  CIWA: CIWA-Ar BP: 128/84 mmHg Pulse Rate: 91 COWS:    Allergies: No Known Allergies  Home Medications:  (Not in a hospital admission)  OB/GYN Status:  No LMP for male patient.  General Assessment Data Location of Assessment: [facility] TTS Assessment: In system Is this a Tele or Face-to-Face Assessment?: Face-to-Face Is this an Initial Assessment or a Re-assessment for this encounter?: Initial Assessment Marital status: Single Maiden name:  (n/a) Is patient pregnant?: No Pregnancy Status: No Living Arrangements: Group Home (currently living with mom; recently at group home) Can pt return to current living arrangement?: No Admission Status: Involuntary Is patient capable of signing voluntary admission?:  Yes Referral Source: Other Insurance type:  (Medicaid)  Medical Screening Exam [facility] Walk-in ONLY) Medical Exam completed: Yes  Crisis Care Plan Living Arrangements: Group Home (currently living with mom; recently at group home) Legal Guardian: Other: (Mother) [facility]  Name of Therapist: No provider reported.   Education Status Is patient currently in school?: No Current Grade:   (na) Highest grade of school patient has completed: na Name of school: na Contact person: na  Risk to self with the past 6 months Suicidal Ideation: No Has patient been a risk to self within the past 6 months prior to admission? : No Suicidal Intent: No Has patient had any suicidal intent within the past 6 months prior to admission? : No Is patient at risk for suicide?: No (No, patient ) Suicidal Plan?: No Has patient had any suicidal plan within the past 6 months prior to admission? : No Access to Means: No What has been your use of drugs/alcohol within the last 12 months?:  (Denies ) Previous Attempts/Gestures: No How many times?:  (0) Other Self Harm Risks:  (Denies ) Triggers for Past Attempts: None known Intentional Self Injurious Behavior: None Family Suicide History: No Recent stressful life event(s): Other (Comment) Persecutory voices/beliefs?: No Depression: Yes Depression Symptoms: Feeling angry/irritable, Feeling worthless/self pity, Loss of interest in usual pleasures, Isolating, Guilt, Fatigue, Tearfulness, Insomnia, Despondent Substance abuse history and/or treatment for substance abuse?: No Suicide prevention information given to non-admitted patients: Not applicable  Risk to Others within the past 6 months Homicidal Ideation: No Does patient have any lifetime risk of violence toward others beyond the six months prior to admission? : No Thoughts of Harm to Others: No Current Homicidal Intent: No Current Homicidal Plan: No Access to Homicidal Means: No Identified Victim:  (n/a) History of harm to others?: No Assessment of Violence: None Noted Violent Behavior Description:  (Denies ) Does patient have access to weapons?: No Criminal Charges Pending?: No Does patient have a court date: No Is patient on probation?: No  Psychosis Hallucinations: None noted Delusions: None noted  Mental Status Report Appearance/Hygiene: Unremarkable Eye Contact: Poor Motor  Activity: Freedom of movement Speech: Loud Level of Consciousness: Quiet/awake Mood: Other (Comment) (pleasant ) Affect: Blunted Anxiety Level: Minimal Thought Processes: Circumstantial Judgement: Partial Orientation: Unable to assess Obsessive Compulsive Thoughts/Behaviors: None  Cognitive Functioning Concentration: Decreased Memory: Recent Intact, Remote Intact IQ: Average Insight: Fair Impulse Control: Fair Appetite: Good Weight Loss:  (0) Weight Gain:  (0) Sleep: No Change Total Hours of Sleep:  (8 hrs of sleep ) Vegetative Symptoms: None  ADLScreening [facility] Assessment Services) Patient's cognitive ability adequate to safely complete daily activities?: Yes Patient able to express need for assistance with ADLs?: Yes Independently performs ADLs?: Yes (appropriate for developmental age)  Prior Inpatient Therapy Prior Inpatient Therapy: No Prior Therapy Dates: NA Prior Therapy Facilty/Provider(s): NA Reason for Treatment: NA  Prior Outpatient Therapy Prior Outpatient Therapy: Yes Prior Therapy Dates: 2016 Prior Therapy Facilty/Provider(s): [facility]  Reason for Treatment: Medication management  Does patient have an ACCT team?: No Does patient have Intensive In-House Services?  : No Does patient have [facility] services? : No Does patient have P4CC services?: No  ADL Screening (condition at time of admission) Patient's cognitive ability adequate to safely complete daily activities?: Yes Is the patient deaf or have difficulty hearing?: No Does the patient have difficulty seeing, even when wearing glasses/contacts?: No Does the patient have difficulty concentrating, remembering, or making decisions?: No Patient able to  express need for assistance with ADLs?: Yes Does the patient have difficulty dressing or bathing?: No Independently performs ADLs?: Yes (appropriate for developmental age) Does the patient have difficulty walking or climbing stairs?: No Weakness of Legs:  None Weakness of Arms/Hands: None  Home Assistive Devices/Equipment Home Assistive Devices/Equipment: None    Abuse/Neglect Assessment (Assessment to be complete while patient is alone) Physical Abuse: Denies Verbal Abuse: Denies Sexual Abuse: Denies Exploitation of patient/patient's resources: Denies Self-Neglect: Denies Values / Beliefs Cultural Requests During Hospitalization: None Spiritual Requests During Hospitalization: None   Advance Directives (For Healthcare) Does patient have an advance directive?: No Would patient like information on creating an advanced directive?: No - patient declined information Nutrition Screen- [facility] Patient's home diet: Regular  Additional Information 1:1 In Past 12 Months?: No CIRT Risk: No Elopement Risk: No Does patient have medical clearance?: Yes     Disposition:  Per Nanine Means, DNP, patient to remain in the ED overnight for observation. Patient to be evaluated by psychiatry in the morning.  On Site Evaluation by:   Reviewed with Physician:     Melynda Ripple [facility] 08/26/2015 8:09 PM

## 2015-08-26 NOTE — Progress Notes (Signed)
This writer completed a chart review for disposition.    Josh Nicolosi, MSW, LCSW, LCAS [facility] Triage Specialist [phone] 

## 2015-08-26 NOTE — ED Notes (Signed)
Pt BIB [facility] after pt's mother initiated IVC paperwork for aggressive behavior, paranoid schizophrenia

## 2015-08-27 DIAGNOSIS — F2089 Other schizophrenia: Secondary | ICD-10-CM

## 2015-08-27 DIAGNOSIS — F4325 Adjustment disorder with mixed disturbance of emotions and conduct: Secondary | ICD-10-CM | POA: Diagnosis not present

## 2015-08-27 MED ORDER — LURASIDONE HCL 40 MG PO TABS
60.0000 mg | ORAL_TABLET | Freq: Every day | ORAL | Status: DC
  Administered 2015-08-27 – 2015-09-04 (×9): 60 mg via ORAL
  Filled 2015-08-27 (×10): qty 1

## 2015-08-27 MED ORDER — AMLODIPINE BESYLATE 10 MG PO TABS
10.0000 mg | ORAL_TABLET | Freq: Every day | ORAL | Status: DC
  Administered 2015-08-29 – 2015-09-05 (×7): 10 mg via ORAL
  Filled 2015-08-27 (×9): qty 1

## 2015-08-27 MED ORDER — DIVALPROEX SODIUM ER 500 MG PO TB24
500.0000 mg | ORAL_TABLET | Freq: Two times a day (BID) | ORAL | Status: DC
  Administered 2015-08-27 – 2015-09-05 (×18): 500 mg via ORAL
  Filled 2015-08-27 (×18): qty 1

## 2015-08-27 MED ORDER — MIRTAZAPINE 30 MG PO TABS
15.0000 mg | ORAL_TABLET | Freq: Every day | ORAL | Status: DC
  Administered 2015-08-27 – 2015-09-04 (×9): 15 mg via ORAL
  Filled 2015-08-27 (×9): qty 1

## 2015-08-27 MED ORDER — BUSPIRONE HCL 10 MG PO TABS
5.0000 mg | ORAL_TABLET | Freq: Three times a day (TID) | ORAL | Status: DC
  Administered 2015-08-27 – 2015-09-05 (×27): 5 mg via ORAL
  Filled 2015-08-27 (×27): qty 1

## 2015-08-27 MED ORDER — GLYCOPYRROLATE 1 MG PO TABS
2.0000 mg | ORAL_TABLET | Freq: Three times a day (TID) | ORAL | Status: DC
  Administered 2015-08-27 – 2015-09-05 (×27): 2 mg via ORAL
  Filled 2015-08-27 (×28): qty 2

## 2015-08-27 MED ORDER — DIVALPROEX SODIUM ER 500 MG PO TB24
500.0000 mg | ORAL_TABLET | Freq: Two times a day (BID) | ORAL | Status: DC
Start: 2015-08-27 — End: 2015-08-27

## 2015-08-27 MED ORDER — CLONAZEPAM 0.5 MG PO TABS
0.5000 mg | ORAL_TABLET | Freq: Two times a day (BID) | ORAL | Status: DC | PRN
  Administered 2015-08-29: 0.5 mg via ORAL
  Filled 2015-08-27: qty 1

## 2015-08-27 MED ORDER — QUETIAPINE FUMARATE 50 MG PO TABS
50.0000 mg | ORAL_TABLET | Freq: Two times a day (BID) | ORAL | Status: DC
  Administered 2015-08-27 – 2015-09-05 (×18): 50 mg via ORAL
  Filled 2015-08-27 (×19): qty 1

## 2015-08-27 MED ORDER — LEVETIRACETAM 500 MG PO TABS
500.0000 mg | ORAL_TABLET | Freq: Two times a day (BID) | ORAL | Status: DC
  Administered 2015-08-27 – 2015-09-05 (×18): 500 mg via ORAL
  Filled 2015-08-27 (×19): qty 1

## 2015-08-27 MED ORDER — AMLODIPINE BESYLATE 10 MG PO TABS: 10.0000 mg | ORAL_TABLET | Freq: Every day | ORAL | Status: DC

## 2015-08-27 MED ORDER — METOPROLOL TARTRATE 25 MG PO TABS
25.0000 mg | ORAL_TABLET | Freq: Two times a day (BID) | ORAL | Status: DC
  Administered 2015-08-27 – 2015-09-05 (×16): 25 mg via ORAL
  Filled 2015-08-27 (×18): qty 1

## 2015-08-27 NOTE — BH Assessment (Addendum)
[facility] Assessment Progress Note  Per Thedore MinsMojeed Akintayo, MD, this pt does not require psychiatric hospitalization at this time.  He presents under IVC initiated by his mother, which Dr Jannifer FranklinAkintayo has rescinded.  [facility]  This has been included discharge instructions.  Pt's nurse, Morrie Sheldonshley, has been notified.  Doylene Canninghomas Jenesis Martin, MA Triage Specialist [phone]

## 2015-08-27 NOTE — ED Notes (Signed)
Jorene MinorsJameson, NP notified that pt is still pending d/c d/t pt mother has not come to unit. NP reports medication orders will be placed.. Awaiting orders at this time.

## 2015-08-27 NOTE — [facility] Suicide Risk Assessment (Signed)
Suicide Risk Assessment  Discharge Assessment   [facility]   Principal Problem: Adjustment disorder with mixed disturbance of emotions and conduct Discharge Diagnoses:  Patient Active Problem List   Diagnosis Date Noted  . Adjustment disorder with mixed disturbance of emotions and conduct [F43.25] 06/18/2014    Priority: High  . Schizophrenia (HCC) [F20.9]   . Seizures (HCC) [R56.9] 02/18/2015  . Abnormality of gait [R26.9] 02/17/2015  . Schizophrenia, unspecified type (HCC) [F20.9]   . Involuntary commitment [Z04.6]   . Violent behavior [R45.6]     Total Time spent with patient: 45 minutes  Musculoskeletal: Strength & Muscle Tone: within normal limits Gait & Station: normal Patient leans: N/A  Psychiatric Specialty Exam: Physical Exam  Constitutional: He is oriented to person, place, and time. He appears well-developed and well-nourished.  HENT:  Head: Normocephalic.  Neck: Normal range of motion.  Respiratory: Effort normal.  Musculoskeletal: Normal range of motion.  Neurological: He is alert and oriented to person, place, and time.  Skin: Skin is warm and dry.  Psychiatric: He has a normal mood and affect. His speech is normal and behavior is normal. Thought content normal. Cognition and memory are normal. He expresses impulsivity.    Review of Systems  Constitutional: Negative.   HENT: Negative.   Eyes: Negative.   Respiratory: Negative.   Cardiovascular: Negative.   Gastrointestinal: Negative.   Genitourinary: Negative.   Musculoskeletal: Negative.   Skin: Negative.   Neurological: Negative.   Endo/Heme/Allergies: Negative.   Psychiatric/Behavioral: Negative.     Blood pressure 127/86, pulse 73, temperature 98.3 F (36.8 C), temperature source Oral, resp. rate 18, SpO2 100 %.There is no weight on file to calculate BMI.  General Appearance: Casual  Eye Contact:  Good  Speech:  Normal Rate  Volume:  Normal  Mood:  Euthymic   Affect:  Congruent  Thought Process:  Coherent  Orientation:  Full (Time, Place, and Person)  Thought Content:  WDL  Suicidal Thoughts:  No  Homicidal Thoughts:  No  Memory:  Immediate;   Fair Recent;   Fair Remote;   Fair  Judgement:  Fair  Insight:  Fair  Psychomotor Activity:  Normal  Concentration:  Concentration: Fair and Attention Span: Fair  Recall:  [employer]iservFair  Fund of Knowledge:  Fair  Language:  Fair  Akathisia:  No  Handed:  Right  AIMS (if indicated):     Assets:  Housing Leisure Time Physical Health Resilience Social Support  ADL's:  Intact  Cognition:  Impaired,  Mild  Sleep:       Mental Status Per Nursing Assessment::   On Admission:     Demographic Factors:  Male and Adolescent or young adult  Loss Factors: NA  Historical Factors: Impulsivity  Risk Reduction Factors:   Sense of responsibility to family, Living with another person, especially a relative, Positive social support and Positive therapeutic relationship  Continued Clinical Symptoms:  None  Cognitive Features That Contribute To Risk:  None    Suicide Risk:  Minimal: No identifiable suicidal ideation.  Patients presenting with no risk factors but with morbid ruminations; may be classified as minimal risk based on the severity of the depressive symptoms    Plan Of Care/Follow-up recommendations:  Activity:  as tolerated Diet:  heart healthy diet  Glen Maqueda, NP 08/27/2015, 10:28 AM

## 2015-08-27 NOTE — Progress Notes (Signed)
This writer completed a chart review for disposition.    Cort Dragoo, MSW, LCSW, LCAS [facility] Triage Specialist [phone] 

## 2015-08-27 NOTE — ED Notes (Signed)
Pt in no acute distress,

## 2015-08-27 NOTE — Consult Note (Signed)
[facility] Consult   Reason for Consult:  Altercation with his mother Referring Physician:  EDP Patient Identification: Glen Johnson MRN:  [id number] Principal Diagnosis: Adjustment disorder with mixed disturbance of emotions and conduct Diagnosis:   Patient Active Problem List   Diagnosis Date Noted  . Adjustment disorder with mixed disturbance of emotions and conduct [F43.25] 06/18/2014    Priority: High  . Schizophrenia ([place]) [F20.9]   . Seizures ([place]) [R56.9] 02/18/2015  . Abnormality of gait [R26.9] 02/17/2015  . Schizophrenia, unspecified type ([place]) [F20.9]   . Involuntary commitment [Z04.6]   . Violent behavior [R45.6]     Total Time spent with patient: 45 minutes  Subjective:   Glen Johnson is a 40 y.o. male patient does not warrant admission.  HPI:  40 yo male who had a verbal altercation with his mother and had some aggression.  He has been calm and cooperative since arrival.  Rhian denies suicidal/homicidal ideations,hallucinations, and alcohol/drug abuse.  He is well known to these providers and is at his baseline.  Past Psychiatric History: schizophrenia  Risk to Self: Suicidal Ideation: No Suicidal Intent: No Is patient at risk for suicide?: No (No, patient ) Suicidal Plan?: No Access to Means: No What has been your use of drugs/alcohol within the last 12 months?:  (Denies ) How many times?:  (0) Other Self Harm Risks:  (Denies ) Triggers for Past Attempts: None known Intentional Self Injurious Behavior: None Risk to Others: Homicidal Ideation: No Thoughts of Harm to Others: No Current Homicidal Intent: No Current Homicidal Plan: No Access to Homicidal Means: No Identified Victim:  (n/a) History of harm to others?: No Assessment of Violence: None Noted Violent Behavior Description:  (Denies ) Does patient have access to weapons?: No Criminal Charges Pending?: No Does patient have a court date: No Prior Inpatient  Therapy: Prior Inpatient Therapy: No Prior Therapy Dates: NA Prior Therapy Facilty/Provider(s): NA Reason for Treatment: NA Prior Outpatient Therapy: Prior Outpatient Therapy: Yes Prior Therapy Dates: 2016 Prior Therapy Facilty/Provider(s): [facility]  Reason for Treatment: Medication management  Does patient have an ACCT team?: No Does patient have Intensive In-House Services?  : No Does patient have [facility] services? : No Does patient have P4CC services?: No  Past Medical History:  Past Medical History  Diagnosis Date  . Seizures ([place])   . Hypertension   . TBI (traumatic brain injury) ([place])   . Bipolar disorder ([place])   . Schizophrenia ([place])   . Renal disorder     chronic kidney disease, stage II  . Depression     Past Surgical History  Procedure Laterality Date  . Tracheostomy    . Tracheostomy closure     Family History:  Family History  Problem Relation Age of Onset  . Seizures Mother   . Migraines Mother   . Arthritis Mother    Family Psychiatric  History: none Social History:  History  Alcohol Use No    Comment: Stopped one year ago.     History  Drug Use No    Comment: Stopped one year ago.    Social History   Social History  . Marital Status: Single    Spouse Name: N/A  . Number of Children: 2  . Years of Education: 12   Occupational History  . Disabled    Social History Main Topics  . Smoking status: Former Research scientist (life sciences)  . Smokeless tobacco: None  . Alcohol Use: No     Comment: Stopped one  year ago.  . Drug Use: No     Comment: Stopped one year ago.  Marland Kitchen Sexual Activity: Not Asked   Other Topics Concern  . None   Social History Narrative   Lives at home with his mother.   No caffeine use.   Right-handed.   Additional Social History:    Allergies:  No Known Allergies  Labs:  Results for orders placed or performed during the hospital encounter of 08/26/15 (from the past 48 hour(s))  Comprehensive metabolic panel     Status: Abnormal    Collection Time: 08/26/15  6:00 PM  Result Value Ref Range   Sodium 136 135 - 145 mmol/L   Potassium 4.1 3.5 - 5.1 mmol/L   Chloride 103 101 - 111 mmol/L   CO2 25 22 - 32 mmol/L   Glucose, Bld 87 65 - 99 mg/dL   BUN 18 6 - 20 mg/dL   Creatinine, Ser 2.03 (H) 0.61 - 1.24 mg/dL   Calcium 9.3 8.9 - 10.3 mg/dL   Total Protein 8.2 (H) 6.5 - 8.1 g/dL   Albumin 4.5 3.5 - 5.0 g/dL   AST 33 15 - 41 U/L   ALT 34 17 - 63 U/L   Alkaline Phosphatase 43 38 - 126 U/L   Total Bilirubin 0.7 0.3 - 1.2 mg/dL   GFR calc non Af Amer 39 (L) >60 mL/min   GFR calc Af Amer 46 (L) >60 mL/min    Comment: (NOTE) The eGFR has been calculated using the CKD EPI equation. This calculation has not been validated in all clinical situations. eGFR's persistently <60 mL/min signify possible Chronic Kidney Disease.    Anion gap 8 5 - 15  Ethanol     Status: None   Collection Time: 08/26/15  6:00 PM  Result Value Ref Range   Alcohol, Ethyl (B) <5 <5 mg/dL    Comment:        LOWEST DETECTABLE LIMIT FOR SERUM ALCOHOL IS 5 mg/dL FOR MEDICAL PURPOSES ONLY   Salicylate level     Status: None   Collection Time: 08/26/15  6:00 PM  Result Value Ref Range   Salicylate Lvl <2.5 2.8 - 30.0 mg/dL  Acetaminophen level     Status: Abnormal   Collection Time: 08/26/15  6:00 PM  Result Value Ref Range   Acetaminophen (Tylenol), Serum <10 (L) 10 - 30 ug/mL    Comment:        THERAPEUTIC CONCENTRATIONS VARY SIGNIFICANTLY. A RANGE OF 10-30 ug/mL MAY BE AN EFFECTIVE CONCENTRATION FOR MANY PATIENTS. HOWEVER, SOME ARE BEST TREATED AT CONCENTRATIONS OUTSIDE THIS RANGE. ACETAMINOPHEN CONCENTRATIONS >150 ug/mL AT 4 HOURS AFTER INGESTION AND >50 ug/mL AT 12 HOURS AFTER INGESTION ARE OFTEN ASSOCIATED WITH TOXIC REACTIONS.   cbc     Status: None   Collection Time: 08/26/15  6:00 PM  Result Value Ref Range   WBC 5.0 4.0 - 10.5 K/uL   RBC 4.69 4.22 - 5.81 MIL/uL   Hemoglobin 14.6 13.0 - 17.0 g/dL   HCT 41.3 39.0 - 52.0 %    MCV 88.1 78.0 - 100.0 fL   MCH 31.1 26.0 - 34.0 pg   MCHC 35.4 30.0 - 36.0 g/dL   RDW 13.2 11.5 - 15.5 %   Platelets 200 150 - 400 K/uL  Rapid urine drug screen (hospital performed)     Status: None   Collection Time: 08/26/15  6:58 PM  Result Value Ref Range   Opiates NONE DETECTED NONE DETECTED  Cocaine NONE DETECTED NONE DETECTED   Benzodiazepines NONE DETECTED NONE DETECTED   Amphetamines NONE DETECTED NONE DETECTED   Tetrahydrocannabinol NONE DETECTED NONE DETECTED   Barbiturates NONE DETECTED NONE DETECTED    Comment:        DRUG SCREEN FOR MEDICAL PURPOSES ONLY.  IF CONFIRMATION IS NEEDED FOR ANY PURPOSE, NOTIFY LAB WITHIN 5 DAYS.        LOWEST DETECTABLE LIMITS FOR URINE DRUG SCREEN Drug Class       Cutoff (ng/mL) Amphetamine      1000 Barbiturate      200 Benzodiazepine   578 Tricyclics       469 Opiates          300 Cocaine          300 THC              50     Current Facility-Administered Medications  Medication Dose Route Frequency Provider Last Rate Last Dose  . acetaminophen (TYLENOL) tablet 650 mg  650 mg Oral Q4H PRN Nat Christen, MD      . alum & mag hydroxide-simeth (MAALOX/MYLANTA) 200-200-20 MG/5ML suspension 30 mL  30 mL Oral PRN Nat Christen, MD      . ibuprofen (ADVIL,MOTRIN) tablet 600 mg  600 mg Oral Q8H PRN Nat Christen, MD      . LORazepam (ATIVAN) tablet 1 mg  1 mg Oral Q8H PRN Nat Christen, MD      . nicotine (NICODERM CQ - dosed in mg/24 hours) patch 21 mg  21 mg Transdermal Daily Nat Christen, MD      . ondansetron [facility]) tablet 4 mg  4 mg Oral Q8H PRN Nat Christen, MD      . zolpidem (AMBIEN) tablet 5 mg  5 mg Oral QHS PRN Nat Christen, MD       Current Outpatient Prescriptions  Medication Sig Dispense Refill  . amLODipine (NORVASC) 10 MG tablet Take 1 tablet (10 mg total) by mouth daily. 30 tablet 0  . busPIRone (BUSPAR) 5 MG tablet Take 1 tablet (5 mg total) by mouth 3 (three) times daily. 90 tablet 0  . clonazePAM (KLONOPIN) 0.5 MG tablet Take  1 tablet (0.5 mg total) by mouth 2 (two) times daily as needed (anxiety). 30 tablet 0  . glycopyrrolate (ROBINUL) 2 MG tablet Take 2 mg by mouth 3 (three) times daily.    Marland Kitchen levETIRAcetam (KEPPRA) 500 MG tablet Take 1 tablet (500 mg total) by mouth 2 (two) times daily. 60 tablet 0  . lurasidone 60 MG TABS Take 60 mg by mouth at bedtime. 30 tablet 0  . metoprolol tartrate (LOPRESSOR) 25 MG tablet Take 1 tablet (25 mg total) by mouth 2 (two) times daily.  0  . mirtazapine (REMERON) 15 MG tablet Take 1 tablet (15 mg total) by mouth at bedtime. 30 tablet 0  . QUEtiapine (SEROQUEL) 50 MG tablet Take 1 tablet (50 mg total) by mouth 2 (two) times daily. 60 tablet 0  . divalproex (DEPAKOTE ER) 500 MG 24 hr tablet Take 1 tablet (500 mg total) by mouth 2 (two) times daily. 60 tablet 0    Musculoskeletal: Strength & Muscle Tone: within normal limits Gait & Station: normal Patient leans: N/A  Psychiatric Specialty Exam: Physical Exam  Constitutional: He is oriented to person, place, and time. He appears well-developed and well-nourished.  HENT:  Head: Normocephalic.  Neck: Normal range of motion.  Respiratory: Effort normal.  Musculoskeletal: Normal range of motion.  Neurological:  He is alert and oriented to person, place, and time.  Skin: Skin is warm and dry.  Psychiatric: He has a normal mood and affect. His speech is normal and behavior is normal. Thought content normal. Cognition and memory are normal. He expresses impulsivity.    Review of Systems  Constitutional: Negative.   HENT: Negative.   Eyes: Negative.   Respiratory: Negative.   Cardiovascular: Negative.   Gastrointestinal: Negative.   Genitourinary: Negative.   Musculoskeletal: Negative.   Skin: Negative.   Neurological: Negative.   Endo/Heme/Allergies: Negative.   Psychiatric/Behavioral: Negative.     Blood pressure 127/86, pulse 73, temperature 98.3 F (36.8 C), temperature source Oral, resp. rate 18, SpO2 100 %.There is  no weight on file to calculate BMI.  General Appearance: Casual  Eye Contact:  Good  Speech:  Normal Rate  Volume:  Normal  Mood:  Euthymic  Affect:  Congruent  Thought Process:  Coherent  Orientation:  Full (Time, Place, and Person)  Thought Content:  WDL  Suicidal Thoughts:  No  Homicidal Thoughts:  No  Memory:  Immediate;   Fair Recent;   Fair Remote;   Fair  Judgement:  Fair  Insight:  Fair  Psychomotor Activity:  Normal  Concentration:  Concentration: Fair and Attention Span: Fair  Recall:  [employer] of Knowledge:  Fair  Language:  Fair  Akathisia:  No  Handed:  Right  AIMS (if indicated):     Assets:  Housing Leisure Time Physical Health Resilience Social Support  ADL's:  Intact  Cognition:  Impaired,  Mild  Sleep:        Treatment Plan Summary: Daily contact with patient to assess and evaluate symptoms and progress in treatment, Medication management and Plan adjustment disorder with mixed disturbance of emotions and conduct:  -Crisis stabilization -Medication management:  Home medications not restarted due to his short stint in the ED and discharge home, encouraged him to continue his current medication regiment. -Individual counseling  Disposition: No evidence of imminent risk to self or others at present.    Waylan Boga, NP 08/27/2015 10:18 AM Patient seen face-to-face for psychiatric evaluation, chart reviewed and case discussed with the physician extender and developed treatment plan. Reviewed the information documented and agree with the treatment plan. Corena Pilgrim, MD

## 2015-08-27 NOTE — BH Assessment (Signed)
[facility] Assessment Progress Note  Per Mojeed Akintayo, MD, this pt does not require psychiatric hospitalization at this time.  Pt presents under IVC initiated by his mother, but rescinded by Dr Jannifer FranklinAkintayo.  [facility].  At the request of Dr Jannifer FranklinAkintayo this writer called pt's mother, Glen Johnson [phone]), who is also his legal guardian.  Call was placed at 11:02.  When notified of plan, Ms Donzetta MattersGalloway immediately refuses to come to the ED to pick pt, then hangs up the phone.  This was staffed with Dr Jannifer FranklinAkintayo.  Pt will be provided with transportation to return to the household where he lives with his mother.  Pt's nurse, Glen Johnson, has been notified.  Glen Canninghomas Sayge Salvato, MA Triage Specialist [phone]

## 2015-08-27 NOTE — Progress Notes (Signed)
CSW familiar with pt from previous visits.  Pt's mother cannot care for pt any longer due to his MH issues, as well as his TBI.  Pt's mother is refusing to allow pt to return to her home, where she also cares for pt's daughters.  CSW received call from pt's therapist, Kate SableSara Mattson, [employer] [phone]) who strongly supports placement for pt, as his mother is not physically/emotionally able to manage pt at home anymore.  She is concerned re: Mrs. Vashti HeyGalloway's safety if pt returns home, and she has encouraged her to relinquish her guardianship of Feliz Beamravis so he can get the services (placement) he needs from [facility].  CPS has been contacted re: this situation, but would not take report because they thought "mom was placing pt in a facility" following the closure of their last open case with Feliz Beamravis.  CSW will call CPS again to update them on pt's current situation.  CSW has also called [facility] and referred pt for expedited Care Coordination for placement in a [facility].  CSW will continue to follow for disposition.  Illene SilverJody Charisse Wendell, LCSW [facility]  Evening/ED Coverage [phone]

## 2015-08-27 NOTE — Discharge Instructions (Signed)
For your ongoing mental health needs, you are advised to follow up with [facility].  If you do not currently have an appointment, new and returning patients are seen at their walk-in clinic.  Walk-in hours are Monday - Friday from 8:00 am - 3:00 pm.  Walk-in patients are seen on a first come, first served basis.  Try to arrive as early as possible for he best chance of being seen the same day: ° °     [facility] °     [address] °     [place], [place] [address] °     [phone] °

## 2015-08-27 NOTE — ED Notes (Signed)
Attempted to call report to [facility]. Was informed that [facility] could not accept the patient due to a TBI. ED charge nurse notified.

## 2015-08-27 NOTE — Progress Notes (Addendum)
CSW spoke with Asst. Social Work Interior and spatial designerDirector to obtain a [employer] engine[employer] voucher for patient and cab voucher was approved.   CSW spoke with Fredrich RomansGloria Iorio [phone], who is patient's Legal Guardian. CSW informed her that patient had been psychiatrically cleared and he would be sent home by taxi. She stated "I am at my doctors office do not send him to 1526". She stated he needs to be sent to "mental health, group home". She then hung up the phone on CSW.  CSW staffed with NP who states to make an APS report and to call and inform the mother, as she refuses to pick up patient. Attempted telephone call to the mother. The message stated customer was unavailable at the time.   CSW spoke with patient's mother to inform her that an APS report would be made due to neglect. She continues to refuse to pick up patient. She stated " I mean you better not bring him to 1526 and I don't care if you call. I am one step ahead of you and I have already called Social Services". Patient's mother hung up the phone on CSW.  CSW called to make APS report. Message left with name and contact number for return call.   Elenore PaddyLaVonia Lela Murfin, LCSWA [phone] ED CSW 08/27/2015 11:27 AM

## 2015-08-27 NOTE — ED Notes (Signed)
Pt will be evaluated by psychiatry this am

## 2015-08-27 NOTE — Progress Notes (Signed)
D Pt. Denies SI and HI, no complaints of pain or discomfort noted at present time.  A Writer offered support and encouragement, discussed reason for pt.'s admission.  R Pt. States he refused to take his noon medication that included 1 pill, then he walked to his cousins house for a clean pair of underwear because his were torn and his Mother called the police on him.   Pt. States he does not really know why she called the police or why he is here.

## 2015-08-27 NOTE — ED Notes (Signed)
Pt admitted to room #39. Pt behavior calm and cooperative, pleasant on approach. Reports he is at the hospital d/t "My mom" Pt reports "the police picked me up when I was walking to my cousins house."Pt denies SI/HI.  laughing at times. Special checks q  15 mins in place for safety. Video monitoring in place.

## 2015-08-28 NOTE — ED Notes (Signed)
Up to the bathroom 

## 2015-08-28 NOTE — ED Notes (Signed)
Dr Jeraldine Lootslockwood aware

## 2015-08-28 NOTE — Progress Notes (Addendum)
Patient's case discussed in the Quality Collaborative meeting. APS report completed with Jennette Kettleendra Marcus, [place] Social Work[place], [place].  Call received from Jennette Kettleendra Marcus, APS Intake who states patient's report was substantiated and the APS Social Worker for the case is Fara ChuteCarmen Charleton [phone].  Asst. Social Work Interior and spatial designerDirector stated he spoke with the supervisor at [facility] to see if the mother could come and pick up patient to prevent an extended stay for patient in the ED. Informed him case was substantiated.    Elenore PaddyLaVonia Ayvah Caroll, LCSWA [phone] ED CSW 11:55 AM 08/28/2015

## 2015-08-29 NOTE — Progress Notes (Signed)
ED CM spoke with ED SW ED CM spoke with [employer] about contact to family

## 2015-08-29 NOTE — ED Notes (Signed)
Patient noted in room. No complaints, stable, in no acute distress. Q15 minute rounds and monitoring via security cameras continue for safety. 

## 2015-08-29 NOTE — Progress Notes (Addendum)
Pt awake in bed and calm at present. Presents with congruent affect and pleasant mood. Denies SI, HI, AVH and pain when assessed. Scheduled and PRN medications administered as prescribed. Pt received PRN Klonopin 0.5 mg for increased anxiety aeb pacing, refusing to take his medications, demanding to talk to his mother (loud, pressured speech at the time), frustrated when he did not get her on the phone. Emotional support and availability provided to pt. Writer encouraged pt to voice needs and to attend to his ADLs but without avail. Safety maintained throughout this shift, Q 15 minutes checks remains effective.

## 2015-08-29 NOTE — Progress Notes (Signed)
Spoke with [employer] who stated that mom continues to refuse to pick pt up from the ED.  CSW also spoke with mom who went to courthouse today (per the advice of CPS) to relinquish her guardianship of pt.  Per mom, she has not [facility] heard [facility] re: pt's assignment of a Care Coordinator.  CSW called [facility] and was given the voice mail of "Rhett BannisterKaren McClelland" whom [facility] identified as pt's assigned Care Coordinator.  VM message left, but Customer Service refused to provide CSW with Ms. McClelland's direct number.  CSW will continue to follow and assist with disposition.  Illene SilverJody Myisha Pickerel, LCSW [facility] ED/Evening Coverage [phone]

## 2015-08-29 NOTE — Progress Notes (Signed)
Staffed with Asst. Social Work Interior and spatial designerDirector. Informed to speak with APS Social Work and call [employer]Emer[employer] Police for them to go to the home and inform the mother that she needs to pick patient up from the hospital.   Glen Chutearmen Charleton, [facility], APS Social Worker made a visit to speak with CSW. She stated they cannot force the mother to pick up patient. She stated [facility] stated the patient does not qualify for state funds. She stated [facility] referred the mother to [facility]. She stated the mother cannot drive due to her own medical issues. She stated the mother mentioned that she was planning to go to the courthouse to have the guardianship modified to make patient ward of the state.  ED CM assisted CSW to contact the police to go by mother's home.  Glen Johnson, LCSWA [phone] ED CSW 08/29/2015 4:16 PM

## 2015-08-30 NOTE — ED Notes (Addendum)
Wrong chart

## 2015-08-30 NOTE — ED Notes (Signed)
Patient has been cooperative with staff today.  He is taking his medications and his appetite is good.  He has been watching television and resting throughout the day.  He was asked to take a shower and told me that he did, but I did not see him go into the shower, nor did any of the other staff on duty today.

## 2015-08-31 NOTE — ED Notes (Signed)
Glen Johnson has been in his room much of the day.  He has come out multiple times to try and reach his mother on the phone.  He was finally able to speak with her.  He has asked multiple times when he is leaving and was upset stating "you might as well put me in a group home."  He was able to redirected without difficulty.  He denies SI/HI or A/V hallucinations.  He took his medications without difficulty.  He has accepted meals and snacks.  He denies any pain or discomfort and appears to be in no apparent distress.  Q 15 minute checks maintained for safety.
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
## 2015-08-31 NOTE — ED Notes (Signed)
Patient noted in room. No complaints, stable, in no acute distress. Q15 minute rounds and monitoring via security cameras continue for safety. 

## 2015-09-01 NOTE — BHH Counselor (Signed)
Per Dr. Jannifer FranklinAkintayo and Nanine MeansJamison Lord, DNP patient meets criteria [facility] IN[facility]. Patient assigned to  508-1; after 6pm. Guardian Fredrich Romans(Gloria Degraaf) [phone] contacted 09/01/2015 @ 1739 and made aware of patient's disposition. [facility].

## 2015-09-02 NOTE — ED Notes (Signed)
Patient denies SI, HI and AVH at this time. Patient in dayroom interacting appropriate with his peers at this time. Encouragement and support provided and safety maintain. Q 15 min safety checks remain in place.

## 2015-09-02 NOTE — Progress Notes (Signed)
CSW met with Glen Johnson, Owner of [facility]. He came to assess patient to see if he would be appropriate for his group home. He stated he wanted to obtain more information from patient's mother and the Care Coordinator, as he states he was unable to obtain much information from patient. He stated he would contact CSW in the AM and inform whether he will accept patient. Asst. Social Work Mudlogger updated via Advertising account executive.  Genice Rouge [phone] ED CSW 09/02/2015 3:07 PM

## 2015-09-02 NOTE — Progress Notes (Signed)
CSW completed PASRR form for pt. Currently, it is pending and the screen is still running.  CSW ill continue to follow up.  Trish MageBrittney Kervin Bones, LCSWA [phone] ED CSW 09/02/2015 10:06 PM

## 2015-09-02 NOTE — ED Notes (Signed)
Pt is calm and cooperative. He interacts with other patients appropriately. He is childlike. He ambulates without difficulty and is able to perform ADLs. He requires prompting to take a shower. He takes his medications without difficulty. He knows where he is, "I'm in the hospital, again", but lacks insight as to why he is here.

## 2015-09-02 NOTE — ED Notes (Signed)
Pt awake, alert & responsive, no distress noted, calm & cooperative at present.  Watching TV in dayroom at present.  Monitoring for safety, Q 15 min checks in effect.

## 2015-09-02 NOTE — Progress Notes (Addendum)
[facility] to [facility] [phone] [name] or [name] [phone]. Unable to leave messages on either line.  Message sent to Jinny SandersBetty Davis, [facility] regarding placement. Waiting for response.  Message left for Care Coordinator, Janit BernKaren McCleland with [facility] for return call.  Spoke with Gladis RiffleQuentin Pulliam, Owner of [facility] [phone] regarding placement. He asked for clinical assessment and FL2 to be faxed. Fax# [phone]. Information faxed for review.  Message left for Amerisource[employer]erg[employer] CorporationDerick McDowell Owner of [facility]. Waiting for return call.   Call received from [facility], information faxed for review. Fax# [phone]  Call received from [facility] who stated he would be able to come and complete an assessment on patient today at 2:00pm.   Asst. Social Work Interior and spatial designerDirector updated on recent contacts.  Spoke with Oralia RudWilliam Craig, Residential Director with [facility] [phone]. Stated he would speak with other staff first and call back.    Elenore PaddyLaVonia Anwitha Mapes, LCSWA [phone] ED CSW 09/02/2015 8:27 AM

## 2015-09-02 NOTE — Progress Notes (Signed)
CSW spoke with Rhett BannisterKaren McClelland, Care Coordinator with [facility] this AM. She stated patient is [facility] [phone]. She stated she will contact this agency today. She asked if patient's FL2 could be faxed. FL2 faxed to [phone]. She stated she would text information for two facilities for possible placement.   Elenore PaddyLaVonia Davarion Cuffee, LCSWA [phone] ED CSW 09/02/2015 3:14 PM

## 2015-09-02 NOTE — [place] FL2 (Signed)
[place] MEDICAID FL2 LEVEL OF CARE SCREENING TOOL     IDENTIFICATION  Patient Name: Glen Johnson Birthdate: 07/14/1975 Sex: male Admission Date (Current Location): 08/26/2015  [facility] and [place] Number:  Producer, television/film/video and Address:  [facility],  [address], [place] [address]      Provider Number: [id number]  Attending Physician Name and Address:  Provider Default, MD  Relative Name and Phone Number:       Current Level of Care: Hospital Recommended Level of Care: Family Care Home, Other (Comment) (Group Home) Prior Approval Number:    Date Approved/Denied:   PASRR Number:    Discharge Plan: Other (Comment) (Group Home, Family Care Home)    Current Diagnoses: Patient Active Problem List   Diagnosis Date Noted  . Schizophrenia (HCC)   . Seizures (HCC) 02/18/2015  . Abnormality of gait 02/17/2015  . Schizophrenia, unspecified type (HCC)   . Involuntary commitment   . Violent behavior   . 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    Orientation RESPIRATION BLADDER Height & Weight     Place  Normal Continent Weight:   Height:     BEHAVIORAL SYMPTOMS/MOOD NEUROLOGICAL BOWEL NUTRITION STATUS    Convulsions/Seizures (Hx of seizures per MD note) Continent Diet  AMBULATORY STATUS COMMUNICATION OF NEEDS Skin   Independent Verbally Normal                       Personal Care Assistance Level of Assistance  Bathing, Feeding, Dressing Bathing Assistance: Independent Feeding assistance: Independent Dressing Assistance: Independent     Functional Limitations Info  Sight, Hearing, Speech Sight Info: Adequate Hearing Info: Adequate Speech Info: Adequate    SPECIAL CARE FACTORS FREQUENCY                       Contractures      Additional Factors Info  Code Status, Allergies Code Status Info:  (Full code) Allergies Info:  (No Known Allergies)           Current Medications  (09/02/2015):  This is the current hospital active medication list Current Facility-Administered Medications  Medication Dose Route Frequency Provider Last Rate Last Dose  . acetaminophen (TYLENOL) tablet 650 mg  650 mg Oral Q4H PRN Donnetta Hutching, MD      . alum & mag hydroxide-simeth (MAALOX/MYLANTA) 200-200-20 MG/5ML suspension 30 mL  30 mL Oral PRN Donnetta Hutching, MD      . amLODipine (NORVASC) tablet 10 mg  10 mg Oral Daily Charm Rings, NP   10 mg at 09/01/15 1000  . busPIRone (BUSPAR) tablet 5 mg  5 mg Oral TID Charm Rings, NP   5 mg at 09/01/15 2112  . clonazePAM (KLONOPIN) tablet 0.5 mg  0.5 mg Oral BID PRN Charm Rings, NP   0.5 mg at 08/29/15 1044  . divalproex (DEPAKOTE ER) 24 hr tablet 500 mg  500 mg Oral BID Charm Rings, NP   500 mg at 09/01/15 2111  . glycopyrrolate (ROBINUL) tablet 2 mg  2 mg Oral TID Charm Rings, NP   2 mg at 09/01/15 2111  . ibuprofen (ADVIL,MOTRIN) tablet 600 mg  600 mg Oral Q8H PRN Donnetta Hutching, MD      . levETIRAcetam (KEPPRA) tablet 500 mg  500 mg Oral BID Charm Rings, NP   500 mg at 09/01/15 2111  . lurasidone (LATUDA) tablet 60 mg  60 mg Oral QHS Charm RingsJamison Y Lord, NP   60 mg at 09/01/15 2111  . metoprolol tartrate (LOPRESSOR) tablet 25 mg  25 mg Oral BID Charm RingsJamison Y Lord, NP   25 mg at 09/01/15 2111  . mirtazapine (REMERON) tablet 15 mg  15 mg Oral QHS Charm RingsJamison Y Lord, NP   15 mg at 09/01/15 2111  . ondansetron (ZOFRAN) tablet 4 mg  4 mg Oral Q8H PRN Donnetta HutchingBrian Cook, MD      . QUEtiapine (SEROQUEL) tablet 50 mg  50 mg Oral BID Charm RingsJamison Y Lord, NP   50 mg at 09/01/15 2111  . zolpidem (AMBIEN) tablet 5 mg  5 mg Oral QHS PRN Donnetta HutchingBrian Cook, MD       Current Outpatient Prescriptions  Medication Sig Dispense Refill  . amLODipine (NORVASC) 10 MG tablet Take 1 tablet (10 mg total) by mouth daily. 30 tablet 0  . busPIRone (BUSPAR) 5 MG tablet Take 1 tablet (5 mg total) by mouth 3 (three) times daily. 90 tablet 0  . clonazePAM (KLONOPIN) 0.5 MG tablet Take 1 tablet (0.5  mg total) by mouth 2 (two) times daily as needed (anxiety). 30 tablet 0  . glycopyrrolate (ROBINUL) 2 MG tablet Take 2 mg by mouth 3 (three) times daily.    Marland Kitchen. levETIRAcetam (KEPPRA) 500 MG tablet Take 1 tablet (500 mg total) by mouth 2 (two) times daily. 60 tablet 0  . lurasidone 60 MG TABS Take 60 mg by mouth at bedtime. 30 tablet 0  . metoprolol tartrate (LOPRESSOR) 25 MG tablet Take 1 tablet (25 mg total) by mouth 2 (two) times daily.  0  . mirtazapine (REMERON) 15 MG tablet Take 1 tablet (15 mg total) by mouth at bedtime. 30 tablet 0  . QUEtiapine (SEROQUEL) 50 MG tablet Take 1 tablet (50 mg total) by mouth 2 (two) times daily. 60 tablet 0  . divalproex (DEPAKOTE ER) 500 MG 24 hr tablet Take 1 tablet (500 mg total) by mouth 2 (two) times daily. 60 tablet 0     Discharge Medications: Please see discharge summary for a list of discharge medications.  Relevant Imaging Results:  Relevant Lab Results:   Additional Information    Claudean SeveranceLaVonia M Shereese Bonnie, LCSW

## 2015-09-03 NOTE — ED Notes (Signed)
Patient denies SI, HI and AVH at this time. Patient is calm and cooperative. Patient in dayroom interacting appropriate with his peers. Encouragement and support provided and safety maintain. Q 15 min safety checks remain in place.

## 2015-09-03 NOTE — Progress Notes (Signed)
Called received from Oralia RudWilliam Craig, [facility]. He asked for patient's information to be faxed. Faxed information to [phone]. Waiting for response from facility.  Elenore PaddyLaVonia Marites Nath, LCSWA [phone] ED CSW 09/03/2015 12:17 PM

## 2015-09-03 NOTE — Progress Notes (Signed)
Spoke with Gladis RiffleQuentin Pulliam, Owner of [facility] [phone] regarding placement. He stated he wanted to gather more information from the Care Coordinator before making a decision. He stated he would call back in the morning.   Elenore PaddyLaVonia Hetty Linhart, LCSWA [phone] ED CSW 09/03/2015 2:28 PM

## 2015-09-03 NOTE — ED Notes (Signed)
Pt behavior calm and cooperative, child like at times.  Pleasant on approach. Compliant with medication regimen. Pt denies SI/HI. Denies AVH. Special checks q 15 mins in place for safety. Video monitoring in place.

## 2015-09-04 MED ORDER — DIVALPROEX SODIUM ER 500 MG PO TB24: 500.0000 mg | ORAL_TABLET | Freq: Two times a day (BID) | ORAL | Status: DC

## 2015-09-04 MED ORDER — GLYCOPYRROLATE 2 MG PO TABS: 2.0000 mg | ORAL_TABLET | Freq: Three times a day (TID) | ORAL | Status: AC

## 2015-09-04 MED ORDER — LEVETIRACETAM 500 MG PO TABS: 500.0000 mg | ORAL_TABLET | Freq: Two times a day (BID) | ORAL | Status: DC

## 2015-09-04 MED ORDER — BUSPIRONE HCL 5 MG PO TABS: 5.0000 mg | ORAL_TABLET | Freq: Three times a day (TID) | ORAL | Status: DC

## 2015-09-04 MED ORDER — METOPROLOL TARTRATE 25 MG PO TABS: 25.0000 mg | ORAL_TABLET | Freq: Two times a day (BID) | ORAL | Status: DC

## 2015-09-04 MED ORDER — LURASIDONE HCL 60 MG PO TABS: 60.0000 mg | ORAL_TABLET | Freq: Every day | ORAL | Status: DC

## 2015-09-04 MED ORDER — MIRTAZAPINE 15 MG PO TABS: 15.0000 mg | ORAL_TABLET | Freq: Every day | ORAL | Status: AC

## 2015-09-04 MED ORDER — TUBERCULIN PPD 5 UNIT/0.1ML ID SOLN
5.0000 [IU] | Freq: Once | INTRADERMAL | Status: DC
  Administered 2015-09-04: 5 [IU] via INTRADERMAL
  Filled 2015-09-04: qty 0.1

## 2015-09-04 MED ORDER — CLONAZEPAM 0.5 MG PO TABS: 0.5000 mg | ORAL_TABLET | Freq: Two times a day (BID) | ORAL | Status: DC | PRN

## 2015-09-04 MED ORDER — QUETIAPINE FUMARATE 50 MG PO TABS
50.0000 mg | ORAL_TABLET | Freq: Two times a day (BID) | ORAL | Status: DC
Start: 2015-09-04 — End: 2015-11-10

## 2015-09-04 MED ORDER — AMLODIPINE BESYLATE 10 MG PO TABS: 10.0000 mg | ORAL_TABLET | Freq: Every day | ORAL | Status: AC
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
## 2015-09-04 NOTE — ED Provider Notes (Signed)
12:50- I was approached by social work, who state that the patient is ready to be discharged, to a family care home. She asked that I sign the fall, to which I agreed to do. Apparently, he will leave either today or tomorrow. Review of records indicate that he is currently stable, and appears to be able to be managed as an outpatient.  Mancel BaleElliott Lihanna Biever, MD 09/04/15 [phone]

## 2015-09-04 NOTE — BH Assessment (Signed)
[facility] must requested additional information for PASRR. CSW faxed the requested information.  Trish MageBrittney Damya Johnson, LCSWA [phone] ED CSW 09/04/2015 10:58 PM

## 2015-09-04 NOTE — Progress Notes (Addendum)
Attempted call to Gladis RiffleQuentin Pulliam with [facility]. Left message for return call.  Spoke with Oralia RudWilliam Craig with [facility]. He stated he would now need to speak with his administrative team to see if patient would be appropriate for their facility. Stated he would call back today.  Message received from Asst. Social Work Interior and spatial designerDirector stating patient had been accepted [facility]. He noted TB skin test, FL2, and prescriptions would need to be completed. Spoke with NP and she stated she would put in the order for the TB skin test.   Spoke with Mrs. [facility]ith CenterLiggins with [facility]. She stated she would pick up patient tomorrow around 12:00 or 1:00pm. She stated one of her staff members Darryl was coming to visit to patient on today. She did not provide a certain time. She stated what she would need on tomorrow when picking up patient: 1. Medication list signed by the doctor 2. TB skin test 3. FL2 4. Prescriptions  Elenore PaddyLaVonia Keyerra Lamere, LCSWA [phone] ED CSW 8:24 AM 09/04/2015

## 2015-09-04 NOTE — Progress Notes (Signed)
Guardian/Mom is aware of [facility]. CSW provided her with contact information.  CSW spoke with owner of facility/Ms.Kennith CenterLiggins who states that she will be here tomorrow at 3pm to pick up patient. Mom is aware.  Trish MageBrittney Dovey Fatzinger, LCSWA [phone] ED CSW 09/04/2015 8:45 PM

## 2015-09-05 MED ORDER — METOPROLOL TARTRATE 25 MG PO TABS: 25.0000 mg | ORAL_TABLET | Freq: Two times a day (BID) | ORAL | Status: DC

## 2015-09-05 NOTE — Progress Notes (Addendum)
CSW refaxed information to [place] [facility] to obtain PASRR. Message stated not received. Fax# [phone]  Elenore PaddyLaVonia Loriel Diehl, LCSWA [phone] ED CSW 09/05/2015 10:17 AM

## 2015-09-05 NOTE — Progress Notes (Addendum)
Pt alert and oriented to self. Denies SI, HI, AVh and pain when assessed. Presents with bright affect on contact. Expressed being excited about d/c to group home today. However, pt is still waiting to be picked up by group home presentative. Pt remains compliant with medications when offered. Verbal encouragement and emotional support provided to pt. Vitals stable without distress. Q 15 minutes checks maintained for safety without incident at this time.

## 2015-09-05 NOTE — ED Notes (Signed)
Patient denies SI, HI and AVH at this time. Patient reports that he is happy that he is going to group home tomorrow. Plan of care discussed with patient. Patient voices no complaints or concerns. Encouragement and support provided and safety maintain. Q 15 min safety checks remain in place.

## 2015-09-05 NOTE — ED Notes (Signed)
Pt off unit awaiting discharge.

## 2015-09-09 NOTE — Progress Notes (Signed)
CSW spoke with Elwyn ReachStephanie Justice of [facility] regarding PASRR for patient. She states that due to patient paying privately for group home placement and not having medicaid a PASRR number is not needed.  CSW reached out to group home owner/Ms.Kennith CenterLiggins and informed her that a PASRR was not needed at this time due to the patient paying privately. Owner states that pt will be using his check to cover fee of group home at this time. She states that she will be assisting the pt with applying for Medicaid.  Trish MageBrittney Lucylle Foulkes, LCSWA [phone] ED CSW 09/09/2015 7:44 PM

## 2015-09-13 ENCOUNTER — Observation Stay (HOSPITAL_COMMUNITY)
Admission: EM | Admit: 2015-09-13 | Discharge: 2015-09-25 | Disposition: A | Discharge status: Home / Self Care | Payer: Medicaid Other | Attending: Internal Medicine | Admitting: Internal Medicine

## 2015-09-13 ENCOUNTER — Emergency Department (HOSPITAL_COMMUNITY): Payer: Medicaid Other

## 2015-09-13 ENCOUNTER — Encounter (HOSPITAL_COMMUNITY): Payer: Self-pay

## 2015-09-13 DIAGNOSIS — S80211A Abrasion, right knee, initial encounter: Secondary | ICD-10-CM | POA: Diagnosis not present

## 2015-09-13 DIAGNOSIS — F209 Schizophrenia, unspecified: Secondary | ICD-10-CM | POA: Insufficient documentation

## 2015-09-13 DIAGNOSIS — N183 Chronic kidney disease, stage 3 (moderate): Secondary | ICD-10-CM | POA: Insufficient documentation

## 2015-09-13 DIAGNOSIS — E872 Acidosis, unspecified: Secondary | ICD-10-CM

## 2015-09-13 DIAGNOSIS — Z79899 Other long term (current) drug therapy: Secondary | ICD-10-CM | POA: Insufficient documentation

## 2015-09-13 DIAGNOSIS — G40909 Epilepsy, unspecified, not intractable, without status epilepticus: Secondary | ICD-10-CM | POA: Insufficient documentation

## 2015-09-13 DIAGNOSIS — E860 Dehydration: Secondary | ICD-10-CM | POA: Diagnosis present

## 2015-09-13 DIAGNOSIS — E875 Hyperkalemia: Secondary | ICD-10-CM | POA: Insufficient documentation

## 2015-09-13 DIAGNOSIS — F4325 Adjustment disorder with mixed disturbance of emotions and conduct: Principal | ICD-10-CM | POA: Diagnosis present

## 2015-09-13 DIAGNOSIS — W19XXXA Unspecified fall, initial encounter: Secondary | ICD-10-CM | POA: Diagnosis not present

## 2015-09-13 DIAGNOSIS — Z87820 Personal history of traumatic brain injury: Secondary | ICD-10-CM | POA: Diagnosis not present

## 2015-09-13 DIAGNOSIS — N179 Acute kidney failure, unspecified: Secondary | ICD-10-CM

## 2015-09-13 DIAGNOSIS — F319 Bipolar disorder, unspecified: Secondary | ICD-10-CM | POA: Insufficient documentation

## 2015-09-13 DIAGNOSIS — Z87891 Personal history of nicotine dependence: Secondary | ICD-10-CM | POA: Diagnosis not present

## 2015-09-13 DIAGNOSIS — S80212A Abrasion, left knee, initial encounter: Secondary | ICD-10-CM | POA: Insufficient documentation

## 2015-09-13 DIAGNOSIS — R569 Unspecified convulsions: Secondary | ICD-10-CM

## 2015-09-13 DIAGNOSIS — I129 Hypertensive chronic kidney disease with stage 1 through stage 4 chronic kidney disease, or unspecified chronic kidney disease: Secondary | ICD-10-CM | POA: Insufficient documentation

## 2015-09-13 DIAGNOSIS — R4182 Altered mental status, unspecified: Secondary | ICD-10-CM | POA: Diagnosis present

## 2015-09-13 DIAGNOSIS — R55 Syncope and collapse: Secondary | ICD-10-CM | POA: Diagnosis not present

## 2015-09-13 LAB — BASIC METABOLIC PANEL
ANION GAP: 12 (ref 5–15)
BUN: 19 mg/dL (ref 6–20)
CHLORIDE: 105 mmol/L (ref 101–111)
CO2: 19 mmol/L — ABNORMAL LOW (ref 22–32)
Calcium: 9.7 mg/dL (ref 8.9–10.3)
Creatinine, Ser: 2.51 mg/dL — ABNORMAL HIGH (ref 0.61–1.24)
GFR calc Af Amer: 35 mL/min — ABNORMAL LOW (ref >60)
GFR, EST NON AFRICAN AMERICAN: 30 mL/min — AB (ref >60)
Glucose, Bld: 94 mg/dL (ref 65–99)
POTASSIUM: 5.3 mmol/L — AB (ref 3.5–5.1)
Sodium: 136 mmol/L (ref 135–145)

## 2015-09-13 LAB — CBC WITH DIFFERENTIAL/PLATELET
BASOS ABS: 0 10*3/uL (ref 0.0–0.1)
Basophils Relative: 0 %
EOS PCT: 1 %
Eosinophils Absolute: 0.1 10*3/uL (ref 0.0–0.7)
HCT: 43.9 % (ref 39.0–52.0)
HEMOGLOBIN: 14.8 g/dL (ref 13.0–17.0)
LYMPHS ABS: 1.1 10*3/uL (ref 0.7–4.0)
LYMPHS PCT: 18 %
MCH: 30.8 pg (ref 26.0–34.0)
MCHC: 33.7 g/dL (ref 30.0–36.0)
MCV: 91.5 fL (ref 78.0–100.0)
Monocytes Absolute: 0.3 10*3/uL (ref 0.1–1.0)
Monocytes Relative: 6 %
NEUTROS PCT: 75 %
Neutro Abs: 4.3 10*3/uL (ref 1.7–7.7)
PLATELETS: 199 10*3/uL (ref 150–400)
RBC: 4.8 MIL/uL (ref 4.22–5.81)
RDW: 13.2 % (ref 11.5–15.5)
WBC: 5.8 10*3/uL (ref 4.0–10.5)

## 2015-09-13 LAB — I-STAT TROPONIN, ED: TROPONIN I, POC: 0.11 ng/mL — AB (ref 0.00–0.08)

## 2015-09-13 LAB — I-STAT CG4 LACTIC ACID, ED: Lactic Acid, Venous: 4.43 mmol/L (ref 0.5–1.9)

## 2015-09-13 MED ORDER — SODIUM CHLORIDE 0.9 % IV BOLUS (SEPSIS)
1000.0000 mL | Freq: Once | INTRAVENOUS | Status: AC
  Administered 2015-09-13: 1000 mL via INTRAVENOUS

## 2015-09-13 MED ORDER — BACITRACIN ZINC 500 UNIT/GM EX OINT
1.0000 "application " | TOPICAL_OINTMENT | Freq: Two times a day (BID) | CUTANEOUS | Status: DC
  Administered 2015-09-13 – 2015-09-24 (×20): 1 via TOPICAL
  Filled 2015-09-13 (×23): qty 28.35

## 2015-09-13 MED ORDER — ASPIRIN 81 MG PO CHEW
324.0000 mg | CHEWABLE_TABLET | Freq: Once | ORAL | Status: AC
  Administered 2015-09-13: 324 mg via ORAL
  Filled 2015-09-13: qty 4

## 2015-09-13 NOTE — ED Notes (Signed)
Per [facility], pt [facility] up [place] [facility]. Was staying with mom and she kicked him out and told him to go to the group home. [facility] EMS [facility]. Fell on concrete earlier tonight and has bilateral abrasions to both knees. Keeps complaining that he is thirsty. EMS reports he was very diaphoretic

## 2015-09-13 NOTE — ED Notes (Signed)
Marcelino DusterMichelle from Group home [phone]

## 2015-09-13 NOTE — ED Provider Notes (Signed)
CSN: [id number]     Arrival date & time 09/13/15  2217 History   First MD Initiated Contact with Patient 09/13/15 2221     Chief Complaint  Patient presents with  . Altered Mental Status  . Dehydration     (Consider location/radiation/quality/duration/timing/severity/associated sxs/prior Treatment) HPI Comments: Patient with PMH of schizophrenia, TBI, seizures, bipolar presents to the ED with a chief complaint of syncope.  Patient reportedly found wandering the streets.  Noted to be diaphoretic.  States that he passed out earlier and hit his left knee.  Denies any other symptoms.    LEVEL 5 CAVEAT APPLIES 2/2 mental status/pscyhiatric disorder.  The history is provided by the patient. No language interpreter was used.    Past Medical History  Diagnosis Date  . Seizures (HCC)   . Hypertension   . TBI (traumatic brain injury) (HCC)   . Bipolar disorder (HCC)   . Schizophrenia (HCC)   . Renal disorder     chronic kidney disease, stage II  . Depression    Past Surgical History  Procedure Laterality Date  . Tracheostomy    . Tracheostomy closure     Family History  Problem Relation Age of Onset  . Seizures Mother   . Migraines Mother   . Arthritis Mother    Social History  Substance Use Topics  . Smoking status: Former Games developermoker  . Smokeless tobacco: Not on file  . Alcohol Use: No     Comment: Stopped one year ago.    Review of Systems  Unable to perform ROS: Mental status change      Allergies  Review of patient's allergies indicates no known allergies.  Home Medications   Prior to Admission medications   Medication Sig Start Date End Date Taking? Authorizing Provider  amLODipine (NORVASC) 10 MG tablet Take 1 tablet (10 mg total) by mouth daily. 09/04/15   Charm RingsJamison Y Lord, NP  busPIRone (BUSPAR) 5 MG tablet Take 1 tablet (5 mg total) by mouth 3 (three) times daily. 09/04/15   Charm RingsJamison Y Lord, NP  clonazePAM (KLONOPIN) 0.5 MG tablet Take 1 tablet (0.5 mg total) by  mouth 2 (two) times daily as needed (anxiety). 09/04/15   Charm RingsJamison Y Lord, NP  divalproex (DEPAKOTE ER) 500 MG 24 hr tablet Take 1 tablet (500 mg total) by mouth 2 (two) times daily. 09/04/15   Charm RingsJamison Y Lord, NP  glycopyrrolate (ROBINUL) 2 MG tablet Take 1 tablet (2 mg total) by mouth 3 (three) times daily. 09/04/15   Charm RingsJamison Y Lord, NP  levETIRAcetam (KEPPRA) 500 MG tablet Take 1 tablet (500 mg total) by mouth 2 (two) times daily. 09/04/15   Charm RingsJamison Y Lord, NP  Lurasidone HCl 60 MG TABS Take 60 mg by mouth at bedtime. 09/04/15   Charm RingsJamison Y Lord, NP  metoprolol tartrate (LOPRESSOR) 25 MG tablet Take 1 tablet (25 mg total) by mouth 2 (two) times daily. 09/05/15   Benjiman CoreNathan Pickering, MD  mirtazapine (REMERON) 15 MG tablet Take 1 tablet (15 mg total) by mouth at bedtime. 09/04/15   Charm RingsJamison Y Lord, NP  QUEtiapine (SEROQUEL) 50 MG tablet Take 1 tablet (50 mg total) by mouth 2 (two) times daily. 09/04/15   Charm RingsJamison Y Lord, NP   BP 102/60 mmHg  Pulse 104  Temp(Src) 99.1 F (37.3 C) (Oral)  Resp 18  SpO2 96% Physical Exam  Constitutional: He is oriented to person, place, and time. He appears well-developed and well-nourished.  Diaphoretic on initial exam  HENT:  Head:  Normocephalic and atraumatic.  Dry mucous membranes  Eyes: Conjunctivae and EOM are normal. Pupils are equal, round, and reactive to light. Right eye exhibits no discharge. Left eye exhibits no discharge. No scleral icterus.  Neck: Normal range of motion. Neck supple. No JVD present.  No neck stiffness or meningismus  Cardiovascular: Normal rate, regular rhythm and normal heart sounds.  Exam reveals no gallop and no friction rub.   No murmur heard. Pulmonary/Chest: Effort normal and breath sounds normal. No respiratory distress. He has no wheezes. He has no rales. He exhibits no tenderness.  Abdominal: Soft. He exhibits no distension and no mass. There is no tenderness. There is no rebound and no guarding.  No focal abdominal tenderness, no RLQ  tenderness or pain at McBurney's point, no RUQ tenderness or Murphy's sign, no left-sided abdominal tenderness, no fluid wave, or signs of peritonitis   Musculoskeletal: Normal range of motion. He exhibits no edema or tenderness.  Moves all extremities ROM and strength intact throughout  Neurological: He is alert and oriented to person, place, and time.  Responds appropriately to commands A&O x 3  Skin: Skin is warm and dry.  Abrasions to bilateral knees, no lacerations  Psychiatric: He has a normal mood and affect.  Nursing note and vitals reviewed.   ED Course  Procedures (including critical care time) Results for orders placed or performed during the hospital encounter of 09/13/15  CBC with Differential/Platelet  Result Value Ref Range   WBC 5.8 4.0 - 10.5 K/uL   RBC 4.80 4.22 - 5.81 MIL/uL   Hemoglobin 14.8 13.0 - 17.0 g/dL   HCT 16.143.9 09.639.0 - 04.552.0 %   MCV 91.5 78.0 - 100.0 fL   MCH 30.8 26.0 - 34.0 pg   MCHC 33.7 30.0 - 36.0 g/dL   RDW 40.913.2 81.111.5 - 91.415.5 %   Platelets 199 150 - 400 K/uL   Neutrophils Relative % 75 %   Neutro Abs 4.3 1.7 - 7.7 K/uL   Lymphocytes Relative 18 %   Lymphs Abs 1.1 0.7 - 4.0 K/uL   Monocytes Relative 6 %   Monocytes Absolute 0.3 0.1 - 1.0 K/uL   Eosinophils Relative 1 %   Eosinophils Absolute 0.1 0.0 - 0.7 K/uL   Basophils Relative 0 %   Basophils Absolute 0.0 0.0 - 0.1 K/uL  Basic metabolic panel  Result Value Ref Range   Sodium 136 135 - 145 mmol/L   Potassium 5.3 (H) 3.5 - 5.1 mmol/L   Chloride 105 101 - 111 mmol/L   CO2 19 (L) 22 - 32 mmol/L   Glucose, Bld 94 65 - 99 mg/dL   BUN 19 6 - 20 mg/dL   Creatinine, Ser 7.822.51 (H) 0.61 - 1.24 mg/dL   Calcium 9.7 8.9 - 95.610.3 mg/dL   GFR calc non Af Amer 30 (L) >60 mL/min   GFR calc Af Amer 35 (L) >60 mL/min   Anion gap 12 5 - 15  I-stat troponin, ED  Result Value Ref Range   Troponin i, poc 0.11 (HH) 0.00 - 0.08 ng/mL   Comment NOTIFIED PHYSICIAN    Comment 3          I-Stat CG4 Lactic Acid,  ED  Result Value Ref Range   Lactic Acid, Venous 4.43 (HH) 0.5 - 1.9 mmol/L   Comment NOTIFIED PHYSICIAN    Dg Chest 2 View  09/13/2015  CLINICAL DATA:  Larey SeatFell today.  Altered mental status. EXAM: CHEST  2 VIEW COMPARISON:  05/29/2014  FINDINGS: Mild left base opacity may relate to the shallow inspiration. No confluent consolidation. No effusion. Normal pulmonary vasculature. IMPRESSION: Mild left base opacity, accentuated by the shallow inspiration. No confluent consolidation. This may be atelectatic. Electronically Signed   By: Ellery Plunk M.D.   On: 09/13/2015 23:51   Ct Head Wo Contrast  09/13/2015  CLINICAL DATA:  40 year old male with altered mental status EXAM: CT HEAD WITHOUT CONTRAST TECHNIQUE: Contiguous axial images were obtained from the base of the skull through the vertex without intravenous contrast. COMPARISON:  Brain MRI dated 01/23/2015 and head CT dated 12/31/2012 FINDINGS: There is mild cortical atrophy and chronic microvascular ischemic changes advanced for the patient's age. There is no acute intracranial hemorrhage. No mass effect or midline shift noted. There is mild mucoperiosteal thickening of paranasal sinuses. No air-fluid levels. The mastoid air cells are clear. The calvarium is intact. IMPRESSION: No acute intracranial hemorrhage. Minimal diffuse atrophy and chronic microvascular ischemic disease. If symptoms persist and there are no contraindications, MRI may provide better evaluation if clinically indicated Electronically Signed   By: Elgie Collard M.D.   On: 09/13/2015 23:53   Dg Knee Complete 4 Views Left  09/13/2015  CLINICAL DATA:  Fall today.  Anterior left knee abrasions. EXAM: LEFT KNEE - COMPLETE 4+ VIEW COMPARISON:  12/31/2012. FINDINGS: Four views study shows no fracture. No subluxation or dislocation. No evidence for joint effusion. Antegrade tibial IM nail is partially visualized. IMPRESSION: Negative. Electronically Signed   By: Kennith Center M.D.   On:  09/13/2015 23:50    I have personally reviewed and evaluated these images and lab results as part of my medical decision-making.   EKG Interpretation   Date/Time:  Saturday September 13 2015 22:33:36 EDT Ventricular Rate:  98 PR Interval:    QRS Duration: 87 QT Interval:  415 QTC Calculation: 530 R Axis:   61 Text Interpretation:  Sinus rhythm Abnormal R-wave progression, early  transition Borderline repolarization abnormality Prolonged QT interval  Confirmed by HAVILAND MD, JULIE ([id number]) on 09/13/2015 10:37:51 PM      MDM   Final diagnoses:  Dehydration      Patient with confusion/AMS.  Hx of schizophrenia.  Noted to be diaphoretic and very dry mucous membranes.  Rectal temp 99.3.  Lactate is 4.43.  No evidence of infection.  WBC is normal.  CXR shows maybe opacity vs atelectasis, but no cough or fever.  Troponin is bumped to 0.11.  No ischemic changes on EKG, borderline QT prolongation.  Denies any CP or SOB.  Discussed with Dr. Particia Nearing, who recommends admission and continuing fluids.  Suspect dehydration.  Worsening Cr today.  Gradually worsening over the past several months. Will continue fluids.  Drop in BP when going from sitting to standing, but not technically orthostatic.  Patient will need social work consultation in the morning regarding placement.  His group home called to inform that they refuse to take him back.  Appreciate Dr. Julian Reil for admitting the patient.  Roxy Horseman, PA-C 09/14/15 0111  Jacalyn Lefevre, MD 09/14/15 [phone]

## 2015-09-13 NOTE — ED Notes (Signed)
Family at bedside. 

## 2015-09-14 DIAGNOSIS — E860 Dehydration: Secondary | ICD-10-CM | POA: Diagnosis present

## 2015-09-14 DIAGNOSIS — E872 Acidosis, unspecified: Secondary | ICD-10-CM

## 2015-09-14 DIAGNOSIS — N179 Acute kidney failure, unspecified: Secondary | ICD-10-CM

## 2015-09-14 LAB — VALPROIC ACID LEVEL: VALPROIC ACID LVL: 53 ug/mL (ref 50.0–100.0)

## 2015-09-14 LAB — URINALYSIS, ROUTINE W REFLEX MICROSCOPIC
BILIRUBIN URINE: NEGATIVE
Glucose, UA: NEGATIVE mg/dL
HGB URINE DIPSTICK: NEGATIVE
KETONES UR: 15 mg/dL — AB
Leukocytes, UA: NEGATIVE
Nitrite: NEGATIVE
PH: 6.5 (ref 5.0–8.0)
Protein, ur: 100 mg/dL — AB
SPECIFIC GRAVITY, URINE: 1.015 (ref 1.005–1.030)

## 2015-09-14 LAB — URINE MICROSCOPIC-ADD ON

## 2015-09-14 LAB — RAPID URINE DRUG SCREEN, HOSP PERFORMED
Amphetamines: NOT DETECTED
BARBITURATES: NOT DETECTED
Benzodiazepines: NOT DETECTED
Cocaine: NOT DETECTED
Opiates: NOT DETECTED
TETRAHYDROCANNABINOL: NOT DETECTED

## 2015-09-14 LAB — I-STAT CG4 LACTIC ACID, ED: Lactic Acid, Venous: 1 mmol/L (ref 0.5–1.9)

## 2015-09-14 MED ORDER — ENOXAPARIN SODIUM 40 MG/0.4ML ~~LOC~~ SOLN
40.0000 mg | SUBCUTANEOUS | Status: DC
Start: 2015-09-14 — End: 2015-09-25
  Administered 2015-09-20: 40 mg via SUBCUTANEOUS
  Filled 2015-09-14 (×3): qty 0.4

## 2015-09-14 MED ORDER — LURASIDONE HCL 40 MG PO TABS
60.0000 mg | ORAL_TABLET | Freq: Every day | ORAL | Status: DC
  Administered 2015-09-14 – 2015-09-24 (×12): 60 mg via ORAL
  Filled 2015-09-14 (×12): qty 1

## 2015-09-14 MED ORDER — METOPROLOL TARTRATE 25 MG PO TABS
25.0000 mg | ORAL_TABLET | Freq: Two times a day (BID) | ORAL | Status: DC
  Administered 2015-09-14 – 2015-09-25 (×20): 25 mg via ORAL
  Filled 2015-09-14 (×23): qty 1

## 2015-09-14 MED ORDER — SODIUM CHLORIDE 0.9 % IV BOLUS (SEPSIS)
1000.0000 mL | Freq: Once | INTRAVENOUS | Status: AC
  Administered 2015-09-14: 1000 mL via INTRAVENOUS

## 2015-09-14 MED ORDER — LURASIDONE HCL 40 MG PO TABS: 60.0000 mg | ORAL_TABLET | Freq: Every day | ORAL | Status: DC

## 2015-09-14 MED ORDER — MIRTAZAPINE 15 MG PO TABS
15.0000 mg | ORAL_TABLET | Freq: Every day | ORAL | Status: DC
  Administered 2015-09-14 – 2015-09-24 (×12): 15 mg via ORAL
  Filled 2015-09-14 (×12): qty 1

## 2015-09-14 MED ORDER — CLONAZEPAM 0.5 MG PO TABS: 0.5000 mg | ORAL_TABLET | Freq: Two times a day (BID) | ORAL | Status: DC | PRN

## 2015-09-14 MED ORDER — DIVALPROEX SODIUM ER 500 MG PO TB24
500.0000 mg | ORAL_TABLET | Freq: Two times a day (BID) | ORAL | Status: DC
  Administered 2015-09-14 – 2015-09-25 (×23): 500 mg via ORAL
  Filled 2015-09-14 (×23): qty 1

## 2015-09-14 MED ORDER — LURASIDONE HCL 60 MG PO TABS: 60.0000 mg | ORAL_TABLET | Freq: Every day | ORAL | Status: DC

## 2015-09-14 MED ORDER — DIVALPROEX SODIUM ER 500 MG PO TB24: 500.0000 mg | ORAL_TABLET | Freq: Two times a day (BID) | ORAL | Status: DC

## 2015-09-14 MED ORDER — LEVETIRACETAM 500 MG PO TABS: 500.0000 mg | ORAL_TABLET | Freq: Two times a day (BID) | ORAL | Status: DC

## 2015-09-14 MED ORDER — LEVETIRACETAM 500 MG PO TABS
500.0000 mg | ORAL_TABLET | Freq: Two times a day (BID) | ORAL | Status: DC
  Administered 2015-09-14 – 2015-09-25 (×23): 500 mg via ORAL
  Filled 2015-09-14 (×18): qty 1
  Filled 2015-09-14: qty 2
  Filled 2015-09-14 (×4): qty 1

## 2015-09-14 MED ORDER — QUETIAPINE FUMARATE 25 MG PO TABS: 50.0000 mg | ORAL_TABLET | Freq: Two times a day (BID) | ORAL | Status: DC

## 2015-09-14 MED ORDER — GLYCOPYRROLATE 1 MG PO TABS
2.0000 mg | ORAL_TABLET | Freq: Three times a day (TID) | ORAL | Status: DC
  Administered 2015-09-14 – 2015-09-25 (×34): 2 mg via ORAL
  Filled 2015-09-14 (×35): qty 2

## 2015-09-14 MED ORDER — BUSPIRONE HCL 5 MG PO TABS
5.0000 mg | ORAL_TABLET | Freq: Three times a day (TID) | ORAL | Status: DC
  Administered 2015-09-14 – 2015-09-25 (×34): 5 mg via ORAL
  Filled 2015-09-14 (×2): qty 1
  Filled 2015-09-14: qty 0.5
  Filled 2015-09-14 (×32): qty 1

## 2015-09-14 MED ORDER — QUETIAPINE FUMARATE 25 MG PO TABS
50.0000 mg | ORAL_TABLET | Freq: Two times a day (BID) | ORAL | Status: DC
  Administered 2015-09-14 – 2015-09-25 (×23): 50 mg via ORAL
  Filled 2015-09-14 (×27): qty 2

## 2015-09-14 NOTE — Progress Notes (Signed)
Received patient from ED.  [place] [facility] Police called EMS to bring patient to ED, where he received 2.5 1Ltr boluses for dehydration.  From a group home, patient had packed a couple of bags and reportedly was attempting to go see his mom.The group home will not accept him back, and a Child psychotherapistsocial worker consult was ordered.  Patient is developmentally delayed and could not give me information to complete his admission.  He is unsteady on his feet, and is a high fall risk.  He is able to use call bell, and can eat, after set up.  3 side rails are up, and the bed alarm is on. Safety maintained.

## 2015-09-14 NOTE — Progress Notes (Signed)
PROGRESS NOTE    Glen Johnson  ZOX:[id number] DOB: June 16, 1975 DOA: 09/13/2015 PCP: Dorrene German, MD   Brief Narrative:  HPI on 09/14/2015 by Dr. Lyda Perone Glen Johnson is a 40 y.o. male with medical history significant of TBI, seizures, has guardian. Patient presents to the ED via [employer]. He apparently left his group home to go visit his mother (which he isnt allowed to do), mother kicked him out and told him to go back to group home. Unfortunately he didn't make it back, had syncope and collapse he says, [employer] apparently found him wandering around after mother reported him missing when he didn't make it back to the group home. Assessment & Plan   Dehydration/Lactic acidosis -lactic acid 4.43 upon admission -Given IVF -Lactic acid now 1  Acute on chronic kidney disease, stage III -Creatine 2.51 upon admission -Baseline creatinine 1.5-1.7 -likely due to dehydration -Continue to monitor BMP  Seizure disorder -Supposedly found by police wandering around -patient does not remember the events yesterday after leaving his mom -CT head: no acute intracranial hemorrhage  -Continue seizure precautions -Continue keppra, depakote  Mild hyperkalemia -Will give IVF hydration and monitor BMP -Likely complicated by AKI  DVT Prophylaxis  lovenox  Code Status: Full  Family Communication: None at bedside  Disposition Plan: Admitted for observation. Social work consulted as patient's group home is supposedly not accepting him back.   Consultants None  Procedures  None  Antibiotics   Anti-infectives    None      Subjective:   Glen Johnson seen and examined today.  Patient has no complaints this morning.  Cannot remember the events that occurred after he left his mom yesterday. Denies pain, chest pain, shortness of breath, abdominal pain, headache, dizziness.   Objective:   Filed Vitals:   09/14/15 0030 09/14/15 0200 09/14/15 0235 09/14/15 0544  BP:  107/58  110/57 92/60  Pulse: 92  93 69  Temp:   98.1 F (36.7 C) 98.1 F (36.7 C)  TempSrc:   Oral Oral  Resp: Height:   (1.753 m)  (1.753 m)   Weight:  90.8 kg (200 lb 2.8 oz) 90.084 kg (198 lb 9.6 oz)   SpO2: 100%  100% 99%    Intake/Output Summary (Last 24 hours) at 09/14/15 1326 Last data filed at 09/14/15 1304  Gross per 24 hour  Intake    720 ml  Output   1825 ml  Net  -1105 ml   Filed Weights   09/14/15 0200 09/14/15 0235  Weight: 90.8 kg (200 lb 2.8 oz) 90.084 kg (198 lb 9.6 oz)    Exam  General: Well developed, well nourished, NAD  HEENT: NCAT, , mucous membranes moist.   Cardiovascular: S1 S2 auscultated, no murmurs, RRR  Respiratory: Clear to auscultation bilaterally with equal chest rise  Abdomen: Soft, nontender, nondistended, + bowel sounds  Extremities: warm dry without cyanosis clubbing or edema  Neuro: AAOx3, responds slowly, nonfocal  Psych: Normal affect and demeanor    Data Reviewed: I have personally reviewed following labs and imaging studies  CBC:  Recent Labs Lab 09/13/15 2258  WBC 5.8  NEUTROABS 4.3  HGB 14.8  HCT 43.9  MCV 91.5  PLT 199   Basic Metabolic Panel:  Recent Labs Lab 09/13/15 2258  NA 136  K 5.3*  CL 105  CO2 19*  GLUCOSE 94  BUN 19  CREATININE 2.51*  CALCIUM 9.7   GFR: Estimated Creatinine  Clearance: 43.4 mL/min (by C-G formula based on Cr of 2.51). Liver Function Tests: No results for input(s): AST, ALT, ALKPHOS, BILITOT, PROT, ALBUMIN in the last 168 hours. No results for input(s): LIPASE, AMYLASE in the last 168 hours. No results for input(s): AMMONIA in the last 168 hours. Coagulation Profile: No results for input(s): INR, PROTIME in the last 168 hours. Cardiac Enzymes: No results for input(s): CKTOTAL, CKMB, CKMBINDEX, TROPONINI in the last 168 hours. BNP (last 3 results) No results for input(s): PROBNP in the last 8760 hours. HbA1C: No results for input(s): HGBA1C in  the last 72 hours. CBG: No results for input(s): GLUCAP in the last 168 hours. Lipid Profile: No results for input(s): CHOL, HDL, LDLCALC, TRIG, CHOLHDL, LDLDIRECT in the last 72 hours. Thyroid Function Tests: No results for input(s): TSH, T4TOTAL, FREET4, T3FREE, THYROIDAB in the last 72 hours. Anemia Panel: No results for input(s): VITAMINB12, FOLATE, FERRITIN, TIBC, IRON, RETICCTPCT in the last 72 hours. Urine analysis:    Component Value Date/Time   COLORURINE YELLOW 09/13/2015 2330   APPEARANCEUR CLEAR 09/13/2015 2330   LABSPEC 1.015 09/13/2015 2330   PHURINE 6.5 09/13/2015 2330   GLUCOSEU NEGATIVE 09/13/2015 2330   HGBUR NEGATIVE 09/13/2015 2330   BILIRUBINUR NEGATIVE 09/13/2015 2330   KETONESUR 15* 09/13/2015 2330   PROTEINUR 100* 09/13/2015 2330   NITRITE NEGATIVE 09/13/2015 2330   LEUKOCYTESUR NEGATIVE 09/13/2015 2330   Sepsis Labs: @LABRCNTIP (procalcitonin:4,lacticidven:4)  )No results found for this or any previous visit (from the past 240 hour(s)).    Radiology Studies: Dg Chest 2 View  09/13/2015  CLINICAL DATA:  Larey SeatFell today.  Altered mental status. EXAM: CHEST  2 VIEW COMPARISON:  05/29/2014 FINDINGS: Mild left base opacity may relate to the shallow inspiration. No confluent consolidation. No effusion. Normal pulmonary vasculature. IMPRESSION: Mild left base opacity, accentuated by the shallow inspiration. No confluent consolidation. This may be atelectatic. Electronically Signed   By: Ellery Plunkaniel R Mitchell M.D.   On: 09/13/2015 23:51   Ct Head Wo Contrast  09/13/2015  CLINICAL DATA:  40 year old male with altered mental status EXAM: CT HEAD WITHOUT CONTRAST TECHNIQUE: Contiguous axial images were obtained from the base of the skull through the vertex without intravenous contrast. COMPARISON:  Brain MRI dated 01/23/2015 and head CT dated 12/31/2012 FINDINGS: There is mild cortical atrophy and chronic microvascular ischemic changes advanced for the patient's age. There is no  acute intracranial hemorrhage. No mass effect or midline shift noted. There is mild mucoperiosteal thickening of paranasal sinuses. No air-fluid levels. The mastoid air cells are clear. The calvarium is intact. IMPRESSION: No acute intracranial hemorrhage. Minimal diffuse atrophy and chronic microvascular ischemic disease. If symptoms persist and there are no contraindications, MRI may provide better evaluation if clinically indicated Electronically Signed   By: Elgie CollardArash  Radparvar M.D.   On: 09/13/2015 23:53   Dg Knee Complete 4 Views Left  09/13/2015  CLINICAL DATA:  Fall today.  Anterior left knee abrasions. EXAM: LEFT KNEE - COMPLETE 4+ VIEW COMPARISON:  12/31/2012. FINDINGS: Four views study shows no fracture. No subluxation or dislocation. No evidence for joint effusion. Antegrade tibial IM nail is partially visualized. IMPRESSION: Negative. Electronically Signed   By: Kennith CenterEric  Mansell M.D.   On: 09/13/2015 23:50     Scheduled Meds: . bacitracin  1 application Topical BID  . busPIRone  5 mg Oral TID  . divalproex  500 mg Oral BID  . enoxaparin (LOVENOX) injection  40 mg Subcutaneous Q24H  . glycopyrrolate  2 mg  Oral TID  . levETIRAcetam  500 mg Oral BID  . lurasidone  60 mg Oral QHS  . metoprolol tartrate  25 mg Oral BID  . mirtazapine  15 mg Oral QHS  . QUEtiapine  50 mg Oral BID   Continuous Infusions:      Time Spent in minutes   30 minutes  Mekiyah Gladwell D.O. on 09/14/2015 at 1:26 PM  Between 7am to 7pm - Pager - [phone]  After 7pm go to [email or web address] - password [email or web address]  And look for the night coverage person covering for me after hours  [facility] Office  [phone]

## 2015-09-14 NOTE — ED Notes (Signed)
Attempted Report 

## 2015-09-14 NOTE — H&P (Signed)
History and Physical    Sandy Blouch ZOX:[id number] DOB: 03-01-1976 DOA: 09/13/2015   PCP: Dorrene German, MD Chief Complaint:  Chief Complaint  Patient presents with  . Altered Mental Status  . Dehydration    HPI: Dashawn Bartnick is a 40 y.o. male with medical history significant of TBI, seizures, has guardian.  Patient presents to the ED via [employer].  He apparently left his group home to go visit his mother (which he isnt allowed to do), mother kicked him out and told him to go back to group home.  Unfortunately he didn't make it back, had syncope and collapse he says, [employer] apparently found him wandering around after mother reported him missing when he didn't make it back to the group home.  ED Course: Thankfully other than an elevated lactate (likely due to either dehydration or seizure of which he has a history), he appears well at the moment.  Review of Systems: As per HPI otherwise 10 point review of systems negative.    Past Medical History  Diagnosis Date  . Seizures (HCC)   . Hypertension   . TBI (traumatic brain injury) (HCC)   . Bipolar disorder (HCC)   . Schizophrenia (HCC)   . Renal disorder     chronic kidney disease, stage II  . Depression     Past Surgical History  Procedure Laterality Date  . Tracheostomy    . Tracheostomy closure       reports that he has quit smoking. He does not have any smokeless tobacco history on file. He reports that he does not drink alcohol or use illicit drugs.  No Known Allergies  Family History  Problem Relation Age of Onset  . Seizures Mother   . Migraines Mother   . Arthritis Mother      Prior to Admission medications   Medication Sig Start Date End Date Taking? Authorizing Provider  amLODipine (NORVASC) 10 MG tablet Take 1 tablet (10 mg total) by mouth daily. Patient not taking: Reported on 09/13/2015 09/04/15   Charm Rings, NP  busPIRone (BUSPAR) 5 MG tablet Take 1 tablet (5 mg total) by mouth 3  (three) times daily. Patient not taking: Reported on 09/13/2015 09/04/15   Charm Rings, NP  clonazePAM (KLONOPIN) 0.5 MG tablet Take 1 tablet (0.5 mg total) by mouth 2 (two) times daily as needed (anxiety). Patient not taking: Reported on 09/13/2015 09/04/15   Charm Rings, NP  divalproex (DEPAKOTE ER) 500 MG 24 hr tablet Take 1 tablet (500 mg total) by mouth 2 (two) times daily. Patient not taking: Reported on 09/13/2015 09/04/15   Charm Rings, NP  glycopyrrolate (ROBINUL) 2 MG tablet Take 1 tablet (2 mg total) by mouth 3 (three) times daily. Patient not taking: Reported on 09/13/2015 09/04/15   Charm Rings, NP  levETIRAcetam (KEPPRA) 500 MG tablet Take 1 tablet (500 mg total) by mouth 2 (two) times daily. Patient not taking: Reported on 09/13/2015 09/04/15   Charm Rings, NP  Lurasidone HCl 60 MG TABS Take 60 mg by mouth at bedtime. Patient not taking: Reported on 09/13/2015 09/04/15   Charm Rings, NP  metoprolol tartrate (LOPRESSOR) 25 MG tablet Take 1 tablet (25 mg total) by mouth 2 (two) times daily. Patient not taking: Reported on 09/13/2015 09/05/15   Benjiman Core, MD  mirtazapine (REMERON) 15 MG tablet Take 1 tablet (15 mg total) by mouth at bedtime. Patient not taking: Reported on 09/13/2015 09/04/15  Charm RingsJamison Y Lord, NP  QUEtiapine (SEROQUEL) 50 MG tablet Take 1 tablet (50 mg total) by mouth 2 (two) times daily. Patient not taking: Reported on 09/13/2015 09/04/15   Charm RingsJamison Y Lord, NP    Physical Exam: Filed Vitals:   09/13/15 2300 09/13/15 2333 09/13/15 2345 09/14/15 0030  BP: 114/81  112/68 107/58  Pulse: 93  88 92  Temp:  99.3 F (37.4 C)    TempSrc:  Rectal    Resp:   26 22  SpO2: 97%  98% 100%      Constitutional: NAD, calm, comfortable Eyes: PERRL, lids and conjunctivae normal ENMT: Mucous membranes are moist. Posterior pharynx clear of any exudate or lesions.Normal dentition.  Neck: normal, supple, no masses, no thyromegaly Respiratory: clear to auscultation  bilaterally, no wheezing, no crackles. Normal respiratory effort. No accessory muscle use.  Cardiovascular: Regular rate and rhythm, no murmurs / rubs / gallops. No extremity edema. 2+ pedal pulses. No carotid bruits.  Abdomen: no tenderness, no masses palpated. No hepatosplenomegaly. Bowel sounds positive.  Musculoskeletal: no clubbing / cyanosis. No joint deformity upper and lower extremities. Good ROM, no contractures. Normal muscle tone.  Skin: no rashes, lesions, ulcers. No induration Neurologic: CN 2-12 grossly intact. Sensation intact, DTR normal. Strength 5/5 in all 4.  Psychiatric: Normal judgment and insight. Alert and oriented x 3. Normal mood.    Labs on Admission: I have personally reviewed following labs and imaging studies  CBC:  Recent Labs Lab 09/13/15 2258  WBC 5.8  NEUTROABS 4.3  HGB 14.8  HCT 43.9  MCV 91.5  PLT 199   Basic Metabolic Panel:  Recent Labs Lab 09/13/15 2258  NA 136  K 5.3*  CL 105  CO2 19*  GLUCOSE 94  BUN 19  CREATININE 2.51*  CALCIUM 9.7   GFR: CrCl cannot be calculated (Unknown ideal weight.). Liver Function Tests: No results for input(s): AST, ALT, ALKPHOS, BILITOT, PROT, ALBUMIN in the last 168 hours. No results for input(s): LIPASE, AMYLASE in the last 168 hours. No results for input(s): AMMONIA in the last 168 hours. Coagulation Profile: No results for input(s): INR, PROTIME in the last 168 hours. Cardiac Enzymes: No results for input(s): CKTOTAL, CKMB, CKMBINDEX, TROPONINI in the last 168 hours. BNP (last 3 results) No results for input(s): PROBNP in the last 8760 hours. HbA1C: No results for input(s): HGBA1C in the last 72 hours. CBG: No results for input(s): GLUCAP in the last 168 hours. Lipid Profile: No results for input(s): CHOL, HDL, LDLCALC, TRIG, CHOLHDL, LDLDIRECT in the last 72 hours. Thyroid Function Tests: No results for input(s): TSH, T4TOTAL, FREET4, T3FREE, THYROIDAB in the last 72 hours. Anemia  Panel: No results for input(s): VITAMINB12, FOLATE, FERRITIN, TIBC, IRON, RETICCTPCT in the last 72 hours. Urine analysis:    Component Value Date/Time   COLORURINE YELLOW 09/13/2015 2330   APPEARANCEUR CLEAR 09/13/2015 2330   LABSPEC 1.015 09/13/2015 2330   PHURINE 6.5 09/13/2015 2330   GLUCOSEU NEGATIVE 09/13/2015 2330   HGBUR NEGATIVE 09/13/2015 2330   BILIRUBINUR NEGATIVE 09/13/2015 2330   KETONESUR 15* 09/13/2015 2330   PROTEINUR 100* 09/13/2015 2330   NITRITE NEGATIVE 09/13/2015 2330   LEUKOCYTESUR NEGATIVE 09/13/2015 2330   Sepsis Labs: @LABRCNTIP (procalcitonin:4,lacticidven:4) )No results found for this or any previous visit (from the past 240 hour(s)).   Radiological Exams on Admission: Dg Chest 2 View  09/13/2015  CLINICAL DATA:  Larey SeatFell today.  Altered mental status. EXAM: CHEST  2 VIEW COMPARISON:  05/29/2014 FINDINGS: Mild left  base opacity may relate to the shallow inspiration. No confluent consolidation. No effusion. Normal pulmonary vasculature. IMPRESSION: Mild left base opacity, accentuated by the shallow inspiration. No confluent consolidation. This may be atelectatic. Electronically Signed   By: Ellery Plunkaniel R Mitchell M.D.   On: 09/13/2015 23:51   Ct Head Wo Contrast  09/13/2015  CLINICAL DATA:  40 year old male with altered mental status EXAM: CT HEAD WITHOUT CONTRAST TECHNIQUE: Contiguous axial images were obtained from the base of the skull through the vertex without intravenous contrast. COMPARISON:  Brain MRI dated 01/23/2015 and head CT dated 12/31/2012 FINDINGS: There is mild cortical atrophy and chronic microvascular ischemic changes advanced for the patient's age. There is no acute intracranial hemorrhage. No mass effect or midline shift noted. There is mild mucoperiosteal thickening of paranasal sinuses. No air-fluid levels. The mastoid air cells are clear. The calvarium is intact. IMPRESSION: No acute intracranial hemorrhage. Minimal diffuse atrophy and chronic  microvascular ischemic disease. If symptoms persist and there are no contraindications, MRI may provide better evaluation if clinically indicated Electronically Signed   By: Elgie CollardArash  Radparvar M.D.   On: 09/13/2015 23:53   Dg Knee Complete 4 Views Left  09/13/2015  CLINICAL DATA:  Fall today.  Anterior left knee abrasions. EXAM: LEFT KNEE - COMPLETE 4+ VIEW COMPARISON:  12/31/2012. FINDINGS: Four views study shows no fracture. No subluxation or dislocation. No evidence for joint effusion. Antegrade tibial IM nail is partially visualized. IMPRESSION: Negative. Electronically Signed   By: Kennith CenterEric  Mansell M.D.   On: 09/13/2015 23:50    EKG: Independently reviewed.  Assessment/Plan Active Problems:   Dehydration    Dehydration vs seizure -  IVF (got 3L in ED)  Resume home meds for seizure  Will admit to hospital for overnight obs though I suspect he will be here longer than that due to having no where to go.]  SW consulted     DVT prophylaxis: Lovenox Code Status: Full Family Communication: No family in room Consults called: None Admission status: Admit to obs   Hillary BowGARDNER, Ghislaine Harcum M. DO [facility] Pager [phone] from 7PM-7AM  If 7AM-7PM, please contact the day physician for the patient [email or web address] Password [email or web address]  09/14/2015, 1:23 AM

## 2015-09-14 NOTE — ED Notes (Signed)
Dr. Gardner at the bedside.  

## 2015-09-15 DIAGNOSIS — N179 Acute kidney failure, unspecified: Secondary | ICD-10-CM | POA: Diagnosis not present

## 2015-09-15 DIAGNOSIS — R569 Unspecified convulsions: Secondary | ICD-10-CM | POA: Diagnosis not present

## 2015-09-15 DIAGNOSIS — E872 Acidosis: Secondary | ICD-10-CM | POA: Diagnosis not present

## 2015-09-15 DIAGNOSIS — E860 Dehydration: Secondary | ICD-10-CM | POA: Diagnosis not present

## 2015-09-15 LAB — CBC
HCT: 39.4 % (ref 39.0–52.0)
Hemoglobin: 13 g/dL (ref 13.0–17.0)
MCH: 30.2 pg (ref 26.0–34.0)
MCHC: 33 g/dL (ref 30.0–36.0)
MCV: 91.6 fL (ref 78.0–100.0)
PLATELETS: 180 10*3/uL (ref 150–400)
RBC: 4.3 MIL/uL (ref 4.22–5.81)
RDW: 13.4 % (ref 11.5–15.5)
WBC: 3.5 10*3/uL — AB (ref 4.0–10.5)

## 2015-09-15 LAB — BASIC METABOLIC PANEL
ANION GAP: 6 (ref 5–15)
BUN: 14 mg/dL (ref 6–20)
CALCIUM: 8.7 mg/dL — AB (ref 8.9–10.3)
CO2: 22 mmol/L (ref 22–32)
Chloride: 111 mmol/L (ref 101–111)
Creatinine, Ser: 1.68 mg/dL — ABNORMAL HIGH (ref 0.61–1.24)
GFR, EST AFRICAN AMERICAN: 57 mL/min — AB (ref >60)
GFR, EST NON AFRICAN AMERICAN: 49 mL/min — AB (ref >60)
Glucose, Bld: 89 mg/dL (ref 65–99)
Potassium: 4.2 mmol/L (ref 3.5–5.1)
Sodium: 139 mmol/L (ref 135–145)
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
## 2015-09-15 NOTE — Progress Notes (Signed)
Nutrition Brief Note  Patient identified on the Malnutrition Screening Tool (MST) Report  Wt Readings from Last 15 Encounters:  09/14/15 198 lb 9.6 oz (90.084 kg)  08/18/15 200 lb 4 oz (90.833 kg)  02/17/15 205 lb (92.987 kg)  12/30/14 206 lb (93.441 kg)  10/29/12 220 lb (99.791 kg)   Glen Johnson is a 40 y.o. male with medical history significant of TBI, seizures, has guardian. Patient presents to the ED via [employer]. He apparently left his group home to go visit his mother (which he isnt allowed to do), mother kicked him out and told him to go back to group home. Unfortunately he didn't make it back, had syncope and collapse he says, [employer] apparently found him wandering around after mother reported him missing when he didn't make it back to the group home.  Pt admitted with dehydration and lactic acidosis.   Pt reveals he always has a good appetite and denies any weight loss. He shares he was not eating well PTA, "because the old dude at the group home stopped cooking". He ate 100% of his breakfast this morning.   Nutrition-Focused physical exam completed. Findings are no fat depletion, no muscle depletion, and no edema.   Body mass index is 29.31 kg/(m^2). Patient meets criteria for overweight based on current BMI.   Current diet order is Heart Healthy, patient is consuming approximately 100% of meals at this time. Labs and medications reviewed.   No nutrition interventions warranted at this time. If nutrition issues arise, please consult RD.   Glen Johnson A. Mayford KnifeWilliams, RD, LDN, CDE Pager: [phone] After hours Pager: [phone]

## 2015-09-15 NOTE — Care Management Note (Signed)
Case Management Note  Patient Details  Name: Glen Johnson MRN: [id number] Date of Birth: 1975-07-09  Subjective/Objective:                    Action/Plan: Pt in with dehydration. He lives in a group home but supposedly has been kicked out. CM following for discharge needs.   Expected Discharge Date:                  Expected Discharge Plan:     In-House Referral:     Discharge planning Services     Post Acute Care Choice:    Choice offered to:     DME Arranged:    DME Agency:     HH Arranged:    HH Agency:     Status of Service:     If discussed at [employer] Meetings, dates discussed:    Additional Comments:  Kermit BaloKelli F Yania Bogie, RN 09/15/2015, 3:40 PM
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
## 2015-09-15 NOTE — Evaluation (Signed)
Physical Therapy Evaluation Patient Details Name: Glen Johnson Salome MRN: [id number] DOB: 01-10-1976 Today's Date: 09/15/2015   History of Present Illness  pt is a 40 y/o male with h/o TBI and seiures presenting to ED with syncope and collapse.  Clinical Impression  Pt admitted with/for syncope and falling.  He needs a group home that has responsible management.  He is mildly unsteady, but adequately safe in a supervised/indepedent setting like a group home  Pt currently limited functionally due to the problems listed below.  (see problems list.)  Pt will benefit from PT to maximize function and safety to be able to get to that group home environment..     Follow Up Recommendations Home health PT;Other (comment) (A safety evaluation at the group home)    Equipment Recommendations  Cane (single point, pt does not want the RW)    Recommendations for Other Services       Precautions / Restrictions Precautions Precautions: Fall Restrictions Weight Bearing Restrictions: No      Mobility  Bed Mobility Overal bed mobility: Modified Independent                Transfers Overall transfer level: Modified independent               General transfer comment: Impulsive, adequately safe.  Ambulation/Gait Ambulation/Gait assistance: Supervision Ambulation Distance (Feet): 200 Feet (150 feet with cane) Assistive device: Rolling walker (2 wheeled);Straight cane Gait Pattern/deviations: Step-through pattern Gait velocity: slower, but can speed up a bit which does not smooth out his gait. Gait velocity interpretation: Below normal speed for age/gender General Gait Details: mildly unsteady, staggery, scissoring. Less with RW, but some with cane.  Carries cane more than he should, but does help smooth out his gait a bit.  Pt does not want the RW.  Stairs            Wheelchair Mobility    Modified Rankin (Stroke Patients Only)       Balance                                             Pertinent Vitals/Pain Pain Assessment: Faces Faces Pain Scale: Hurts little more Pain Location: knees Pain Descriptors / Indicators: Sore Pain Intervention(s): Monitored during session    Home Living Family/patient expects to be discharged to:: Group home Living Arrangements: Group Home                    Prior Function Level of Independence: Independent               Hand Dominance        Extremity/Trunk Assessment               Lower Extremity Assessment: Overall WFL for tasks assessed (proximal weakness, tests weaker than function would dictate)         Communication   Communication: Expressive difficulties  Cognition Arousal/Alertness: Awake/alert Behavior During Therapy: Flat affect;Impulsive Overall Cognitive Status: History of cognitive impairments - at baseline                      General Comments      Exercises        Assessment/Plan    PT Assessment Patient needs continued PT services  PT Diagnosis Abnormality of gait;Generalized weakness   PT Problem List Decreased strength;Decreased  activity tolerance;Decreased balance;Decreased mobility;Decreased knowledge of use of DME;Pain  PT Treatment Interventions DME instruction;Gait training;Stair training;Functional mobility training;Therapeutic activities;Balance training;Patient/family education   PT Goals (Current goals can be found in the Care Plan section) Acute Rehab PT Goals Patient Stated Goal: pt did not participate in the goal setting PT Goal Formulation: With family Time For Goal Achievement: 09/22/15 Potential to Achieve Goals: Good    Frequency Min 3X/week   Barriers to discharge Decreased caregiver support      Co-evaluation               End of Session   Activity Tolerance: Patient limited by pain;Patient tolerated treatment well Patient left: in bed;with call bell/phone within reach Nurse Communication:  Mobility status    Functional Assessment Tool Used: clinical judgement Functional Limitation: Mobility: Walking and moving around Mobility: Walking and Moving Around Current Status ([id number](G8978): At least 1 percent but less than 20 percent impaired, limited or restricted Mobility: Walking and Moving Around Goal Status [phone](G8979): At least 1 percent but less than 20 percent impaired, limited or restricted    Time: 1030-1101 PT Time Calculation (min) (ACUTE ONLY): 31 min   Charges:   PT Evaluation $PT Eval Moderate Complexity: 1 Procedure PT Treatments $Gait Training: 8-22 mins   PT G Codes:   PT G-Codes **NOT FOR INPATIENT CLASS** Functional Assessment Tool Used: clinical judgement Functional Limitation: Mobility: Walking and moving around Mobility: Walking and Moving Around Current Status (U9811(G8978): At least 1 percent but less than 20 percent impaired, limited or restricted Mobility: Walking and Moving Around Goal Status [phone](G8979): At least 1 percent but less than 20 percent impaired, limited or restricted    Hughie Melroy, Eliseo GumKenneth V 09/15/2015, 11:16 AM 09/15/2015  [place] BingKen Masashi [place], PT [phone]  (pager)

## 2015-09-16 DIAGNOSIS — R569 Unspecified convulsions: Secondary | ICD-10-CM | POA: Diagnosis not present

## 2015-09-16 DIAGNOSIS — F4325 Adjustment disorder with mixed disturbance of emotions and conduct: Secondary | ICD-10-CM

## 2015-09-16 DIAGNOSIS — E860 Dehydration: Secondary | ICD-10-CM | POA: Diagnosis not present

## 2015-09-16 DIAGNOSIS — E872 Acidosis: Secondary | ICD-10-CM | POA: Diagnosis not present

## 2015-09-16 DIAGNOSIS — N179 Acute kidney failure, unspecified: Secondary | ICD-10-CM | POA: Diagnosis not present

## 2015-09-16 NOTE — Clinical Social Work Note (Signed)
Clinical Social Work Assessment  Patient Details  Name: Glen Johnson MRN: [id number] Date of Birth: [birth year]  Date of referral:  09/16/15               Reason for consult:  Facility Placement                Permission sought to share information with:  Family Supports Permission granted to share information::  Yes, Verbal Permission Granted, Yes, Release of Information Signed (pt mom is legal guardian)  Name::     Glen Johnson  Agency[facility]  [facility], [facility]  Relationship::  mom, legal guardian  Contact Information:     Housing/Transportation Living arrangements for the past 2 months:   (group home) Source of Information:  Patient, Facility, Power of Pensions consultantAttorney, Parent Patient Interpreter Needed:  None Criminal Activity/Legal Involvement Pertinent to Current Situation/Hospitalization:  No - Comment as needed Significant Relationships:  Parents Lives with:  Facility Resident Do you feel safe going back to the place where you live?  No Need for family participation in patient care:  Yes (Comment) (mom makes decisions)  Care giving concerns:  Pt was at [facility]- pt was able to escape and was found wandering in streets after he attempted to go back and stay with his mom- found dehydrated and had fallen   Office managerocial Worker assessment / plan:  CSW following to assist with alternative placement where pt will be safe.    Spoke with pt who is unable to provide sufficient information and main concerns with Group home was lack of cooking and not doing his laundry- very preoccupied with this.  Not able to participate in his care plan.  CSW spoke with pt mom, [facility] coorindator, and APS worker to discuss plan.  Plan for SNF placement in locked unit while [facility] works on finding TBI placement  Employment status:  Disabled (Comment on whether or not currently receiving Disability) Insurance information:  [place] PT Recommendations:  Home with Home Health, 24 Hour  Supervision Information / Referral to community resources:  Skilled Nursing Facility  Patient/Family's Response to care:  Pt mom/legal guardian is agreeable to whatever plan will ensure her sons safety.  Very realistic about the difficulties with finding placement for her son given his behaviors and impulsive leaving- agreeable to locked unit.  Patient/Family's Understanding of and Emotional Response to Diagnosis, Current Treatment, and Prognosis:  Mom is very knowledgeable of pt condition and wants to be as involved as possible but has been very overwhelmed by trying to care for pt over the years with his aggressive bouts.  Emotional Assessment Appearance:  Appears stated age Attitude/Demeanor/Rapport:  Inconsistent Affect (typically observed):  Pleasant Orientation:  Oriented to Self, Oriented to Place, Oriented to Situation Alcohol / Substance use:  Not Applicable Psych involvement (Current and /or in the community):  Yes (Comment) (consult requested for eval)  Discharge Needs  Concerns to be addressed:  Basic Needs, Care Coordination, Home Safety Concerns Readmission within the last 30 days:  Yes Current discharge risk:  Physical Impairment, Lack of support system, Cognitively Impaired Barriers to Discharge:  No SNF bed, [address]   Glen Johnson, Glen Bento M, LCSW 09/16/2015, 1:06 PM

## 2015-09-16 NOTE — Progress Notes (Signed)
Pt refused lab draw X2. Dr Catha GosselinMikhail notified.

## 2015-09-16 NOTE — Progress Notes (Signed)
PROGRESS NOTE    Glen Johnson  ZOX:[id number] DOB: [birth year] DOA: 09/13/2015 PCP: Glen GermanEdwin A Avbuere, MD   Brief Narrative:  HPI on 09/14/2015 by Dr. Lyda PeroneJared Gardner Glen Everyravis Lamont Johnson is a 40 y.o. male with medical history significant of TBI, seizures, has guardian. Patient presents to the ED via [employer]. He apparently left his group home to go visit his mother (which he isnt allowed to do), mother kicked him out and told him to go back to group home. Unfortunately he didn't make it back, had syncope and collapse he says, [employer] apparently found him wandering around after mother reported him missing when he didn't make it back to the group home. Assessment & Plan   Dehydration/Lactic acidosis -lactic acid 4.43 upon admission -Given IVF -Lactic acid now 1  Acute on chronic kidney disease, stage III -Creatine 2.51 upon admission, currently 1.68 -Baseline creatinine 1.5-1.7 -likely due to dehydration -Continue to monitor BMP- patient refused labs this morning, states he will think about it.  Seizure disorder -Supposedly found by police wandering around -patient does not remember the events yesterday after leaving his mom -CT head: no acute intracranial hemorrhage  -Continue seizure precautions -Continue keppra, depakote  Mild hyperkalemia -Will give IVF hydration and monitor BMP -Likely complicated by AKI -Pending labs this morning  DVT Prophylaxis  lovenox  Code Status: Full  Family Communication: None at bedside  Disposition Plan: Admitted for observation. Social work consulted as patient's group home is supposedly not accepting him back. Currently looking for placement. Have asked psychiatry for mental evaluation.  [facility]  Consultants Psychiatry   Procedures  None  Antibiotics   Anti-infectives    None      Subjective:   Glen Johnson Glen Johnson seen and examined today.  Patient has no complaints this morning.   Denies pain, chest pain, shortness of breath, abdominal pain, headache, dizziness.   Objective:   Filed Vitals:   09/15/15 1435 09/15/15 2139 09/16/15 0520 09/16/15 0938  BP: 102/76 121/71 95/61 102/69  Pulse: 58 79 56 62  Temp: 98.3 F (36.8 C) 98.4 F (36.9 C) 98 F (36.7 C)   TempSrc:      Resp: 15 18 18    Height:      Weight:      SpO2: 100% 98% 99%     Intake/Output Summary (Last 24 hours) at 09/16/15 1048 Last data filed at 09/15/15 1903  Gross per 24 hour  Intake    240 ml  Output      0 ml  Net    240 ml   Filed Weights   09/14/15 0200 09/14/15 0235  Weight: 90.8 kg (200 lb 2.8 oz) 90.084 kg (198 lb 9.6 oz)    Exam  General: Well developed, well nourished, no apparent distress  HEENT: NCAT, , mucous membranes moist.   Cardiovascular: S1 S2 auscultated, no murmurs, RRR  Respiratory: Clear to auscultation bilaterally with equal chest rise  Abdomen: Soft, nontender, nondistended, + bowel sounds  Extremities: warm dry without cyanosis clubbing or edema  Neuro: AAO x3, nonfocal (h/o TBI)  Psych: Appropriate   Data Reviewed: I have personally reviewed following labs and imaging studies  CBC:  Recent Labs Lab 09/13/15 2258 09/15/15 0546  WBC 5.8 3.5*  NEUTROABS 4.3  --   HGB 14.8 13.0  HCT 43.9 39.4  MCV 91.5 91.6  PLT 199 180   Basic Metabolic Panel:  Recent Labs Lab 09/13/15 2258 09/15/15 0546  NA 136 139  K 5.3* 4.2  CL 105 111  CO2 19* 22  GLUCOSE 94 89  BUN 19 14  CREATININE 2.51* 1.68*  CALCIUM 9.7 8.7*   GFR: Estimated Creatinine Clearance: 64.9 mL/min (by C-G formula based on Cr of 1.68). Liver Function Tests: No results for input(s): AST, ALT, ALKPHOS, BILITOT, PROT, ALBUMIN in the last 168 hours. No results for input(s): LIPASE, AMYLASE in the last 168 hours. No results for input(s): AMMONIA in the last 168 hours. Coagulation Profile: No results for input(s): INR, PROTIME in the last 168 hours. Cardiac Enzymes: No  results for input(s): CKTOTAL, CKMB, CKMBINDEX, TROPONINI in the last 168 hours. BNP (last 3 results) No results for input(s): PROBNP in the last 8760 hours. HbA1C: No results for input(s): HGBA1C in the last 72 hours. CBG: No results for input(s): GLUCAP in the last 168 hours. Lipid Profile: No results for input(s): CHOL, HDL, LDLCALC, TRIG, CHOLHDL, LDLDIRECT in the last 72 hours. Thyroid Function Tests: No results for input(s): TSH, T4TOTAL, FREET4, T3FREE, THYROIDAB in the last 72 hours. Anemia Panel: No results for input(s): VITAMINB12, FOLATE, FERRITIN, TIBC, IRON, RETICCTPCT in the last 72 hours. Urine analysis:    Component Value Date/Time   COLORURINE YELLOW 09/13/2015 2330   APPEARANCEUR CLEAR 09/13/2015 2330   LABSPEC 1.015 09/13/2015 2330   PHURINE 6.5 09/13/2015 2330   GLUCOSEU NEGATIVE 09/13/2015 2330   HGBUR NEGATIVE 09/13/2015 2330   BILIRUBINUR NEGATIVE 09/13/2015 2330   KETONESUR 15* 09/13/2015 2330   PROTEINUR 100* 09/13/2015 2330   NITRITE NEGATIVE 09/13/2015 2330   LEUKOCYTESUR NEGATIVE 09/13/2015 2330   Sepsis Labs: @LABRCNTIP (procalcitonin:4,lacticidven:4)  )No results found for this or any previous visit (from the past 240 hour(s)).    Radiology Studies: No results found.   Scheduled Meds: . bacitracin  1 application Topical BID  . busPIRone  5 mg Oral TID  . divalproex  500 mg Oral BID  . enoxaparin (LOVENOX) injection  40 mg Subcutaneous Q24H  . glycopyrrolate  2 mg Oral TID  . levETIRAcetam  500 mg Oral BID  . lurasidone  60 mg Oral QHS  . metoprolol tartrate  25 mg Oral BID  . mirtazapine  15 mg Oral QHS  . QUEtiapine  50 mg Oral BID   Continuous Infusions:      Time Spent in minutes   30 minutes  Bethania Schlotzhauer D.O. on 09/16/2015 at 10:48 AM  Between 7am to 7pm - Pager - [phone]  After 7pm go to [email or web address] - password [email or web address]  And look for the night coverage person covering for me after hours  [facility]  Group Office  [phone]
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
## 2015-09-16 NOTE — [place] FL2 (Signed)
[place] MEDICAID FL2 LEVEL OF CARE SCREENING TOOL     IDENTIFICATION  Patient Name: Glen Johnson Birthdate: 05-19-1975 Sex: male Admission Date (Current Location): 09/13/2015  [facility] [place]   Facility and Address:  [facility], [address], [place], [place] [address]      Provider Number: [id number]  Attending Physician Name and Address:  Edsel PetrinMaryann Mikhail, DO  Relative Name and Phone Number:       Current Level of Care: Hospital Recommended Level of Care: Assisted Living Facility Prior Approval Number:    Date Approved/Denied:   PASRR Number:    Discharge Plan:  (ALF)    Current Diagnoses: Patient Active Problem List   Diagnosis Date Noted  . Dehydration 09/14/2015  . AKI (acute kidney injury) (HCC) 09/14/2015  . Lactic acidosis 09/14/2015  . Schizophrenia (HCC)   . Seizures (HCC) 02/18/2015  . Abnormality of gait 02/17/2015  . Schizophrenia, unspecified type (HCC)   . Involuntary commitment   . Violent behavior   . 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    Orientation RESPIRATION BLADDER Height & Weight     Self, Time, Place  Normal Continent Weight: 90.084 kg (198 lb 9.6 oz) Height:  5\' 9"  (175.3 cm)  BEHAVIORAL SYMPTOMS/MOOD NEUROLOGICAL BOWEL NUTRITION STATUS    Convulsions/Seizures Continent Diet (cardiac)  AMBULATORY STATUS COMMUNICATION OF NEEDS Skin   Supervision Verbally Skin abrasions (wounds on knees from fall)                       Personal Care Assistance Level of Assistance  Bathing, Dressing, Feeding Bathing Assistance: Limited assistance Feeding assistance: Independent Dressing Assistance: Limited assistance     Functional Limitations Info             SPECIAL CARE FACTORS FREQUENCY  PT (By licensed PT), OT (By licensed OT)     PT Frequency: 3/wk OT Frequency: 3/wk            Contractures      Additional Factors  Info  Code Status, Allergies Code Status Info: FULL Allergies Info: NKA           Current Medications (09/16/2015):  This is the current hospital active medication list Current Facility-Administered Medications  Medication Dose Route Frequency Provider Last Rate Last Dose  . bacitracin ointment 1 application  1 application Topical BID Roxy Horsemanobert Browning, PA-C   1 application at 09/16/15 [phone]  . busPIRone (BUSPAR) tablet 5 mg  5 mg Oral TID Hillary BowJared Johnson Gardner, DO   5 mg at 09/16/15 1658  . clonazePAM (KLONOPIN) tablet 0.5 mg  0.5 mg Oral BID PRN Hillary BowJared Johnson Gardner, DO      . divalproex (DEPAKOTE ER) 24 hr tablet 500 mg  500 mg Oral BID Hillary BowJared Johnson Gardner, DO   500 mg at 09/16/15 [id number]  . enoxaparin (LOVENOX) injection 40 mg  40 mg Subcutaneous Q24H Hillary BowJared Johnson Gardner, DO   40 mg at 09/14/15 1207  . glycopyrrolate (ROBINUL) tablet 2 mg  2 mg Oral TID Hillary BowJared Johnson Gardner, DO   2 mg at 09/16/15 1658  . levETIRAcetam (KEPPRA) tablet 500 mg  500 mg Oral BID Hillary BowJared Johnson Gardner, DO   500 mg at 09/16/15 0941  . lurasidone (LATUDA) tablet 60 mg  60 mg Oral QHS Hillary BowJared Johnson Gardner, DO   60 mg at 09/15/15 2259  . metoprolol tartrate (LOPRESSOR) tablet 25 mg  25 mg Oral BID Hillary BowJared Johnson Gardner, DO   Stopped at 09/16/15 [phone]  . mirtazapine (REMERON) tablet 15 mg  15 mg Oral QHS Hillary BowJared Johnson Gardner, DO   15 mg at 09/15/15 2301  . QUEtiapine (SEROQUEL) tablet 50 mg  50 mg Oral BID Hillary BowJared Johnson Gardner, DO   50 mg at 09/16/15 [id number]     Discharge Medications: Please see discharge summary for a list of discharge medications.  Relevant Imaging Results:  Relevant Lab Results:   Additional Information SS#: [id number]  Glen Johnson, Glen Johnson, [place]

## 2015-09-16 NOTE — [place] FL2 (Signed)
[place] MEDICAID FL2 LEVEL OF CARE SCREENING TOOL     IDENTIFICATION  Patient Name: Glen Johnson Birthdate: [birth year] Sex: male Admission Date (Current Location): 09/13/2015  [facility] [place]   Facility and Address:  The [facility], [address], [place], [place] [address]      Provider Number: [id number]  Attending Physician Name and Address:  Edsel PetrinMaryann Mikhail, DO  Relative Name and Phone Number:       Current Level of Care: Hospital Recommended Level of Care: Skilled Nursing Facility Prior Approval Number:    Date Approved/Denied:   PASRR Number:    Discharge Plan: SNF    Current Diagnoses: Patient Active Problem List   Diagnosis Date Noted  . Dehydration 09/14/2015  . AKI (acute kidney injury) (HCC) 09/14/2015  . Lactic acidosis 09/14/2015  . Schizophrenia (HCC)   . Seizures (HCC) 02/18/2015  . Abnormality of gait 02/17/2015  . Schizophrenia, unspecified type (HCC)   . Involuntary commitment   . Violent behavior   . 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    Orientation RESPIRATION BLADDER Height & Weight     Self, Time, Place  Normal Continent Weight: 90.084 kg (198 lb 9.6 oz) Height:  5\' 9"  (175.3 cm)  BEHAVIORAL SYMPTOMS/MOOD NEUROLOGICAL BOWEL NUTRITION STATUS    Convulsions/Seizures Continent Diet (cardiac)  AMBULATORY STATUS COMMUNICATION OF NEEDS Skin   Limited Assist (rolling walker- very unsteady per PT) Verbally Skin abrasions (wounds on knees from fall)                       Personal Care Assistance Level of Assistance  Bathing, Dressing, Feeding Bathing Assistance: Limited assistance Feeding assistance: Independent Dressing Assistance: Limited assistance     Functional Limitations Info             SPECIAL CARE FACTORS FREQUENCY  PT (By licensed PT), OT (By licensed OT)     PT Frequency: 3/wk OT Frequency: 3/wk             Contractures      Additional Factors Info  Code Status, Allergies Code Status Info: FULL Allergies Info: NKA           Current Medications (09/16/2015):  This is the current hospital active medication list Current Facility-Administered Medications  Medication Dose Route Frequency Provider Last Rate Last Dose  . bacitracin ointment 1 application  1 application Topical BID Roxy Horsemanobert Browning, PA-C   1 application at 09/16/15 [phone]  . busPIRone (BUSPAR) tablet 5 mg  5 mg Oral TID Hillary BowJared M Gardner, DO   5 mg at 09/16/15 [id number]  . clonazePAM (KLONOPIN) tablet 0.5 mg  0.5 mg Oral BID PRN Hillary BowJared M Gardner, DO      . divalproex (DEPAKOTE ER) 24 hr tablet 500 mg  500 mg Oral BID Hillary BowJared M Gardner, DO   500 mg at 09/16/15 [id number]  . enoxaparin (LOVENOX) injection 40 mg  40 mg Subcutaneous Q24H Hillary BowJared M Gardner, DO   40 mg at 09/14/15 1207  . glycopyrrolate (ROBINUL) tablet 2 mg  2 mg Oral TID Hillary BowJared M Gardner, DO   2 mg at 09/16/15 [id number]  . levETIRAcetam (KEPPRA) tablet 500 mg  500 mg Oral BID Hillary BowJared M Gardner, DO   500 mg at 09/16/15 0941  . lurasidone (LATUDA) tablet 60 mg  60 mg Oral QHS Hillary BowJared M Gardner, DO   60 mg at 09/15/15 2259  . metoprolol  tartrate (LOPRESSOR) tablet 25 mg  25 mg Oral BID Hillary BowJared M Gardner, DO   Stopped at 09/16/15 [phone]  . mirtazapine (REMERON) tablet 15 mg  15 mg Oral QHS Hillary BowJared M Gardner, DO   15 mg at 09/15/15 2301  . QUEtiapine (SEROQUEL) tablet 50 mg  50 mg Oral BID Hillary BowJared M Gardner, DO   50 mg at 09/16/15 [id number]     Discharge Medications: Please see discharge summary for a list of discharge medications.  Relevant Imaging Results:  Relevant Lab Results:   Additional Information SS#: [id number]  Izora RibasHoloman, Yarely Bebee M, [place]

## 2015-09-16 NOTE — Progress Notes (Signed)
Patient refused blood work, Craige CottaKirby MD notified at [phone].

## 2015-09-16 NOTE — Progress Notes (Addendum)
5pm [facility] not taking new admissions at this time  CSW spoke with medical director who suggests [facility]- referral sent  Supervisor also provided CSW with additional group home numbers specializing in male patients- Outward bounds- spoke with Francee Piccoloerek and faxed referral for review, other facilities did not have availability at this time  1pm CSW spoke with pt mother who confirms pt is not able to return home with her.  Pt mom is agreeable to whatever will be a safe DC plan for pt- discussed CSW working on locked SNF unit [place]- pt mom is agreeable to this plan  Pt mom states she found medical records from pt [facility] stay when he required TBI- she will provide to Cly[facility]Karen at [facility] so she can work on getting pt TBI placement  CSW has started [facility]- under manual review at this time- pt can not go to SNF without [facility] number approved  CSW attempted to fax referral to [facility]- unable to get a hold of facility administrator over the holiday  Merlyn LotJenna Holoman, [facility] Surgery CtrCSWA Clinical Social Worker [phone]

## 2015-09-17 DIAGNOSIS — E872 Acidosis: Secondary | ICD-10-CM | POA: Diagnosis not present

## 2015-09-17 DIAGNOSIS — F4325 Adjustment disorder with mixed disturbance of emotions and conduct: Secondary | ICD-10-CM | POA: Diagnosis not present

## 2015-09-17 DIAGNOSIS — R569 Unspecified convulsions: Secondary | ICD-10-CM

## 2015-09-17 DIAGNOSIS — E860 Dehydration: Secondary | ICD-10-CM | POA: Diagnosis not present

## 2015-09-17 DIAGNOSIS — N179 Acute kidney failure, unspecified: Secondary | ICD-10-CM

## 2015-09-17 LAB — BASIC METABOLIC PANEL
ANION GAP: 8 (ref 5–15)
BUN: 13 mg/dL (ref 6–20)
CALCIUM: 9.7 mg/dL (ref 8.9–10.3)
CHLORIDE: 105 mmol/L (ref 101–111)
CO2: 28 mmol/L (ref 22–32)
CREATININE: 1.66 mg/dL — AB (ref 0.61–1.24)
GFR calc Af Amer: 58 mL/min — ABNORMAL LOW (ref >60)
GFR, EST NON AFRICAN AMERICAN: 50 mL/min — AB (ref >60)
GLUCOSE: 83 mg/dL (ref 65–99)
POTASSIUM: 4.6 mmol/L (ref 3.5–5.1)
SODIUM: 141 mmol/L (ref 135–145)

## 2015-09-17 NOTE — Progress Notes (Signed)
Physical Therapy Treatment Patient Details Name: Glen Johnson MRN: [id number] DOB: [birth year] Today's Date: 09/17/2015    History of Present Illness pt is a 40 y/o male with h/o TBI and seiures presenting to ED with syncope and collapse.    PT Comments    Patient continues to demo unsteadiness with ambulation. Practiced use of RW this session with improvements noted with RW vs SPC and in least distractive environment. Stair training this session with min guard and cues for safety awareness. Pt tolerated session well and with no c/o pain and followed commands throughout session. Current plan remains appropriate.   Follow Up Recommendations  Home health PT;Other (comment) (A safety evaluation at the group home)     Equipment Recommendations  Cane;Rolling walker with 5" wheels    Recommendations for Other Services       Precautions / Restrictions Precautions Precautions: Fall Restrictions Weight Bearing Restrictions: No    Mobility  Bed Mobility Overal bed mobility: Modified Independent                Transfers Overall transfer level: Modified independent               General transfer comment: impulsive but followed commands and used AD with cues  Ambulation/Gait Ambulation/Gait assistance: Min guard;Min assist Ambulation Distance (Feet): 300 Feet (150 with SPC, 150 with RW) Assistive device: Rolling walker (2 wheeled);Straight cane Gait Pattern/deviations: Step-through pattern;Staggering left;Staggering right     General Gait Details: unsteady gait with frequent changes in gait velocity; staggering with use of SPC and with narrow BOS; Pt unable to safely use cane despite verbal and tactile cues with pt almost tripping over cane at times; pt with more steady gait with use of RW; required cues and assist at times for RW proximity   Stairs Stairs: Yes Stairs assistance: Min guard Stair Management: Two rails;Forwards Number of Stairs: 3 General  stair comments: close min guard for safety; pt unsteady but no LOB; educated on sequencing and safety awareness  Wheelchair Mobility    Modified Rankin (Stroke Patients Only)       Balance Overall balance assessment: Needs assistance;History of Falls   Sitting balance-Leahy Scale: Good     Standing balance support: During functional activity Standing balance-Leahy Scale: Poor                      Cognition Arousal/Alertness: Awake/alert Behavior During Therapy: Impulsive Overall Cognitive Status: History of cognitive impairments - at baseline                      Exercises      General Comments General comments (skin integrity, edema, etc.): pt agreeable to use of RW this session      Pertinent Vitals/Pain Pain Assessment: No/denies pain    Home Living                      Prior Function            PT Goals (current goals can now be found in the care plan section) Acute Rehab PT Goals Patient Stated Goal: none stated PT Goal Formulation: With family Time For Goal Achievement: 09/22/15 Potential to Achieve Goals: Good Progress towards PT goals: Progressing toward goals    Frequency  Min 3X/week    PT Plan Current plan remains appropriate    Co-evaluation             End of Session  Equipment Utilized During Treatment: Gait belt Activity Tolerance: Patient tolerated treatment well Patient left: in bed;with call bell/phone within reach;with bed alarm set;with family/visitor present     Time: [id number] PT Time Calculation (min) (ACUTE ONLY): 26 min  Charges:  $Gait Training: 8-22 mins $Therapeutic Activity: 8-22 mins                      Glen Johnson, PTA Pager: [phone]   09/17/2015, 11:26 AM

## 2015-09-17 NOTE — Progress Notes (Signed)
PROGRESS NOTE    Glen Johnson  ZOX:[id number] DOB: 04-Apr-1975 DOA: 09/13/2015 PCP: Dorrene GermanEdwin A Avbuere, MD   Brief Narrative:   Glen Johnson is a 40 y.o. male with medical history significant of TBI, seizures, has guardian. Patient presents to the ED via [employer]. He apparently left his group home to go visit his mother (which he isnt allowed to do), mother kicked him out and told him to go back to group home. Unfortunately he didn't make it back, had syncope and collapse he says, [employer] apparently found him wandering around after mother reported him missing when he didn't make it back to the group home. Assessment & Plan   Dehydration/Lactic acidosis -lactic acid 4.43 upon admission  resolved with fluid boluses and lactic acid  Is normalised.   Acute on chronic kidney disease, stage III -Creatine 2.51 upon admission, currently 1.6 -Baseline creatinine 1.5-1.7 -likely due to dehydration - improved.   Seizure disorder -Supposedly found by police wandering around -CT head: no acute intracranial hemorrhage  -Continue seizure precautions -Continue keppra, depakote  Mild hyperkalemia -Will give IVF hydration and monitor BMP -Likely complicated by AKI - resolved.     h/o Traumatic Brain Injury: - psychaitry consulted and recommended resuming most of his meds.   DVT Prophylaxis  lovenox  Code Status: Full  Family Communication: None at bedside  Disposition Plan: Admitted for observation. Social work consulted as patient's group home is supposedly not accepting him back. Currently looking for placement. Have asked psychiatry for mental evaluation.  Have asked to obtain records from [facility]  Consultants Psychiatry   Procedures  None  Antibiotics   Anti-infectives    None      Subjective:   Glen Johnson seen and examined today.  Patient has no complaints this morning. Walking in the hallway with PT.   Objective:   Filed Vitals:   09/16/15 2045 09/17/15 0435 09/17/15 1007 09/17/15 1349  BP:  130/80 120/63 115/80  Pulse:  100 61 62  Temp:  98.2 F (36.8 C)  98 F (36.7 C)  TempSrc:  Oral  Oral  Resp:  18  18  Height:      Weight:      SpO2: 99% 97%  99%    Intake/Output Summary (Last 24 hours) at 09/17/15 1634 Last data filed at 09/17/15 1300  Gross per 24 hour  Intake    342 ml  Output    650 ml  Net   -308 ml   Filed Weights   09/14/15 0200 09/14/15 0235  Weight: 90.8 kg (200 lb 2.8 oz) 90.084 kg (198 lb 9.6 oz)    Exam  General: Well developed, well nourished, no apparent distress  HEENT: NCAT, , mucous membranes moist.   Cardiovascular: S1 S2 auscultated, no murmurs, RRR  Respiratory: Clear to auscultation bilaterally with equal chest rise  Abdomen: Soft, nontender, nondistended, + bowel sounds  Extremities: warm dry without cyanosis clubbing or edema  Neuro: AAO x3, nonfocal (h/o TBI)     Data Reviewed: I have personally reviewed following labs and imaging studies  CBC:  Recent Labs Lab 09/13/15 2258 09/15/15 0546  WBC 5.8 3.5*  NEUTROABS 4.3  --   HGB 14.8 13.0  HCT 43.9 39.4  MCV 91.5 91.6  PLT 199 180   Basic Metabolic Panel:  Recent Labs Lab 09/13/15 2258 09/15/15 0546 09/17/15 0542  NA 136 139 141  K 5.3* 4.2 4.6  CL 105 111 105  CO2 19*  22 28  GLUCOSE 94 89 83  BUN 19 14 13   CREATININE 2.51* 1.68* 1.66*  CALCIUM 9.7 8.7* 9.7   GFR: Estimated Creatinine Clearance: 65.7 mL/min (by C-G formula based on Cr of 1.66). Liver Function Tests: No results for input(s): AST, ALT, ALKPHOS, BILITOT, PROT, ALBUMIN in the last 168 hours. No results for input(s): LIPASE, AMYLASE in the last 168 hours. No results for input(s): AMMONIA in the last 168 hours. Coagulation Profile: No results for input(s): INR, PROTIME in the last 168 hours. Cardiac Enzymes: No results for input(s): CKTOTAL, CKMB, CKMBINDEX, TROPONINI in the last 168 hours. BNP (last 3 results) No  results for input(s): PROBNP in the last 8760 hours. HbA1C: No results for input(s): HGBA1C in the last 72 hours. CBG: No results for input(s): GLUCAP in the last 168 hours. Lipid Profile: No results for input(s): CHOL, HDL, LDLCALC, TRIG, CHOLHDL, LDLDIRECT in the last 72 hours. Thyroid Function Tests: No results for input(s): TSH, T4TOTAL, FREET4, T3FREE, THYROIDAB in the last 72 hours. Anemia Panel: No results for input(s): VITAMINB12, FOLATE, FERRITIN, TIBC, IRON, RETICCTPCT in the last 72 hours. Urine analysis:    Component Value Date/Time   COLORURINE YELLOW 09/13/2015 2330   APPEARANCEUR CLEAR 09/13/2015 2330   LABSPEC 1.015 09/13/2015 2330   PHURINE 6.5 09/13/2015 2330   GLUCOSEU NEGATIVE 09/13/2015 2330   HGBUR NEGATIVE 09/13/2015 2330   BILIRUBINUR NEGATIVE 09/13/2015 2330   KETONESUR 15* 09/13/2015 2330   PROTEINUR 100* 09/13/2015 2330   NITRITE NEGATIVE 09/13/2015 2330   LEUKOCYTESUR NEGATIVE 09/13/2015 2330   Sepsis Labs: @LABRCNTIP (procalcitonin:4,lacticidven:4)  )No results found for this or any previous visit (from the past 240 hour(s)).    Radiology Studies: No results found.   Scheduled Meds: . bacitracin  1 application Topical BID  . busPIRone  5 mg Oral TID  . divalproex  500 mg Oral BID  . enoxaparin (LOVENOX) injection  40 mg Subcutaneous Q24H  . glycopyrrolate  2 mg Oral TID  . levETIRAcetam  500 mg Oral BID  . lurasidone  60 mg Oral QHS  . metoprolol tartrate  25 mg Oral BID  . mirtazapine  15 mg Oral QHS  . QUEtiapine  50 mg Oral BID   Continuous Infusions:      Time Spent in minutes   20 minutes  Shady Bradish D.O. on 09/17/2015 at 4:34 PM  Between 7am to 7pm - Pager - [phone]  After 7pm go to [email or web address] - password [email or web address]  And look for the night coverage person covering for me after hours  [facility] Office  [phone]

## 2015-09-18 DIAGNOSIS — E860 Dehydration: Secondary | ICD-10-CM | POA: Diagnosis not present

## 2015-09-18 DIAGNOSIS — F4325 Adjustment disorder with mixed disturbance of emotions and conduct: Secondary | ICD-10-CM | POA: Diagnosis not present
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
## 2015-09-18 NOTE — Progress Notes (Signed)
Physical Therapy Treatment Patient Details Name: Glen Johnson MRN: [id number] DOB: 21-Mar-1975 Today's Date: 09/18/2015    History of Present Illness pt is a 40 y/o male with h/o TBI and seiures presenting to ED with syncope and collapse.    PT Comments    Patient was eager to participate in therapy today and pleasant. Tolerated gait training and therex well. Current plan remains appropriate.   Follow Up Recommendations  Home health PT;Other (comment) (A safety evaluation at the group home)     Equipment Recommendations  Cane;Rolling walker with 5" wheels    Recommendations for Other Services       Precautions / Restrictions Precautions Precautions: Fall Restrictions Weight Bearing Restrictions: No    Mobility  Bed Mobility Overal bed mobility: Modified Independent                Transfers Overall transfer level: Modified independent               General transfer comment: impulsive but followed commands and used AD with cues  Ambulation/Gait Ambulation/Gait assistance: Min guard;Min assist Ambulation Distance (Feet): 350 Feet Assistive device: Rolling walker (2 wheeled);Straight cane Gait Pattern/deviations: Step-through pattern;Decreased stride length;Staggering left;Staggering right;Wide base of support     General Gait Details: pt continues to demo staggering R and L; cues and assist for safe use and proximity of RW   Stairs            Wheelchair Mobility    Modified Rankin (Stroke Patients Only)       Balance     Sitting balance-Leahy Scale: Good     Standing balance support: During functional activity Standing balance-Leahy Scale: Poor Standing balance comment: able to static stand without UE support                    Cognition Arousal/Alertness: Awake/alert Behavior During Therapy: Impulsive Overall Cognitive Status: History of cognitive impairments - at baseline                      Exercises  General Exercises - Lower Extremity Hip ABduction/ADduction: Strengthening;Both;10 reps;Standing Hip Flexion/Marching: Strengthening;Both;10 reps;Standing Mini-Sqauts: Strengthening;Both;10 reps;Standing    General Comments General comments (skin integrity, edema, etc.): attempted tandem and single leg stance but pt unable to hold for more than a 2-3 seconds      Pertinent Vitals/Pain Pain Assessment: No/denies pain    Home Living                      Prior Function            PT Goals (current goals can now be found in the care plan section) Acute Rehab PT Goals Patient Stated Goal: get a job again PT Goal Formulation: With family Time For Goal Achievement: 09/22/15 Potential to Achieve Goals: Good Progress towards PT goals: Progressing toward goals    Frequency  Min 3X/week    PT Plan Current plan remains appropriate    Co-evaluation             End of Session Equipment Utilized During Treatment: Gait belt Activity Tolerance: Patient tolerated treatment well Patient left: in chair;with chair alarm set;with call bell/phone within reach     Time: 0840-0908 PT Time Calculation (min) (ACUTE ONLY): 28 min  Charges:  $Gait Training: 8-22 mins $Therapeutic Exercise: 8-22 mins  G Codes:      Derek MoundKellyn R Dondi Aime Allaya Abbasi, PTA Pager: [phone]   09/18/2015, 9:18 AM

## 2015-09-18 NOTE — Progress Notes (Addendum)
2:00pm- CSW left message for the [facility] [place] (as suggested by GoogleCSW Supervisor) to check for availability. CSW to continue to seek support from Medical Director for placement options.  Patient's mom to come sign PASRR paperwork on Friday at 1pm.    [facility] and copy of legal guardianship papers. CSW left voicemail for patient's mom to call me back to arrange this.   [facility] does not currently have a bed but may have something later.  CSW to continue to try to find placement for patient.  Osborne Cascoadia Deyjah Kindel LCSWA [phone]

## 2015-09-19 DIAGNOSIS — E860 Dehydration: Secondary | ICD-10-CM | POA: Diagnosis not present

## 2015-09-19 DIAGNOSIS — F4325 Adjustment disorder with mixed disturbance of emotions and conduct: Secondary | ICD-10-CM | POA: Diagnosis not present

## 2015-09-19 DIAGNOSIS — N179 Acute kidney failure, unspecified: Secondary | ICD-10-CM | POA: Diagnosis not present

## 2015-09-19 NOTE — Clinical Social Work Note (Addendum)
CSW continuing to look for placement for patient.  CSW contacted:  [facility] [phone], Jimmye NormanLawanda Ray is reviewing patient's information and will contact CSW on weekend [facility] [phone], Elenor QuinonesStella Fowler will review patient's information and contact CSW  [facility] [phone]-no beds available [facility] [phone], left a message did not hear back [facility] attempted to call [phone], but voice mail box was full, not able to leave message.  [facility] [phone], spoke to Lesle Chriserek McDowell he will review patient's information and will contact CSW over weekend if they can evaluate him. CSW [facility] to Con[place]augh [place]ka [facility] [phone] and she will evaluate patient on Saturday to determine if they can take him.  [facility], [address] [facility], and [facility] all [facility].  CSW to continue to work on trying to find placement for patient, guardianship paperwork has been received from patient's mother who is his legal guardian.  [facility] requested information was faxed to [facility], awaiting for response from manual review.  Glen KnackEric R. Dupree Johnson, MSW, Glen MajorsLCSWA [phone] 09/19/2015 6:24 PM
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
## 2015-09-20 DIAGNOSIS — F4325 Adjustment disorder with mixed disturbance of emotions and conduct: Secondary | ICD-10-CM | POA: Diagnosis not present

## 2015-09-20 DIAGNOSIS — E860 Dehydration: Secondary | ICD-10-CM | POA: Diagnosis not present
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
## 2015-09-20 NOTE — Clinical Social Work Note (Signed)
Clinical Social Worker contacted [facility] [phone] and left detailed voice message with Lesle ChrisDerek McDowell requesting a returned phone call.   CSW also contacted Tameka at [facility] [phone] who has scheduled an evaluation with patient on today, 7/8 at [facility]PM. CSW has not received phone call from [facility] at this time.  Case discussed with CSW AD.  CSW will continue to follow patient and pt's family for continued support and to facilitate d/c needs once stable.   Derenda FennelBashira Sheilah Rayos, MSW, LCSWA [phone] 09/20/2015 3:25 PM

## 2015-09-20 NOTE — Progress Notes (Signed)
PROGRESS NOTE    Glen Johnson  ZOX:[id number] DOB: 01/17/1976 DOA: 09/13/2015 PCP: Dorrene German, MD   Brief Narrative:   Glen Johnson is a 40 y.o. male with medical history significant of TBI, seizures, has guardian. Patient presents to the ED via [employer]. He apparently left his group home to go visit his mother (which he isnt allowed to do), mother kicked him out and told him to go back to group home. Unfortunately he didn't make it back, had syncope and collapse he says, [employer] apparently found him wandering around after mother reported him missing when he didn't make it back to the group home.  currently he is medically stable and awaiting placement to a group home. No new changes .  slughtly angry today that he is still here.  No family at bedside.   Assessment & Plan   Dehydration/Lactic acidosis -lactic acid 4.43 upon admission  resolved with fluid boluses and lactic acid  Is normalised.  He is asymptomatic. No signs of dehdyration and no new complaints.   Acute on chronic kidney disease, stage III -Creatine 2.51 upon admission, currently 1.6 -Baseline creatinine 1.5-1.7 -likely due to dehydration - improved.  - no new changes in management.  Seizure disorder -Supposedly found by police wandering around -CT head: no acute intracranial hemorrhage  -Continue seizure precautions -Continue keppra, depakote - no new seizures.  Mild hyperkalemia -Likely complicated by AKI - resolved.     h/o Traumatic Brain Injury: - psychaitry consulted and recommended resuming most of his meds.    Currently awwiating placement.   DVT Prophylaxis  lovenox  Code Status: Full  Family Communication: discussed with mom at bedside on 7/5. None at bedside.   Disposition Plan: Admitted for observation. Currently looking for placement.   Consultants Psychiatry   Procedures  None  Antibiotics   Anti-infectives    None      Subjective:   Glen Johnson  seen and examined today.  Patient has no complaints this morning. Wants to be discharged.   Objective:   Filed Vitals:   09/19/15 2148 09/20/15 0523 09/20/15 0928 09/20/15 1348  BP: 135/90 104/66 110/64 132/85  Pulse: 66 56 64 119  Temp: 98.5 F (36.9 C) 98.6 F (37 C)  98.4 F (36.9 C)  TempSrc:      Resp: Height:      Weight:      SpO2: 100% 95%  90%    Intake/Output Summary (Last 24 hours) at 09/20/15 1735 Last data filed at 09/20/15 1300  Gross per 24 hour  Intake    450 ml  Output      0 ml  Net    450 ml   Filed Weights   09/14/15 0200 09/14/15 0235  Weight: 90.8 kg (200 lb 2.8 oz) 90.084 kg (198 lb 9.6 oz)    Exam  General: Well developed, well nourished, no apparent distress  HEENT: NCAT, , mucous membranes moist.   Cardiovascular: S1 S2 auscultated, no murmurs, RRR  Respiratory: Clear to auscultation bilaterally with equal chest rise  Abdomen: Soft, nontender, nondistended, + bowel sounds  Extremities: warm dry without cyanosis clubbing or edema  Neuro: Alert , nonfocal (h/o TBI)     Data Reviewed: I have personally reviewed following labs and imaging studies  CBC:  Recent Labs Lab 09/13/15 2258 09/15/15 0546  WBC 5.8 3.5*  NEUTROABS 4.3  --   HGB 14.8 13.0  HCT 43.9 39.4  MCV 91.5  91.6  PLT 199 180   Basic Metabolic Panel:  Recent Labs Lab 09/13/15 2258 09/15/15 0546 09/17/15 0542  NA 136 139 141  K 5.3* 4.2 4.6  CL 105 111 105  CO2 19* 22 28  GLUCOSE 94 89 83  BUN 19 14 13   CREATININE 2.51* 1.68* 1.66*  CALCIUM 9.7 8.7* 9.7   GFR: Estimated Creatinine Clearance: 65.7 mL/min (by C-G formula based on Cr of 1.66). Liver Function Tests: No results for input(s): AST, ALT, ALKPHOS, BILITOT, PROT, ALBUMIN in the last 168 hours. No results for input(s): LIPASE, AMYLASE in the last 168 hours. No results for input(s): AMMONIA in the last 168 hours. Coagulation Profile: No results for input(s): INR, PROTIME in the last  168 hours. Cardiac Enzymes: No results for input(s): CKTOTAL, CKMB, CKMBINDEX, TROPONINI in the last 168 hours. BNP (last 3 results) No results for input(s): PROBNP in the last 8760 hours. HbA1C: No results for input(s): HGBA1C in the last 72 hours. CBG: No results for input(s): GLUCAP in the last 168 hours. Lipid Profile: No results for input(s): CHOL, HDL, LDLCALC, TRIG, CHOLHDL, LDLDIRECT in the last 72 hours. Thyroid Function Tests: No results for input(s): TSH, T4TOTAL, FREET4, T3FREE, THYROIDAB in the last 72 hours. Anemia Panel: No results for input(s): VITAMINB12, FOLATE, FERRITIN, TIBC, IRON, RETICCTPCT in the last 72 hours. Urine analysis:    Component Value Date/Time   COLORURINE YELLOW 09/13/2015 2330   APPEARANCEUR CLEAR 09/13/2015 2330   LABSPEC 1.015 09/13/2015 2330   PHURINE 6.5 09/13/2015 2330   GLUCOSEU NEGATIVE 09/13/2015 2330   HGBUR NEGATIVE 09/13/2015 2330   BILIRUBINUR NEGATIVE 09/13/2015 2330   KETONESUR 15* 09/13/2015 2330   PROTEINUR 100* 09/13/2015 2330   NITRITE NEGATIVE 09/13/2015 2330   LEUKOCYTESUR NEGATIVE 09/13/2015 2330   Sepsis Labs: @LABRCNTIP (procalcitonin:4,lacticidven:4)  )No results found for this or any previous visit (from the past 240 hour(s)).    Radiology Studies: No results found.   Scheduled Meds: . bacitracin  1 application Topical BID  . busPIRone  5 mg Oral TID  . divalproex  500 mg Oral BID  . enoxaparin (LOVENOX) injection  40 mg Subcutaneous Q24H  . glycopyrrolate  2 mg Oral TID  . levETIRAcetam  500 mg Oral BID  . lurasidone  60 mg Oral QHS  . metoprolol tartrate  25 mg Oral BID  . mirtazapine  15 mg Oral QHS  . QUEtiapine  50 mg Oral BID   Continuous Infusions:      Time Spent in minutes   20 minutes  Ardean Melroy D.O. on 09/20/2015 at 5:35 PM  Between 7am to 7pm - Pager - [phone]  After 7pm go to [email or web address] - password [email or web address]  And look for the night coverage person covering for me after  hours  [facility] Office  [phone]

## 2015-09-21 DIAGNOSIS — F4325 Adjustment disorder with mixed disturbance of emotions and conduct: Secondary | ICD-10-CM | POA: Diagnosis not present

## 2015-09-21 DIAGNOSIS — E860 Dehydration: Secondary | ICD-10-CM | POA: Diagnosis not present

## 2015-09-21 NOTE — Clinical Social Work Note (Signed)
Lesle ChrisDerek McDowell of [facility] [phone], did NOT come to evaluate patient as anticipated. Will likely evaluate on Monday, 7/10 at 2pm. Assigned CSW to follow up.   Derenda FennelBashira Rene Sizelove, MSW, LCSWA [phone] 09/21/2015 3:16 PM
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
## 2015-09-21 NOTE — Clinical Social Work Note (Signed)
Clinical Social Worker continues to search for group home/ALF placement.CSW has contacted the following facilities:   [facility] [phone], Jimmye NormanLawanda Ray: left detailed message for returned phone call with wknd/wkday contacts  [facility] [address] [facility] [phone], Elenor QuinonesStella Fowler: left detailed message for returned phone call with wknd/wkday contacts  [facility] [phone]: currently no beds available   [address] [phone]: asked to contact Liborio NixonJanice and/or Birdena JubileeMakia at [phone] however unable to leave voice message  [facility] attempted to call [phone]: voice mailbox remains full, unable to leave message  [facility] [phone]: spoke with Lesle Chriserek McDowell, he has reviewed referral information however currently does not believe facility is able to accommodate patient's needs. Diona BrownerMcDowell would like to contact and discuss care plans with patient's [facility] coordinator, Rhett BannisterKaren McClelland [phone] (CSW provided [place] [facility]'s contact information). Diona BrownerMcDowell stated he plans to evaluate patient. Evaluation pending for Sunday, 7/9 after 2PM or Monday, 7/10 at [facility]. [place] plans to contact this CSW is able to evaluate patient today. Diona BrownerMcDowell informed to contact assigned CSW during the week if evaluation will take place on Monday. If patient is appropriate, Diona BrownerMcDowell will initiate transitions process to group home.   [facility] [phone], Tameka: left another message for today as Tameka had planned to evaluate patient/contact CSW on Sat, 7/8 around 2PM.    At this time patient does not have any other bed offers.   [facility] for PASARR review. PASARR remains under manual review. Patient's mother has also signed consent forms.  Assigned CSW to follow up with above.   CSW will continue to follow patient and patient's family for  continued support and to facilitate patient's d/c needs once placement is secured.   Derenda FennelBashira Eissa Johnson, MSW, LCSWA [phone] 09/21/2015 10:34 AM

## 2015-09-22 DIAGNOSIS — E860 Dehydration: Secondary | ICD-10-CM | POA: Diagnosis not present

## 2015-09-22 DIAGNOSIS — F4325 Adjustment disorder with mixed disturbance of emotions and conduct: Secondary | ICD-10-CM | POA: Diagnosis not present

## 2015-09-22 NOTE — Progress Notes (Signed)
Physical Therapy Treatment Patient Details Name: Glen Johnson MRN: [id number] DOB: 02-04-1976 Today's Date: 09/22/2015    History of Present Illness pt is a 40 y/o male with h/o TBI and seiures presenting to ED with syncope and collapse.    PT Comments    Patient eager to ambulate and pleasant this session. Pt less impulsive upon standing. Pt continues to require supervision for safe ambulation and use of AD. Current plan remains appropriate.   Follow Up Recommendations  Home health PT;Other (comment) (A safety evaluation at the group home)     Equipment Recommendations  Cane;Rolling walker with 5" wheels    Recommendations for Other Services       Precautions / Restrictions Precautions Precautions: Fall Restrictions Weight Bearing Restrictions: No    Mobility  Bed Mobility Overal bed mobility: Modified Independent                Transfers Overall transfer level: Modified independent               General transfer comment: less impulsive this session; followed cues for safety   Ambulation/Gait Ambulation/Gait assistance: Supervision Ambulation Distance (Feet): 300 Feet Assistive device: Rolling walker (2 wheeled);Straight cane Gait Pattern/deviations: Step-through pattern;Decreased stride length;Trunk flexed;Wide base of support Gait velocity: frequent changes in velocity   General Gait Details: supervision for safety; cues for cadence and proximity of RW; pt requires supervision for safe use  of AD   Stairs            Wheelchair Mobility    Modified Rankin (Stroke Patients Only)       Balance     Sitting balance-Leahy Scale: Good       Standing balance-Leahy Scale: Poor                      Cognition Arousal/Alertness: Awake/alert Behavior During Therapy: WFL for tasks assessed/performed Overall Cognitive Status: History of cognitive impairments - at baseline                      Exercises General  Exercises - Lower Extremity Long Arc Quad: Strengthening;Both;20 reps;Seated Straight Leg Raises: Strengthening;Both;20 reps;Seated Hip Flexion/Marching: Strengthening;Both;20 reps;Seated    General Comments        Pertinent Vitals/Pain Pain Assessment: No/denies pain    Home Living                      Prior Function            PT Goals (current goals can now be found in the care plan section) Acute Rehab PT Goals Patient Stated Goal: none stated PT Goal Formulation: With family Time For Goal Achievement: 09/22/15 Potential to Achieve Goals: Good Progress towards PT goals: Progressing toward goals    Frequency  Min 3X/week    PT Plan Current plan remains appropriate    Co-evaluation             End of Session Equipment Utilized During Treatment: Gait belt Activity Tolerance: Patient tolerated treatment well Patient left: in chair;with chair alarm set;with call bell/phone within reach     Time: 0909-0925 PT Time Calculation (min) (ACUTE ONLY): 16 min  Charges:  $Gait Training: 8-22 mins                    G Codes:      Glen Johnson, PTA Pager: [phone]   09/22/2015, 9:29 AM
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
## 2015-09-22 NOTE — Progress Notes (Addendum)
09/23/15: CS[facility] [phone]). [facility] does not have beds.   7/10: [facility].  CSW [facility] with Francena [facility]lyStella [facility]. She stated she will review the referral and get back to me.  [facility] requested info to be faxed to [phone].   Osborne Cascoadia Aldean Suddeth LCSWA [phone]

## 2015-09-23 DIAGNOSIS — F4325 Adjustment disorder with mixed disturbance of emotions and conduct: Secondary | ICD-10-CM | POA: Diagnosis not present

## 2015-09-23 DIAGNOSIS — E860 Dehydration: Secondary | ICD-10-CM | POA: Diagnosis not present
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
## 2015-09-23 NOTE — Progress Notes (Addendum)
[facility] evaluated patient today and will discuss this evening with adminstrators- will follow up with CSW in the morning regarding possible admission  CSW called [facility] [phone]- left message regarding referral  [facility] unable to [facility] patient due to behavioral concerns  Pt mother updated on progress  Merlyn LotJenna Holoman, [facility]WA Clinical Social Worker [phone]
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
## 2015-09-24 NOTE — Progress Notes (Signed)
Seen and examined. No overnight issues and hemodynamically stable. Patient remains medically stable for discharge. Discharge summary from 09/23/15 reviewed and no changes needed. Will follow up with PCP at discharge. [facility].  Glen Johnson, Glen Johnson [phone]

## 2015-09-24 NOTE — Progress Notes (Signed)
PASRR: [id number] Claudia DesanctisK   Stephanye Finnicum LCSWA [phone]

## 2015-09-24 NOTE — Progress Notes (Signed)
[facility] has accepted patient and alerted patient's mother. Glen Johnson will come pick patient up on Thursday morning. CSW will put PASRR number and FL2 on patient's chart.  Osborne Cascoadia Avannah Decker LCSWA [phone]
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
## 2015-09-25 MED ORDER — METOPROLOL TARTRATE 25 MG PO TABS: 25.0000 mg | ORAL_TABLET | Freq: Two times a day (BID) | ORAL | Status: AC

## 2015-09-25 MED ORDER — DIVALPROEX SODIUM ER 500 MG PO TB24: 500.0000 mg | ORAL_TABLET | Freq: Two times a day (BID) | ORAL | Status: DC

## 2015-09-25 MED ORDER — LURASIDONE HCL 60 MG PO TABS: 60.0000 mg | ORAL_TABLET | Freq: Every day | ORAL | Status: DC

## 2015-09-25 MED ORDER — BUSPIRONE HCL 5 MG PO TABS: 5.0000 mg | ORAL_TABLET | Freq: Three times a day (TID) | ORAL | Status: AC

## 2015-09-25 MED ORDER — CLONAZEPAM 0.5 MG PO TABS: 0.5000 mg | ORAL_TABLET | Freq: Two times a day (BID) | ORAL | Status: AC | PRN

## 2015-09-25 MED ORDER — LEVETIRACETAM 500 MG PO TABS: 500.0000 mg | ORAL_TABLET | Freq: Two times a day (BID) | ORAL | Status: DC

## 2015-09-25 NOTE — Progress Notes (Signed)
[facility] Anticipated discharge date: 7/13 Family notified:pt mother Transportation by group home coordinator Derryl Harbor([name]) pick up for around 10am  CSW signing off.  Merlyn LotJenna Holoman, LCSWA Clinical Social Worker [phone]

## 2015-09-25 NOTE — Progress Notes (Signed)
Glen Johnson to be D/C'd to group home per MD order.  Discussed with the patient and all questions fully answered.  VSS, Skin clean, dry and intact without evidence of skin break down, no evidence of skin tears noted. IV catheter discontinued intact. Site without signs and symptoms of complications. Dressing and pressure applied.  An After Visit Summary was printed and given to the patient/group home.  Patient instructed to return to ED, call 911, or call MD for any changes in condition.   Patient escorted via WC, and D/C to group home via private auto.  [name] 09/25/2015 10:20 AM

## 2015-09-25 NOTE — Progress Notes (Signed)
Patient refused to have am labs drawn. Lab staff attempted to draw blood twice and patient refused to let her attempt the second time.

## 2015-09-25 NOTE — Progress Notes (Signed)
Patient seen and examined. In no acute distress and hemodynamically stable. Ok to discharge from medical stand point. Advise to keep himself well hydrated and to take medications as prescribed. Needs outpatient follow up to reassess renal function and have further adjustment on his medications as needed. No changes to discharge summary or medications as anticipated on 09/23/15.  Vassie LollMadera, Avery Klingbeil [phone]

## 2015-10-28 ENCOUNTER — Encounter (HOSPITAL_COMMUNITY): Payer: Self-pay | Admitting: Emergency Medicine

## 2015-10-28 ENCOUNTER — Emergency Department (HOSPITAL_COMMUNITY)
Admission: EM | Admit: 2015-10-28 | Discharge: 2015-10-29 | Disposition: A | Discharge status: Home / Self Care | Payer: Medicaid Other | Attending: Physician Assistant | Admitting: Physician Assistant

## 2015-10-28 DIAGNOSIS — Z79899 Other long term (current) drug therapy: Secondary | ICD-10-CM | POA: Insufficient documentation

## 2015-10-28 DIAGNOSIS — N182 Chronic kidney disease, stage 2 (mild): Secondary | ICD-10-CM | POA: Diagnosis not present

## 2015-10-28 DIAGNOSIS — F209 Schizophrenia, unspecified: Secondary | ICD-10-CM | POA: Diagnosis present

## 2015-10-28 DIAGNOSIS — I129 Hypertensive chronic kidney disease with stage 1 through stage 4 chronic kidney disease, or unspecified chronic kidney disease: Secondary | ICD-10-CM | POA: Diagnosis not present

## 2015-10-28 DIAGNOSIS — Z046 Encounter for general psychiatric examination, requested by authority: Secondary | ICD-10-CM | POA: Insufficient documentation

## 2015-10-28 DIAGNOSIS — F4325 Adjustment disorder with mixed disturbance of emotions and conduct: Secondary | ICD-10-CM | POA: Diagnosis present

## 2015-10-28 DIAGNOSIS — Z87891 Personal history of nicotine dependence: Secondary | ICD-10-CM | POA: Insufficient documentation

## 2015-10-28 DIAGNOSIS — F918 Other conduct disorders: Secondary | ICD-10-CM | POA: Insufficient documentation

## 2015-10-28 DIAGNOSIS — F200 Paranoid schizophrenia: Secondary | ICD-10-CM | POA: Diagnosis not present

## 2015-10-28 DIAGNOSIS — R4689 Other symptoms and signs involving appearance and behavior: Secondary | ICD-10-CM

## 2015-10-28 LAB — URINALYSIS, ROUTINE W REFLEX MICROSCOPIC
Bilirubin Urine: NEGATIVE
Glucose, UA: NEGATIVE mg/dL
Hgb urine dipstick: NEGATIVE
Ketones, ur: NEGATIVE mg/dL
Nitrite: NEGATIVE
Protein, ur: NEGATIVE mg/dL
Specific Gravity, Urine: 1.022 (ref 1.005–1.030)
pH: 6.5 (ref 5.0–8.0)

## 2015-10-28 LAB — URINE MICROSCOPIC-ADD ON

## 2015-10-28 LAB — RAPID URINE DRUG SCREEN, HOSP PERFORMED
Amphetamines: NOT DETECTED
Barbiturates: NOT DETECTED
Benzodiazepines: NOT DETECTED
COCAINE: NOT DETECTED
OPIATES: NOT DETECTED
Tetrahydrocannabinol: NOT DETECTED

## 2015-10-28 LAB — CBC
HCT: 46 % (ref 39.0–52.0)
Hemoglobin: 15.4 g/dL (ref 13.0–17.0)
MCH: 30.8 pg (ref 26.0–34.0)
MCHC: 33.5 g/dL (ref 30.0–36.0)
MCV: 92 fL (ref 78.0–100.0)
PLATELETS: 194 10*3/uL (ref 150–400)
RBC: 5 MIL/uL (ref 4.22–5.81)
RDW: 13.3 % (ref 11.5–15.5)
WBC: 5 10*3/uL (ref 4.0–10.5)

## 2015-10-28 LAB — COMPREHENSIVE METABOLIC PANEL
ALK PHOS: 44 U/L (ref 38–126)
ALT: 46 U/L (ref 17–63)
AST: 33 U/L (ref 15–41)
Albumin: 4.1 g/dL (ref 3.5–5.0)
Anion gap: 8 (ref 5–15)
BILIRUBIN TOTAL: 0.4 mg/dL (ref 0.3–1.2)
BUN: 15 mg/dL (ref 6–20)
CO2: 26 mmol/L (ref 22–32)
CREATININE: 1.61 mg/dL — AB (ref 0.61–1.24)
Calcium: 9.9 mg/dL (ref 8.9–10.3)
Chloride: 105 mmol/L (ref 101–111)
GFR calc Af Amer: >60 mL/min (ref >60)
GFR, EST NON AFRICAN AMERICAN: 52 mL/min — AB (ref >60)
Glucose, Bld: 92 mg/dL (ref 65–99)
Potassium: 4 mmol/L (ref 3.5–5.1)
Sodium: 139 mmol/L (ref 135–145)
TOTAL PROTEIN: 7.4 g/dL (ref 6.5–8.1)

## 2015-10-28 LAB — ETHANOL

## 2015-10-28 LAB — SALICYLATE LEVEL: Salicylate Lvl: <4 mg/dL (ref 2.8–30.0)

## 2015-10-28 LAB — ACETAMINOPHEN LEVEL

## 2015-10-28 MED ORDER — GLYCOPYRROLATE 1 MG PO TABS
2.0000 mg | ORAL_TABLET | Freq: Three times a day (TID) | ORAL | Status: DC
  Administered 2015-10-28 – 2015-10-29 (×3): 2 mg via ORAL
  Filled 2015-10-28 (×4): qty 2

## 2015-10-28 MED ORDER — BUSPIRONE HCL 10 MG PO TABS
5.0000 mg | ORAL_TABLET | Freq: Three times a day (TID) | ORAL | Status: DC
  Administered 2015-10-28 – 2015-10-29 (×3): 5 mg via ORAL
  Filled 2015-10-28 (×3): qty 1

## 2015-10-28 MED ORDER — CLONAZEPAM 0.5 MG PO TABS: 0.5000 mg | ORAL_TABLET | Freq: Two times a day (BID) | ORAL | Status: DC | PRN

## 2015-10-28 MED ORDER — LURASIDONE HCL 40 MG PO TABS
60.0000 mg | ORAL_TABLET | Freq: Every day | ORAL | Status: DC
  Administered 2015-10-28: 60 mg via ORAL
  Filled 2015-10-28 (×2): qty 2

## 2015-10-28 MED ORDER — MIRTAZAPINE 15 MG PO TABS
15.0000 mg | ORAL_TABLET | Freq: Every day | ORAL | Status: DC
  Administered 2015-10-28: 15 mg via ORAL
  Filled 2015-10-28: qty 1

## 2015-10-28 MED ORDER — METOPROLOL TARTRATE 25 MG PO TABS
25.0000 mg | ORAL_TABLET | Freq: Two times a day (BID) | ORAL | Status: DC
  Administered 2015-10-28 – 2015-10-29 (×2): 25 mg via ORAL
  Filled 2015-10-28 (×2): qty 1

## 2015-10-28 MED ORDER — DIVALPROEX SODIUM ER 500 MG PO TB24
500.0000 mg | ORAL_TABLET | Freq: Two times a day (BID) | ORAL | Status: DC
  Administered 2015-10-28 – 2015-10-29 (×2): 500 mg via ORAL
  Filled 2015-10-28 (×2): qty 1

## 2015-10-28 MED ORDER — AMLODIPINE BESYLATE 5 MG PO TABS
10.0000 mg | ORAL_TABLET | Freq: Every day | ORAL | Status: DC
  Administered 2015-10-28 – 2015-10-29 (×2): 10 mg via ORAL
  Filled 2015-10-28 (×2): qty 2

## 2015-10-28 MED ORDER — LEVETIRACETAM 500 MG PO TABS
500.0000 mg | ORAL_TABLET | Freq: Two times a day (BID) | ORAL | Status: DC
  Administered 2015-10-28 – 2015-10-29 (×2): 500 mg via ORAL
  Filled 2015-10-28 (×2): qty 1
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
## 2015-10-28 NOTE — ED Notes (Signed)
Guardian asking about plan of care PA notified

## 2015-10-28 NOTE — ED Notes (Signed)
TTS at bedside. 
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
## 2015-10-28 NOTE — ED Notes (Signed)
Pt wanded by security at this time  ?
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
## 2015-10-29 ENCOUNTER — Telehealth: Payer: Self-pay | Admitting: Adult Health

## 2015-10-29 DIAGNOSIS — F200 Paranoid schizophrenia: Secondary | ICD-10-CM

## 2015-10-29 DIAGNOSIS — F4325 Adjustment disorder with mixed disturbance of emotions and conduct: Secondary | ICD-10-CM

## 2015-10-29 NOTE — ED Notes (Signed)
Pt and guardian comfortable with discharge and follow up instructions. Pt declines wheelchair, escorted to waiting area by this RN. No Rx.  Pt declines discharge vitals.

## 2015-10-29 NOTE — ED Notes (Signed)
Contacted [name] regarding pick up time, states will be arriving in 5 minutes
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
## 2015-10-29 NOTE — Telephone Encounter (Signed)
Patient's mother is calling.  The patient was seen in the ER yesterday for behavioral problems. His mother says he needs to be seen ASAP for this in our office. Aundra MilletMegan has seen him for seizures and Dr. Terrace ArabiaYan has seen him for seizures and Schizophrenia. He has an appointment with Aundra MilletMegan in October for a follow up for seizures. Can Megan see him for these behavioral problems? Please call his mother and advise. She is concerned. Her number is [phone].

## 2015-10-29 NOTE — ED Notes (Signed)
Spoke with patient's mother regarding discharge and need for a ride.

## 2015-10-29 NOTE — ED Notes (Signed)
Dinner tray ordered for patient.

## 2015-10-29 NOTE — ED Notes (Signed)
Contacted patient's mother for update, [facility]  [name] and is driving to pick the patient up.

## 2015-10-29 NOTE — ED Notes (Signed)
Snack and drink given to patient. 

## 2015-10-29 NOTE — ED Notes (Signed)
Awaiting discharge paperwork

## 2015-10-29 NOTE — ED Notes (Signed)
Patient was given a snack and drink. A regular diet taken for lunch. 

## 2015-10-30 NOTE — Telephone Encounter (Signed)
I called and spoke to pcp, the receptionist only stated that only referral she see's was to us for seizures.  I was calling to see if they had referred pt to psych.  Nurse to call back.  Mother wanting appt with us to rule out other problems with brain.   I told her that CT and MRI have been done recently and he needs to see psychiatry for schizophrenia issues, and that we see for seizures.  She said that behavioral health said to see us first, so I'm running this by you.

## 2015-10-30 NOTE — Telephone Encounter (Signed)
Spoke to mother.  Pt was released yesterday.  She is stating that they told her that pt needs to see neurologist and pcp then will go to psych to continue medications???   I would discuss with Dr. Terrace ArabiaYan.  I stated that he has had MRI and CT both ok.  Has been getting his medications (schizophrenia) from ??

## 2015-10-31 NOTE — Telephone Encounter (Signed)
Spoke to mother of pt.  I relayed that we see pt for seizures due to TBI.  Other behavioral problems need to see psych.  [facility] neurology first to r/o other neurologic problems prior to seeing psych.  I told her [facility] Dr. Zannie CoveYan's nurse.  Mother keeps repeating same issue over.  Sent to Dr. Terrace ArabiaYan.

## 2015-10-31 NOTE — Telephone Encounter (Signed)
LMVM for pt that returned call.  °

## 2015-11-03 NOTE — Telephone Encounter (Signed)
Spoke with mother, she was check on time of appt day would be good for pt.  She stated that sz and behavior related and wanted appt sooner then 105-17.  I called and LMVM for since she did not pick up.

## 2015-11-04 NOTE — Telephone Encounter (Signed)
Spoke to mother.  Made appt 11-20-15 at 0930, arrive 0915.  Stating we see pt for sz.  She states we need to see pt prior to him seeing psych.

## 2015-11-04 NOTE — Telephone Encounter (Signed)
Pt's returned RN's call. She said anytime would be ok to call her

## 2015-11-10 ENCOUNTER — Encounter: Payer: Self-pay | Admitting: Emergency Medicine

## 2015-11-10 ENCOUNTER — Emergency Department
Admission: EM | Admit: 2015-11-10 | Discharge: 2015-11-11 | Disposition: A | Discharge status: Home / Self Care | Payer: Medicaid Other | Attending: Student in an Organized Health Care Education/Training Program | Admitting: Student in an Organized Health Care Education/Training Program

## 2015-11-10 DIAGNOSIS — F918 Other conduct disorders: Secondary | ICD-10-CM | POA: Diagnosis present

## 2015-11-10 DIAGNOSIS — R45850 Homicidal ideations: Secondary | ICD-10-CM | POA: Diagnosis not present

## 2015-11-10 DIAGNOSIS — Z79899 Other long term (current) drug therapy: Secondary | ICD-10-CM | POA: Insufficient documentation

## 2015-11-10 DIAGNOSIS — F309 Manic episode, unspecified: Secondary | ICD-10-CM | POA: Insufficient documentation

## 2015-11-10 DIAGNOSIS — IMO0002 Reserved for concepts with insufficient information to code with codable children: Secondary | ICD-10-CM

## 2015-11-10 DIAGNOSIS — Z87891 Personal history of nicotine dependence: Secondary | ICD-10-CM | POA: Insufficient documentation

## 2015-11-10 DIAGNOSIS — R4689 Other symptoms and signs involving appearance and behavior: Secondary | ICD-10-CM

## 2015-11-10 DIAGNOSIS — N182 Chronic kidney disease, stage 2 (mild): Secondary | ICD-10-CM | POA: Insufficient documentation

## 2015-11-10 DIAGNOSIS — I129 Hypertensive chronic kidney disease with stage 1 through stage 4 chronic kidney disease, or unspecified chronic kidney disease: Secondary | ICD-10-CM | POA: Diagnosis not present

## 2015-11-10 DIAGNOSIS — S069X0S Unspecified intracranial injury without loss of consciousness, sequela: Secondary | ICD-10-CM | POA: Diagnosis not present

## 2015-11-10 DIAGNOSIS — S069XAS Unspecified intracranial injury with loss of consciousness status unknown, sequela: Secondary | ICD-10-CM

## 2015-11-10 LAB — CBC
HCT: 45.2 % (ref 40.0–52.0)
HEMOGLOBIN: 15.9 g/dL (ref 13.0–18.0)
MCH: 31.8 pg (ref 26.0–34.0)
MCHC: 35.1 g/dL (ref 32.0–36.0)
MCV: 90.7 fL (ref 80.0–100.0)
Platelets: 193 10*3/uL (ref 150–440)
RBC: 4.99 MIL/uL (ref 4.40–5.90)
RDW: 13.8 % (ref 11.5–14.5)
WBC: 4.9 10*3/uL (ref 3.8–10.6)

## 2015-11-10 LAB — COMPREHENSIVE METABOLIC PANEL
ALBUMIN: 4.4 g/dL (ref 3.5–5.0)
ALT: 39 U/L (ref 17–63)
AST: 30 U/L (ref 15–41)
Alkaline Phosphatase: 47 U/L (ref 38–126)
Anion gap: 7 (ref 5–15)
BUN: 18 mg/dL (ref 6–20)
CHLORIDE: 107 mmol/L (ref 101–111)
CO2: 25 mmol/L (ref 22–32)
CREATININE: 1.55 mg/dL — AB (ref 0.61–1.24)
Calcium: 9.6 mg/dL (ref 8.9–10.3)
GFR calc Af Amer: >60 mL/min (ref >60)
GFR calc non Af Amer: 54 mL/min — ABNORMAL LOW (ref >60)
Glucose, Bld: 81 mg/dL (ref 65–99)
POTASSIUM: 4.2 mmol/L (ref 3.5–5.1)
SODIUM: 139 mmol/L (ref 135–145)
Total Bilirubin: 0.5 mg/dL (ref 0.3–1.2)
Total Protein: 8 g/dL (ref 6.5–8.1)

## 2015-11-10 LAB — ACETAMINOPHEN LEVEL: Acetaminophen (Tylenol), Serum: <10 ug/mL — ABNORMAL LOW (ref 10–30)

## 2015-11-10 LAB — SALICYLATE LEVEL: Salicylate Lvl: <4 mg/dL (ref 2.8–30.0)

## 2015-11-10 LAB — URINE DRUG SCREEN, QUALITATIVE (ARMC ONLY)
AMPHETAMINES, UR SCREEN: NOT DETECTED
Barbiturates, Ur Screen: NOT DETECTED
Benzodiazepine, Ur Scrn: NOT DETECTED
CANNABINOID 50 NG, UR ~~LOC~~: NOT DETECTED
COCAINE METABOLITE, UR ~~LOC~~: NOT DETECTED
MDMA (ECSTASY) UR SCREEN: NOT DETECTED
Methadone Scn, Ur: NOT DETECTED
Opiate, Ur Screen: NOT DETECTED
Phencyclidine (PCP) Ur S: NOT DETECTED
TRICYCLIC, UR SCREEN: NOT DETECTED

## 2015-11-10 LAB — ETHANOL: Alcohol, Ethyl (B): <5 mg/dL (ref <5)

## 2015-11-10 MED ORDER — BUSPIRONE HCL 10 MG PO TABS
ORAL_TABLET | ORAL | Status: AC
  Administered 2015-11-10: 5 mg via ORAL
  Filled 2015-11-10: qty 1

## 2015-11-10 MED ORDER — DIVALPROEX SODIUM 500 MG PO DR TAB
500.0000 mg | DELAYED_RELEASE_TABLET | Freq: Two times a day (BID) | ORAL | Status: DC
  Administered 2015-11-10 – 2015-11-11 (×2): 500 mg via ORAL
  Filled 2015-11-10 (×2): qty 1

## 2015-11-10 MED ORDER — ACETAMINOPHEN 325 MG PO TABS
650.0000 mg | ORAL_TABLET | Freq: Once | ORAL | Status: AC
  Administered 2015-11-10: 650 mg via ORAL

## 2015-11-10 MED ORDER — ACETAMINOPHEN 325 MG PO TABS
ORAL_TABLET | ORAL | Status: AC
  Administered 2015-11-10: 650 mg via ORAL
  Filled 2015-11-10: qty 2

## 2015-11-10 MED ORDER — LEVETIRACETAM 500 MG PO TABS
500.0000 mg | ORAL_TABLET | Freq: Two times a day (BID) | ORAL | Status: DC
  Administered 2015-11-10 – 2015-11-11 (×2): 500 mg via ORAL
  Filled 2015-11-10 (×2): qty 1

## 2015-11-10 MED ORDER — AMLODIPINE BESYLATE 5 MG PO TABS
10.0000 mg | ORAL_TABLET | Freq: Every day | ORAL | Status: DC
  Administered 2015-11-10: 10 mg via ORAL

## 2015-11-10 MED ORDER — METOPROLOL TARTRATE 25 MG PO TABS
25.0000 mg | ORAL_TABLET | Freq: Two times a day (BID) | ORAL | Status: DC
  Administered 2015-11-10: 25 mg via ORAL
  Filled 2015-11-10: qty 1

## 2015-11-10 MED ORDER — SODIUM CHLORIDE 0.9 % IV BOLUS (SEPSIS)
1000.0000 mL | Freq: Once | INTRAVENOUS | Status: AC
  Administered 2015-11-10: 1000 mL via INTRAVENOUS

## 2015-11-10 MED ORDER — LURASIDONE HCL 80 MG PO TABS
80.0000 mg | ORAL_TABLET | Freq: Every day | ORAL | Status: DC
  Administered 2015-11-10: 80 mg via ORAL
  Filled 2015-11-10 (×2): qty 1

## 2015-11-10 MED ORDER — AMLODIPINE BESYLATE 5 MG PO TABS
ORAL_TABLET | ORAL | Status: AC
  Administered 2015-11-10: 10 mg via ORAL
  Filled 2015-11-10: qty 2

## 2015-11-10 MED ORDER — BUSPIRONE HCL 10 MG PO TABS
5.0000 mg | ORAL_TABLET | Freq: Three times a day (TID) | ORAL | Status: DC
  Administered 2015-11-10 – 2015-11-11 (×3): 5 mg via ORAL
  Filled 2015-11-10 (×2): qty 1

## 2015-11-10 MED ORDER — QUETIAPINE FUMARATE 25 MG PO TABS
100.0000 mg | ORAL_TABLET | Freq: Every day | ORAL | Status: DC
  Administered 2015-11-10: 100 mg via ORAL
  Filled 2015-11-10: qty 4

## 2015-11-10 MED ORDER — CLONAZEPAM 0.5 MG PO TABS
0.5000 mg | ORAL_TABLET | Freq: Two times a day (BID) | ORAL | Status: DC
  Administered 2015-11-10 – 2015-11-11 (×2): 0.5 mg via ORAL
  Filled 2015-11-10 (×2): qty 1

## 2015-11-10 MED ORDER — MIRTAZAPINE 15 MG PO TABS
15.0000 mg | ORAL_TABLET | Freq: Every day | ORAL | Status: DC
  Administered 2015-11-10: 15 mg via ORAL
  Filled 2015-11-10: qty 1

## 2015-11-10 NOTE — ED Notes (Signed)
Pt given dinner tray.

## 2015-11-10 NOTE — ED Notes (Signed)
Behavior doctor talking with patient at this time.
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
## 2015-11-10 NOTE — Consult Note (Signed)
[place] Psychiatry Consult   Reason for Consult:  Consult for 40 year old man with a history of a traumatic brain injury brought in from his group home because of agitation and behavior problems Referring Physician:  Edd Fabian Patient Identification: Glen Johnson MRN:  [id number] Principal Diagnosis: Neurobehavioral sequelae of traumatic brain injury [facility]) Diagnosis:   Patient Active Problem List   Diagnosis Date Noted  . Neurobehavioral sequelae of traumatic brain injury ([place]) [S06.9X0S] 11/10/2015  . Dehydration [E86.0] 09/14/2015  . AKI (acute kidney injury) ([place]) [N17.9] 09/14/2015  . Lactic acidosis [E87.2] 09/14/2015  . Schizophrenia ([place]) [F20.9]   . Seizures ([place]) [R56.9] 02/18/2015  . Abnormality of gait [R26.9] 02/17/2015  . Schizophrenia, unspecified type ([place]) [F20.9]   . Involuntary commitment [Z04.6]   . Violent behavior [R45.6]   . Adjustment disorder with mixed disturbance of emotions and conduct [F43.25] 06/18/2014    Total Time spent with patient: 1 hour  Subjective:   Glen Johnson is a 40 y.o. male patient admitted with "I just want to go to [place]".  HPI:  Patient interviewed. Chart reviewed. Case reviewed with TTS and with the representative from the group home. This is a 39 year old man with a history of a traumatic brain injury several years ago. According to the group home he has been escalating for the last couple weeks as far as his agitation and insistence that he does not want to stay at the group home anymore. He has become agitated and threatening to staff and other patients. He has refused to take his medicine. Patient states that he wants to be released to go and live independently. He has a legal guardian and it is well documented that he has no ability to take care of himself. He has 0 insight into this. He refuses to accept the fact that his legal guardian means that he does not have the right to decide to live independently.  Patient simply gets more argumentative the more I tried to  redirect him. Patient apparently has been refusing medicine. No evidence that he's been abusing any substances.  Social history: Patient's mother is his legal guardian. It is documented in the chart that she is unable to take care of him and has refused to have him back home because of his aggressive behavior. He is living in a group home but refuses to accept his situation.  Medical history: History of severe traumatic brain injury. Seizure disorder. Multiple neurologic abnormalities including poor coordination and elevated tone and difficulty ambulating.  Substance abuse history: Patient can't give any history about that right now. No evidence of any recent abuse of substances.  Past Psychiatric History: Patient apparently had some problems with behavior even prior to the injury but for years now has been suffering from this traumatic brain injury rendering him unable to make any kind of reasonable decision. Multiple psychiatric medicines have been deployed to attack to control his behavior.  Risk to Self: Is patient at risk for suicide?: No Risk to Others:   Prior Inpatient Therapy:   Prior Outpatient Therapy:    Past Medical History:  Past Medical History:  Diagnosis Date  . Bipolar disorder ([place])   . Depression   . Hypertension   . Renal disorder    chronic kidney disease, stage II  . Schizophrenia ([place])   . Seizures ([place])   . TBI (traumatic brain injury) [facility])     Past Surgical History:  Procedure Laterality Date  . TRACHEOSTOMY    .  TRACHEOSTOMY CLOSURE     Family History:  Family History  Problem Relation Age of Onset  . Seizures Mother   . Migraines Mother   . Arthritis Mother    Family Psychiatric  History: No known Social History:  History  Alcohol Use No    Comment: Stopped one year ago.     History  Drug Use No    Comment: Stopped one year ago.    Social History   Social History  . Marital  status: Single    Spouse name: N/A  . Number of children: 2  . Years of education: 12   Occupational History  . Disabled    Social History Main Topics  . Smoking status: Former Research scientist (life sciences)  . Smokeless tobacco: Never Used  . Alcohol use No     Comment: Stopped one year ago.  . Drug use: No     Comment: Stopped one year ago.  Marland Kitchen Sexual activity: Not Asked   Other Topics Concern  . None   Social History Narrative   Pt lives at [facility] as of June or July 2017.      Additional Social History:    Allergies:  No Known Allergies  Labs:  Results for orders placed or performed during the hospital encounter of 11/10/15 (from the past 48 hour(s))  Comprehensive metabolic panel     Status: Abnormal   Collection Time: 11/10/15 10:49 AM  Result Value Ref Range   Sodium 139 135 - 145 mmol/L   Potassium 4.2 3.5 - 5.1 mmol/L   Chloride 107 101 - 111 mmol/L   CO2 25 22 - 32 mmol/L   Glucose, Bld 81 65 - 99 mg/dL   BUN 18 6 - 20 mg/dL   Creatinine, Ser 1.55 (H) 0.61 - 1.24 mg/dL   Calcium 9.6 8.9 - 10.3 mg/dL   Total Protein 8.0 6.5 - 8.1 g/dL   Albumin 4.4 3.5 - 5.0 g/dL   AST 30 15 - 41 U/L   ALT 39 17 - 63 U/L   Alkaline Phosphatase 47 38 - 126 U/L   Total Bilirubin 0.5 0.3 - 1.2 mg/dL   GFR calc non Af Amer 54 (L) >60 mL/min   GFR calc Af Amer >60 >60 mL/min    Comment: (NOTE) The eGFR has been calculated using the CKD EPI equation. This calculation has not been validated in all clinical situations. eGFR's persistently <60 mL/min signify possible Chronic Kidney Disease.    Anion gap 7 5 - 15  Ethanol     Status: None   Collection Time: 11/10/15 10:49 AM  Result Value Ref Range   Alcohol, Ethyl (B) <5 <5 mg/dL    Comment:        LOWEST DETECTABLE LIMIT FOR SERUM ALCOHOL IS 5 mg/dL FOR MEDICAL PURPOSES ONLY   Salicylate level     Status: None   Collection Time: 11/10/15 10:49 AM  Result Value Ref Range   Salicylate Lvl <2.7 2.8 - 30.0 mg/dL   Acetaminophen level     Status: Abnormal   Collection Time: 11/10/15 10:49 AM  Result Value Ref Range   Acetaminophen (Tylenol), Serum <10 (L) 10 - 30 ug/mL    Comment:        THERAPEUTIC CONCENTRATIONS VARY SIGNIFICANTLY. A RANGE OF 10-30 ug/mL MAY BE AN EFFECTIVE CONCENTRATION FOR MANY PATIENTS. HOWEVER, SOME ARE BEST TREATED AT CONCENTRATIONS OUTSIDE THIS RANGE. ACETAMINOPHEN CONCENTRATIONS >150 ug/mL AT 4 HOURS AFTER INGESTION AND >50 ug/mL AT  12 HOURS AFTER INGESTION ARE OFTEN ASSOCIATED WITH TOXIC REACTIONS.   cbc     Status: None   Collection Time: 11/10/15 10:49 AM  Result Value Ref Range   WBC 4.9 3.8 - 10.6 K/uL   RBC 4.99 4.40 - 5.90 MIL/uL   Hemoglobin 15.9 13.0 - 18.0 g/dL   HCT 45.2 40.0 - 52.0 %   MCV 90.7 80.0 - 100.0 fL   MCH 31.8 26.0 - 34.0 pg   MCHC 35.1 32.0 - 36.0 g/dL   RDW 13.8 11.5 - 14.5 %   Platelets 193 150 - 440 K/uL  Urine Drug Screen, Qualitative     Status: None   Collection Time: 11/10/15 10:49 AM  Result Value Ref Range   Tricyclic, Ur Screen NONE DETECTED NONE DETECTED   Amphetamines, Ur Screen NONE DETECTED NONE DETECTED   MDMA (Ecstasy)Ur Screen NONE DETECTED NONE DETECTED   Cocaine Metabolite,Ur Circle NONE DETECTED NONE DETECTED   Opiate, Ur Screen NONE DETECTED NONE DETECTED   Phencyclidine (PCP) Ur S NONE DETECTED NONE DETECTED   Cannabinoid 50 Ng, Ur [place] NONE DETECTED NONE DETECTED   Barbiturates, Ur Screen NONE DETECTED NONE DETECTED   Benzodiazepine, Ur Scrn NONE DETECTED NONE DETECTED   Methadone Scn, Ur NONE DETECTED NONE DETECTED    Comment: (NOTE) 448  Tricyclics, urine               Cutoff 1000 ng/mL 200  Amphetamines, urine             Cutoff 1000 ng/mL 300  MDMA (Ecstasy), urine           Cutoff 500 ng/mL 400  Cocaine Metabolite, urine       Cutoff 300 ng/mL 500  Opiate, urine                   Cutoff 300 ng/mL 600  Phencyclidine (PCP), urine      Cutoff 25 ng/mL 700  Cannabinoid, urine              Cutoff 50  ng/mL 800  Barbiturates, urine             Cutoff 200 ng/mL 900  Benzodiazepine, urine           Cutoff 200 ng/mL 1000 Methadone, urine                Cutoff 300 ng/mL 1100 1200 The urine drug screen provides only a preliminary, unconfirmed 1300 analytical test result and should not be used for non-medical 1400 purposes. Clinical consideration and professional judgment should 1500 be applied to any positive drug screen result due to possible 1600 interfering substances. A more specific alternate chemical method 1700 must be used in order to obtain a confirmed analytical result.  1800 Gas chromato graphy / mass spectrometry (GC/MS) is the preferred 1900 confirmatory method.     Current Facility-Administered Medications  Medication Dose Route Frequency Provider Last Rate Last Dose  . amLODipine (NORVASC) tablet 10 mg  10 mg Oral Daily Roxanne Panek T Ashawnti Tangen, MD      . busPIRone (BUSPAR) tablet 5 mg  5 mg Oral TID Gonzella Lex, MD      . clonazePAM (KLONOPIN) tablet 0.5 mg  0.5 mg Oral BID Gonzella Lex, MD      . divalproex (DEPAKOTE) DR tablet 500 mg  500 mg Oral Q12H Gonzella Lex, MD      . levETIRAcetam (KEPPRA) tablet 500 mg  500 mg Oral BID  Gonzella Lex, MD      . lurasidone (LATUDA) tablet 80 mg  80 mg Oral Q supper Gonzella Lex, MD      . metoprolol tartrate (LOPRESSOR) tablet 25 mg  25 mg Oral BID Gonzella Lex, MD      . mirtazapine (REMERON) tablet 15 mg  15 mg Oral QHS Zayven Powe T Johnjoseph Rolfe, MD      . QUEtiapine (SEROQUEL) tablet 100 mg  100 mg Oral QHS Gonzella Lex, MD       Current Outpatient Prescriptions  Medication Sig Dispense Refill  . amLODipine (NORVASC) 10 MG tablet Take 1 tablet (10 mg total) by mouth daily. 30 tablet 0  . busPIRone (BUSPAR) 5 MG tablet Take 1 tablet (5 mg total) by mouth 3 (three) times daily. 90 tablet 0  . clonazePAM (KLONOPIN) 0.5 MG tablet Take 1 tablet (0.5 mg total) by mouth 2 (two) times daily as needed (anxiety). 30 tablet 0  . divalproex  (DEPAKOTE ER) 500 MG 24 hr tablet Take 1 tablet (500 mg total) by mouth 2 (two) times daily. 60 tablet 0  . glycopyrrolate (ROBINUL) 2 MG tablet Take 1 tablet (2 mg total) by mouth 3 (three) times daily. 90 tablet 0  . levETIRAcetam (KEPPRA) 500 MG tablet Take 1 tablet (500 mg total) by mouth 2 (two) times daily. 60 tablet 0  . Lurasidone HCl 60 MG TABS Take 60 mg by mouth at bedtime. 30 tablet 0  . metoprolol tartrate (LOPRESSOR) 25 MG tablet Take 1 tablet (25 mg total) by mouth 2 (two) times daily. 60 tablet 0  . mirtazapine (REMERON) 15 MG tablet Take 1 tablet (15 mg total) by mouth at bedtime. 30 tablet 0  . QUEtiapine (SEROQUEL) 50 MG tablet Take 1 tablet (50 mg total) by mouth 2 (two) times daily. (Patient not taking: Reported on 09/13/2015) 60 tablet 0    Musculoskeletal: Strength & Muscle Tone: spastic Gait & Station: ataxic Patient leans: N/A  Psychiatric Specialty Exam: Physical Exam  Nursing note and vitals reviewed. Constitutional: He appears well-developed and well-nourished.  HENT:  Head: Normocephalic and atraumatic.  Eyes: Conjunctivae are normal. Pupils are equal, round, and reactive to light.  Neck: Normal range of motion.  Cardiovascular: Normal heart sounds.   Respiratory: Effort normal.  GI: Soft.  Musculoskeletal: Normal range of motion.  Neurological: He is alert. He exhibits abnormal muscle tone. Coordination abnormal.  Patient has diffuse abnormal tone and difficulty ambulating although as a result of chronic brain injury  Skin: Skin is warm and dry.  Psychiatric: His affect is labile and inappropriate. His speech is tangential. He is agitated and combative. Cognition and memory are impaired. He expresses impulsivity. He exhibits abnormal recent memory.    Review of Systems  Constitutional: Negative.   HENT: Negative.   Eyes: Negative.   Respiratory: Negative.   Cardiovascular: Negative.   Gastrointestinal: Negative.   Musculoskeletal: Negative.   Skin:  Negative.   Neurological: Negative.   Psychiatric/Behavioral: Positive for memory loss. Negative for depression, hallucinations, substance abuse and suicidal ideas. The patient is not nervous/anxious and does not have insomnia.     Blood pressure 128/83, pulse 72, temperature 98 F (36.7 C), temperature source Oral, resp. rate 18, SpO2 100 %.There is no height or weight on file to calculate BMI.  General Appearance: Casual  Eye Contact:  Fair  Speech:  Garbled and Pressured  Volume:  Increased  Mood:  Irritable  Affect:  Inappropriate and Labile  Thought Process:  Disorganized  Orientation:  Negative  Thought Content:  Illogical, Rumination and Tangential  Suicidal Thoughts:  No  Homicidal Thoughts:  No  Memory:  Immediate;   Fair Recent;   Poor Remote;   Poor  Judgement:  Impaired  Insight:  Lacking  Psychomotor Activity:  Restlessness  Concentration:  Concentration: Poor  Recall:  Poor  Fund of Knowledge:  Poor  Language:  Poor  Akathisia:  No  Handed:  Right  AIMS (if indicated):     Assets:  Social Support  ADL's:  Impaired  Cognition:  Impaired,  Moderate and Severe  Sleep:        Treatment Plan Summary: Medication management and Plan Patient whose main diagnosis is traumatic brain injury. Does not meet criteria for psychiatric admission. Attempted to reason with the patient or change the subject that he simply became more agitated. He became very agitated and appeared to be on the brink of violence when we suggested that he be cooperative with simply going back home. At this time we will continue his current psychiatric medicine. Monitor overnight in hopes that we can get him back home by tomorrow if possible. I am slightly increasing his Seroquel at night.  Disposition: Patient does not meet criteria for psychiatric inpatient admission.  Alethia Berthold, MD 11/10/2015 4:41 PM

## 2015-11-10 NOTE — ED Notes (Signed)
Patient in bathroom at this time.

## 2015-11-10 NOTE — ED Notes (Signed)
Lunch was given to patient 

## 2015-11-10 NOTE — ED Notes (Signed)
Patient suddenly became diaphoretic, lethargic and his speech became even more slurred than usual. RN Kuch informed.

## 2015-11-10 NOTE — ED Triage Notes (Signed)
Pt brought in by caregiver from group home stating that pt is threatening to hurt others at his group home. Pt denies any threats to others and states he does not want to stay at the group any longer and wants to move back to [place].

## 2015-11-10 NOTE — ED Provider Notes (Signed)
[facility] Emergency Department Provider Note   ____________________________________________   First MD Initiated Contact with Patient 11/10/15 1157     (approximate)  I have reviewed the triage vital signs and the nursing notes.   HISTORY  Chief Complaint Other (making threats to other at group home. )    HPI Glen Johnson is a 40 y.o. male with history of schizophrenia, TBI, seizures, hypertension and CK D presents with his group home staff member due to concerns for mania today as well as making homicidal threats at the group home, gradual onset, severe, no modifying factors, intermittent. Group home staff member reports that this is been ongoing for several days but was very severe today and they were unable to de-escalate the patient. Patient reports that he was threatening homicide because he "wants to move back to [place]". He denies any recent illness including no cough, vomiting, diarrhea, fevers or chills. He denies any chest pain or difficulty breathing.   Past Medical History:  Diagnosis Date  . Bipolar disorder (HCC)   . Depression   . Hypertension   . Renal disorder    chronic kidney disease, stage II  . Schizophrenia (HCC)   . Seizures (HCC)   . TBI (traumatic brain injury) [facility](HCC)     Patient Active Problem List   Diagnosis Date Noted  . Dehydration 09/14/2015  . AKI (acute kidney injury) (HCC) 09/14/2015  . Lactic acidosis 09/14/2015  . Schizophrenia (HCC)   . Seizures (HCC) 02/18/2015  . Abnormality of gait 02/17/2015  . Schizophrenia, unspecified type (HCC)   . Involuntary commitment   . Violent behavior   . 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 TRACHEOSTOMY    . TRACHEOSTOMY CLOSURE      Prior to Admission medications   Medication Sig Start Date End Date Taking? Authorizing Provider  amLODipine (NORVASC) 10 MG  tablet Take 1 tablet (10 mg total) by mouth daily. 09/04/15   Charm RingsJamison Y Lord, NP  busPIRone (BUSPAR) 5 MG tablet Take 1 tablet (5 mg total) by mouth 3 (three) times daily. 09/25/15   Vassie Lollarlos Madera, MD  clonazePAM (KLONOPIN) 0.5 MG tablet Take 1 tablet (0.5 mg total) by mouth 2 (two) times daily as needed (anxiety). 09/25/15   Vassie Lollarlos Madera, MD  divalproex (DEPAKOTE ER) 500 MG 24 hr tablet Take 1 tablet (500 mg total) by mouth 2 (two) times daily. 09/25/15   Vassie Lollarlos Madera, MD  glycopyrrolate (ROBINUL) 2 MG tablet Take 1 tablet (2 mg total) by mouth 3 (three) times daily. 09/04/15   Charm RingsJamison Y Lord, NP  levETIRAcetam (KEPPRA) 500 MG tablet Take 1 tablet (500 mg total) by mouth 2 (two) times daily. 09/25/15   Vassie Lollarlos Madera, MD  Lurasidone HCl 60 MG TABS Take 60 mg by mouth at bedtime. 09/25/15   Vassie Lollarlos Madera, MD  metoprolol tartrate (LOPRESSOR) 25 MG tablet Take 1 tablet (25 mg total) by mouth 2 (two) times daily. 09/25/15   Vassie Lollarlos Madera, MD  mirtazapine (REMERON) 15 MG tablet Take 1 tablet (15 mg total) by mouth at bedtime. 09/04/15   Charm RingsJamison Y Lord, NP  QUEtiapine (SEROQUEL) 50 MG tablet Take 1 tablet (50 mg total) by mouth 2 (two) times daily. Patient not taking: Reported on 09/13/2015 09/04/15   Charm RingsJamison Y Lord, NP    Allergies Review of patient's allergies indicates no known allergies.  Family History  Problem Relation Age  of Onset  . Seizures Mother   . Migraines Mother   . Arthritis Mother     Social History Social History  Substance Use Topics  . Smoking status: Former Games developer  . Smokeless tobacco: Never Used  . Alcohol use No     Comment: Stopped one year ago.    Review of Systems Constitutional: No fever/chills Eyes: No visual changes. ENT: No sore throat. Cardiovascular: Denies chest pain. Respiratory: Denies shortness of breath. Gastrointestinal: No abdominal pain.  No nausea, no vomiting.  No diarrhea.  No constipation. Genitourinary: Negative for dysuria. Musculoskeletal:  Negative for back pain. Skin: Negative for rash. Neurological: Negative for headaches, focal weakness or numbness.  10-point ROS otherwise negative.  ____________________________________________   PHYSICAL EXAM:    VITAL SIGNS: ED Triage Vitals [11/10/15 1045]  Enc Vitals Group     BP 122/84     Pulse Rate 68     Resp 18     Temp 98.7 F (37.1 C)     Temp Source Oral     SpO2 100 %     Weight      Height      Head Circumference      Peak Flow      Pain Score      Pain Loc      Pain Edu?      Excl. in GC?     Constitutional: Alert and oriented. Nontoxic- appearing and in no acute distress. Appears cognitively delayed but is pleasant, cooperative, answers questions appropriately. Eyes: Conjunctivae are normal. PERRL. EOMI. Head: Atraumatic. Nose: No congestion/rhinnorhea. Mouth/Throat: Mucous membranes are moist.  Oropharynx non-erythematous. Neck: No stridor. Supple without meningismus. Cardiovascular: Normal rate, regular rhythm. Grossly normal heart sounds.  Good peripheral circulation. Respiratory: Normal respiratory effort.  No retractions. Lungs CTAB. Gastrointestinal: Soft and nontender. No distention. No CVA tenderness. Genitourinary: Deferred Musculoskeletal: No lower extremity tenderness nor edema.  No joint effusions. Neurologic:  Normal speech and language. No gross focal neurologic deficits are appreciated. No gait instability. Skin:  Skin is warm, dry and intact. No rash noted. Psychiatric: Mood and affect are normal. Speech and behavior are normal.  ____________________________________________   LABS (all labs ordered are listed, but only abnormal results are displayed)  Labs Reviewed  COMPREHENSIVE METABOLIC PANEL - Abnormal; Notable for the following:       Result Value   Creatinine, Ser 1.55 (*)    GFR calc non Af Amer 54 (*)    All other components within normal limits  ACETAMINOPHEN LEVEL - Abnormal; Notable for the following:     Acetaminophen (Tylenol), Serum <10 (*)    All other components within normal limits  ETHANOL  SALICYLATE LEVEL  CBC  URINE DRUG SCREEN, QUALITATIVE ([facility] ONLY)   ____________________________________________  EKG  none ____________________________________________  RADIOLOGY  none ____________________________________________   PROCEDURES  Procedure(s) performed: None  Procedures  Critical Care performed: No  ____________________________________________   INITIAL IMPRESSION / ASSESSMENT AND PLAN / ED COURSE  Pertinent labs & imaging results that were available during my care of the patient were reviewed by me and considered in my medical decision making (see chart for details).  Glen Johnson is a 40 y.o. male with history of schizophrenia, TBI, seizures, hypertension and CKD presents with his group home staff member due to concerns for mania today as well as making homicidal threats at the group home. On exam he is generally well-appearing and in no acute distress, vital signs stable he is  afebrile, he has a benign physical exam in no acute medical complaints. Plan for screening labs, consult behavioral health as well as psychiatry.  ----------------------------------------- 3:55 PM on 11/10/2015 ----------------------------------------- Labs revealed mild chronic stable creatinine elevation of 1.55, the remainder of his labs are unremarkable, patient is medically cleared. Disposition per psychiatry.   Clinical Course     ____________________________________________   FINAL CLINICAL IMPRESSION(S) / ED DIAGNOSES  Final diagnoses:  Mania (HCC)  Homicidal ideation      NEW MEDICATIONS STARTED DURING THIS VISIT:  New Prescriptions   No medications on file     Note:  This document was prepared using Dragon voice recognition software and may include unintentional dictation errors.    Gayla Doss, MD 11/10/15 [phone]

## 2015-11-11 LAB — GLUCOSE, CAPILLARY: Glucose-Capillary: 85 mg/dL (ref 65–99)

## 2015-11-11 LAB — TROPONIN I

## 2015-11-11 MED ORDER — SODIUM CHLORIDE 0.9 % IV BOLUS (SEPSIS)
1000.0000 mL | Freq: Once | INTRAVENOUS | Status: AC
  Administered 2015-11-11: 1000 mL via INTRAVENOUS

## 2015-11-11 NOTE — Consult Note (Signed)
[facility] Consult   Reason for Consult:  Consult for 40 year old man with a history of a traumatic brain injury brought in from his group home because of agitation and behavior problems Referring Physician:  Edd Fabian Patient Identification: Glen Johnson MRN:  [id number] Principal Diagnosis: Neurobehavioral sequelae of traumatic brain injury [facility]) Diagnosis:   Patient Active Problem List   Diagnosis Date Noted  . Neurobehavioral sequelae of traumatic brain injury ([place]) [S06.9X0S] 11/10/2015  . Dehydration [E86.0] 09/14/2015  . AKI (acute kidney injury) ([place]) [N17.9] 09/14/2015  . Lactic acidosis [E87.2] 09/14/2015  . Schizophrenia ([place]) [F20.9]   . Seizures ([place]) [R56.9] 02/18/2015  . Abnormality of gait [R26.9] 02/17/2015  . Schizophrenia, unspecified type ([place]) [F20.9]   . Involuntary commitment [Z04.6]   . Violent behavior [R45.6]   . Adjustment disorder with mixed disturbance of emotions and conduct [F43.25] 06/18/2014    Total Time spent with patient: 20 minutes  Subjective:   Glen Johnson is a 39 y.o. male patient admitted with "I just want to go to [place]".   Follow-up evaluation for Tuesday the 29th. Patient seen. Chart reviewed. Since yesterday he has been compliant with his medication. On interview today the patient says he is feeling better. He got some rest last night. He has been eating normally. He is not hostile or aggressive today. When I suggested to him that he can go ahead and go home back to his group home and follow-up with appropriate ways to deal with his concerns he was agreeable. Denies any thoughts about hurting himself or anyone else. Main concern is that he simply get his clothing back.  HPI:  Patient interviewed. Chart reviewed. Case reviewed with TTS and with the representative from the group home. This is a 40 year old man with a history of a traumatic brain injury several years ago. According to the group home he has  been escalating for the last couple weeks as far as his agitation and insistence that he does not want to stay at the group home anymore. He has become agitated and threatening to staff and other patients. He has refused to take his medicine. Patient states that he wants to be released to go and live independently. He has a legal guardian and it is well documented that he has no ability to take care of himself. He has 0 insight into this. He refuses to accept the fact that his legal guardian means that he does not have the right to decide to live independently. Patient simply gets more argumentative the more I tried to  redirect him. Patient apparently has been refusing medicine. No evidence that he's been abusing any substances.  Social history: Patient's mother is his legal guardian. It is documented in the chart that she is unable to take care of him and has refused to have him back home because of his aggressive behavior. He is living in a group home but refuses to accept his situation.  Medical history: History of severe traumatic brain injury. Seizure disorder. Multiple neurologic abnormalities including poor coordination and elevated tone and difficulty ambulating.  Substance abuse history: Patient can't give any history about that right now. No evidence of any recent abuse of substances.  Past Psychiatric History: Patient apparently had some problems with behavior even prior to the injury but for years now has been suffering from this traumatic brain injury rendering him unable to make any kind of reasonable decision. Multiple psychiatric medicines have been deployed to attack to control his  behavior.  Risk to Self: Suicidal Ideation: No Suicidal Intent: No Is patient at risk for suicide?: No Suicidal Plan?: No Access to Means: No What has been your use of drugs/alcohol within the last 12 months?: Unknown at this time. How many times?: 0 Other Self Harm Risks: Reports of none Triggers for  Past Attempts: None known Risk to Others: Homicidal Ideation: No Thoughts of Harm to Others: Yes-Currently Present Comment - Thoughts of Harm to Others: Staff and other residents Current Homicidal Intent: No Current Homicidal Plan: No Access to Homicidal Means: No Identified Victim: No thoughts to kill someone but threatening to harm others History of harm to others?: Yes Assessment of Violence: In past 6-12 months Violent Behavior Description: Residents Does patient have access to weapons?: No Criminal Charges Pending?: No Does patient have a court date: No Prior Inpatient Therapy: Prior Inpatient Therapy: No Prior Therapy Dates: Reports of none Prior Therapy Facilty/Provider(s): Reports of none Reason for Treatment: Reports of none Prior Outpatient Therapy: Prior Outpatient Therapy: No Prior Therapy Dates: Reports of none Prior Therapy Facilty/Provider(s): Reports of none Reason for Treatment: Reports of none Does patient have an ACCT team?: No Does patient have Intensive In-House Services?  : No Does patient have [facility] services? : No Does patient have P4CC services?: No  Past Medical History:  Past Medical History:  Diagnosis Date  . Bipolar disorder ([place])   . Depression   . Hypertension   . Renal disorder    chronic kidney disease, stage II  . Schizophrenia ([place])   . Seizures ([place])   . TBI (traumatic brain injury) [facility])     Past Surgical History:  Procedure Laterality Date  . TRACHEOSTOMY    . TRACHEOSTOMY CLOSURE     Family History:  Family History  Problem Relation Age of Onset  . Seizures Mother   . Migraines Mother   . Arthritis Mother    Family Psychiatric  History: No known Social History:  History  Alcohol Use No    Comment: Stopped one year ago.     History  Drug Use No    Comment: Stopped one year ago.    Social History   Social History  . Marital status: Single    Spouse name: N/A  . Number of children: 2  . Years of education: 12    Occupational History  . Disabled    Social History Main Topics  . Smoking status: Former Research scientist (life sciences)  . Smokeless tobacco: Never Used  . Alcohol use No     Comment: Stopped one year ago.  . Drug use: No     Comment: Stopped one year ago.  [place] Sexual activity: Not Asked   Other Topics Concern  . None   Social History Narrative   Pt lives at [facility] as of June or July 2017.      Additional Social History:    Allergies:  No Known Allergies  Labs:  Results for orders placed or performed during the hospital encounter of 11/10/15 (from the past 48 hour(s))  Comprehensive metabolic panel     Status: Abnormal   Collection Time: 11/10/15 10:49 AM  Result Value Ref Range   Sodium 139 135 - 145 mmol/L   Potassium 4.2 3.5 - 5.1 mmol/L   Chloride 107 101 - 111 mmol/L   CO2 25 22 - 32 mmol/L   Glucose, Bld 81 65 - 99 mg/dL   BUN 18 6 - 20 mg/dL   Creatinine, Ser 1.55 (  H) 0.61 - 1.24 mg/dL   Calcium 9.6 8.9 - 10.3 mg/dL   Total Protein 8.0 6.5 - 8.1 g/dL   Albumin 4.4 3.5 - 5.0 g/dL   AST 30 15 - 41 U/L   ALT 39 17 - 63 U/L   Alkaline Phosphatase 47 38 - 126 U/L   Total Bilirubin 0.5 0.3 - 1.2 mg/dL   GFR calc non Af Amer 54 (L) >60 mL/min   GFR calc Af Amer >60 >60 mL/min    Comment: (NOTE) The eGFR has been calculated using the CKD EPI equation. This calculation has not been validated in all clinical situations. eGFR's persistently <60 mL/min signify possible Chronic Kidney Disease.    Anion gap 7 5 - 15  Ethanol     Status: None   Collection Time: 11/10/15 10:49 AM  Result Value Ref Range   Alcohol, Ethyl (B) <5 <5 mg/dL    Comment:        LOWEST DETECTABLE LIMIT FOR SERUM ALCOHOL IS 5 mg/dL FOR MEDICAL PURPOSES ONLY   Salicylate level     Status: None   Collection Time: 11/10/15 10:49 AM  Result Value Ref Range   Salicylate Lvl <0.2 2.8 - 30.0 mg/dL  Acetaminophen level     Status: Abnormal   Collection Time: 11/10/15 10:49 AM  Result Value Ref  Range   Acetaminophen (Tylenol), Serum <10 (L) 10 - 30 ug/mL    Comment:        THERAPEUTIC CONCENTRATIONS VARY SIGNIFICANTLY. A RANGE OF 10-30 ug/mL MAY BE AN EFFECTIVE CONCENTRATION FOR MANY PATIENTS. HOWEVER, SOME ARE BEST TREATED AT CONCENTRATIONS OUTSIDE THIS RANGE. ACETAMINOPHEN CONCENTRATIONS >150 ug/mL AT 4 HOURS AFTER INGESTION AND >50 ug/mL AT 12 HOURS AFTER INGESTION ARE OFTEN ASSOCIATED WITH TOXIC REACTIONS.   cbc     Status: None   Collection Time: 11/10/15 10:49 AM  Result Value Ref Range   WBC 4.9 3.8 - 10.6 K/uL   RBC 4.99 4.40 - 5.90 MIL/uL   Hemoglobin 15.9 13.0 - 18.0 g/dL   HCT 45.2 40.0 - 52.0 %   MCV 90.7 80.0 - 100.0 fL   MCH 31.8 26.0 - 34.0 pg   MCHC 35.1 32.0 - 36.0 g/dL   RDW 13.8 11.5 - 14.5 %   Platelets 193 150 - 440 K/uL  Urine Drug Screen, Qualitative     Status: None   Collection Time: 11/10/15 10:49 AM  Result Value Ref Range   Tricyclic, Ur Screen NONE DETECTED NONE DETECTED   Amphetamines, Ur Screen NONE DETECTED NONE DETECTED   MDMA (Ecstasy)Ur Screen NONE DETECTED NONE DETECTED   Cocaine Metabolite,Ur [place] NONE DETECTED NONE DETECTED   Opiate, Ur Screen NONE DETECTED NONE DETECTED   Phencyclidine (PCP) Ur S NONE DETECTED NONE DETECTED   Cannabinoid 50 Ng, Ur [place] NONE DETECTED NONE DETECTED   Barbiturates, Ur Screen NONE DETECTED NONE DETECTED   Benzodiazepine, Ur Scrn NONE DETECTED NONE DETECTED   Methadone Scn, Ur NONE DETECTED NONE DETECTED    Comment: (NOTE) 585  Tricyclics, urine               Cutoff 1000 ng/mL 200  Amphetamines, urine             Cutoff 1000 ng/mL 300  MDMA (Ecstasy), urine           Cutoff 500 ng/mL 400  Cocaine Metabolite, urine       Cutoff 300 ng/mL 500  Opiate, urine  Cutoff 300 ng/mL 600  Phencyclidine (PCP), urine      Cutoff 25 ng/mL 700  Cannabinoid, urine              Cutoff 50 ng/mL 800  Barbiturates, urine             Cutoff 200 ng/mL 900  Benzodiazepine, urine           Cutoff 200  ng/mL 1000 Methadone, urine                Cutoff 300 ng/mL 1100 1200 The urine drug screen provides only a preliminary, unconfirmed 1300 analytical test result and should not be used for non-medical 1400 purposes. Clinical consideration and professional judgment should 1500 be applied to any positive drug screen result due to possible 1600 interfering substances. A more specific alternate chemical method 1700 must be used in order to obtain a confirmed analytical result.  1800 Gas chromato graphy / mass spectrometry (GC/MS) is the preferred 1900 confirmatory method.   Troponin I     Status: None   Collection Time: 11/10/15 11:57 PM  Result Value Ref Range   Troponin I <0.03 <0.03 ng/mL  Glucose, capillary     Status: None   Collection Time: 11/11/15 12:10 AM  Result Value Ref Range   Glucose-Capillary 85 65 - 99 mg/dL    Current Facility-Administered Medications  Medication Dose Route Frequency Provider Last Rate Last Dose  . amLODipine (NORVASC) tablet 10 mg  10 mg Oral Daily Gonzella Lex, MD   Stopped at 11/11/15 1137  . busPIRone (BUSPAR) tablet 5 mg  5 mg Oral TID Gonzella Lex, MD   5 mg at 11/11/15 1135  . clonazePAM (KLONOPIN) tablet 0.5 mg  0.5 mg Oral BID Gonzella Lex, MD   0.5 mg at 11/11/15 1134  . divalproex (DEPAKOTE) DR tablet 500 mg  500 mg Oral Q12H Gonzella Lex, MD   500 mg at 11/11/15 1134  . levETIRAcetam (KEPPRA) tablet 500 mg  500 mg Oral BID Gonzella Lex, MD   500 mg at 11/11/15 1135  . lurasidone (LATUDA) tablet 80 mg  80 mg Oral Q supper Gonzella Lex, MD   80 mg at 11/10/15 1815  . metoprolol tartrate (LOPRESSOR) tablet 25 mg  25 mg Oral BID Gonzella Lex, MD   Stopped at 11/11/15 1137  . mirtazapine (REMERON) tablet 15 mg  15 mg Oral QHS Gonzella Lex, MD   15 mg at 11/10/15 2130  . QUEtiapine (SEROQUEL) tablet 100 mg  100 mg Oral QHS Gonzella Lex, MD   100 mg at 11/10/15 2129   Current Outpatient Prescriptions  Medication Sig Dispense  Refill  . amLODipine (NORVASC) 10 MG tablet Take 1 tablet (10 mg total) by mouth daily. 30 tablet 0  . busPIRone (BUSPAR) 5 MG tablet Take 1 tablet (5 mg total) by mouth 3 (three) times daily. 90 tablet 0  . clonazePAM (KLONOPIN) 0.5 MG tablet Take 1 tablet (0.5 mg total) by mouth 2 (two) times daily as needed (anxiety). 30 tablet 0  . divalproex (DEPAKOTE ER) 500 MG 24 hr tablet Take 1 tablet (500 mg total) by mouth 2 (two) times daily. 60 tablet 0  . glycopyrrolate (ROBINUL) 2 MG tablet Take 1 tablet (2 mg total) by mouth 3 (three) times daily. 90 tablet 0  . levETIRAcetam (KEPPRA) 500 MG tablet Take 1 tablet (500 mg total) by mouth 2 (two) times daily. 60 tablet 0  .  Lurasidone HCl 60 MG TABS Take 60 mg by mouth at bedtime. 30 tablet 0  . metoprolol tartrate (LOPRESSOR) 25 MG tablet Take 1 tablet (25 mg total) by mouth 2 (two) times daily. 60 tablet 0  . mirtazapine (REMERON) 15 MG tablet Take 1 tablet (15 mg total) by mouth at bedtime. 30 tablet 0    Musculoskeletal: Strength & Muscle Tone: spastic Gait & Station: ataxic Patient leans: N/A  Psychiatric Specialty Exam: Physical Exam  Nursing note and vitals reviewed. Constitutional: He appears well-developed and well-nourished.  HENT:  Head: Normocephalic and atraumatic.  Eyes: Conjunctivae are normal. Pupils are equal, round, and reactive to light.  Neck: Normal range of motion.  Cardiovascular: Normal heart sounds.   Respiratory: Effort normal.  GI: Soft.  Musculoskeletal: Normal range of motion.  Neurological: He is alert. He exhibits abnormal muscle tone. Coordination abnormal.  Patient has diffuse abnormal tone and difficulty ambulating although as a result of chronic brain injury  Skin: Skin is warm and dry.  Psychiatric: His affect is labile and inappropriate. His speech is tangential. He is agitated and combative. Cognition and memory are impaired. He expresses impulsivity. He exhibits abnormal recent memory.    Review of  Systems  Constitutional: Negative.   HENT: Negative.   Eyes: Negative.   Respiratory: Negative.   Cardiovascular: Negative.   Gastrointestinal: Negative.   Musculoskeletal: Negative.   Skin: Negative.   Neurological: Negative.   Psychiatric/Behavioral: Positive for memory loss. Negative for depression, hallucinations, substance abuse and suicidal ideas. The patient is not nervous/anxious and does not have insomnia.     Blood pressure 115/78, pulse 67, temperature 97.5 F (36.4 C), temperature source Oral, resp. rate 16, SpO2 100 %.There is no height or weight on file to calculate BMI.  General Appearance: Casual  Eye Contact:  Fair  Speech:  Garbled and Normal Rate  Volume:  Increased  Mood:  Euthymic  Affect:  Inappropriate and Labile  Thought Process:  Disorganized  Orientation:  Negative  Thought Content:  Illogical  Suicidal Thoughts:  No  Homicidal Thoughts:  No  Memory:  Immediate;   Fair Recent;   Poor Remote;   Poor  Judgement:  Impaired  Insight:  Lacking  Psychomotor Activity:  Normal  Concentration:  Concentration: Poor  Recall:  Poor  Fund of Knowledge:  Poor  Language:  Poor  Akathisia:  No  Handed:  Right  AIMS (if indicated):     Assets:  Social Support  ADL's:  Impaired  Cognition:  Impaired,  Moderate and Severe  Sleep:        Treatment Plan Summary: Medication management and Plan Compared to how he was behaving yesterday he is doing much better today. He was not argumentative for appearing to be angry or hostile. He was immediately agreeable to my suggestion that he go back home. Case reviewed with emergency room physician. Patient is not in need of hospital level treatment. Continue current medicine. He can be discharged back home and will follow-up with his outpatient provider as soon as we can get someone from his group home here to pick him up. Case reviewed with TTS.  Disposition: Patient does not meet criteria for psychiatric inpatient  admission.  Alethia Berthold, MD 11/11/2015 12:01 PM

## 2015-11-11 NOTE — ED Notes (Signed)
Pt left with caretaker Arlys JohnLonnie Graves

## 2015-11-11 NOTE — ED Notes (Signed)
Pt IV was removed per discharge policy . Catheter in tact. Skin in tact, color appropriate, skin dry, no redness. Swelling or tenderness noted during removal/ RN notified

## 2015-11-11 NOTE — BH Assessment (Signed)
Writer called Group Home ([facility] [phone]) and informed him the patient is being discharged. Patient told the Psychiatrist he wanted to return to the Group Home. Writer spoke with him as well and he stated the same thing. He denies SI/HI and AV/H. Group Home was okay with him returning. However, due to appointments he have with other residents, he will not be able to pick him up until after 4pm.  Writer informed patient's nurse Isaiah Blakes(Jerrie, RN).

## 2015-11-11 NOTE — ED Notes (Signed)
Drystan Reader RN helped the Pt urinate on a urinal. Pt voided and went back to sleep.

## 2015-11-11 NOTE — ED Provider Notes (Signed)
-----------------------------------------   12:14 PM on 11/11/2015 -----------------------------------------  Dr. Toni Amendlapacs of psychiatry has evaluated the patient, recommends discharge with  follow[facility] at [facility]. Blood pressure 115/70 at the time of discharge.   Gayla DossEryka A Trinidee Schrag, MD 11/11/15 1215

## 2015-11-11 NOTE — ED Notes (Signed)
Pt discharged to group home after verbalizing understanding of discharge instructions; nad noted. 

## 2015-11-11 NOTE — ED Provider Notes (Signed)
-----------------------------------------   6:35 AM on 11/11/2015 -----------------------------------------   Blood pressure 98/69, pulse (!) 58, temperature 98.2 F (36.8 C), temperature source Oral, resp. rate 17, SpO2 100 %.  Patient's blood pressure dipped prior to my shift. This was thought to be secondary to multiple medications ordered which were administered at the same time. These medicines included metoprolol, Seroquel, Depakote, Latuda, Norvasc, BuSpar. Also, these may have been given in addition to other medications administered at the group home prior to patient's arrival. Patient received IV fluids and his blood pressure has normalized. Calm and cooperative at this time.  Disposition is pending Psychiatry/Behavioral Medicine team recommendations.     Irean HongJade J Sung, MD 11/11/15 [phone]

## 2015-11-20 ENCOUNTER — Ambulatory Visit (INDEPENDENT_AMBULATORY_CARE_PROVIDER_SITE_OTHER): Payer: Medicaid Other | Admitting: Adult Health

## 2015-11-20 ENCOUNTER — Encounter: Payer: Self-pay | Admitting: Adult Health

## 2015-11-20 VITALS — BP 107/73 | HR 66 | Resp 18 | Ht 69.0 in | Wt 192.0 lb

## 2015-11-20 DIAGNOSIS — Z87820 Personal history of traumatic brain injury: Secondary | ICD-10-CM

## 2015-11-20 DIAGNOSIS — F209 Schizophrenia, unspecified: Secondary | ICD-10-CM | POA: Diagnosis not present

## 2015-11-20 DIAGNOSIS — Z5181 Encounter for therapeutic drug level monitoring: Secondary | ICD-10-CM | POA: Diagnosis not present

## 2015-11-20 DIAGNOSIS — R569 Unspecified convulsions: Secondary | ICD-10-CM | POA: Diagnosis not present

## 2015-11-20 NOTE — Progress Notes (Signed)
PATIENT: Glen Johnson DOB: 16-Sep-1975  REASON FOR VISIT: follow up- traumatic brain injury, schizophrenia, seizures HISTORY FROM: patient  HISTORY OF PRESENT ILLNESS: Today 11/20/2015: Glen Johnson is a 40 year old male with a history of traumatic brain injury, schizophrenia and seizures. He returns today for follow-up. The patient's mom and caregiver is with him today. She reports that he was in the hospital in July for his behavior and again in August. At the July visit they were instructed to follow-up with primary care and his neurologist before he could see a psychiatrist? The patient denies any seizure events. He is currently taking Depakote and Keppra. The patient's mother states that he will have weeks where his behavior is controlled and other weeks his behavior cannot be controlled. The patient is on several psychiatric medications. According to the mom since he was a child he was diagnosed with psychiatric illness and after history of chronic brain injury the symptoms seem to worsen. Unclear who is currently managing his medications. He has not followed with a psychiatrist. He returns today for an evaluation.  HISTORY: Glen Johnson is 40 yo RH with his mother, seen in refer by his primary care physician Dr.Edwin Concepcion Elk, MD in Dec 30 2014 for evaluation of seizure.  He had a history of traumatic brain injury in 2012, was treated at [facility], stating hospital for 2 and half months, required tracheostomy, PEG tube, prolonged rehabilitation, had slurred speech, gait difficulty ever since.  He had long-standing history of schizophrenia, often stayed away from his family, was found on the street, ever since the brain trauma, he seems to have more spells of seizure-like activity, sudden loss of consciousness, patient was not able to provide detailed history, he is amnestic of his traumatic brain injury event, he now lives with his mother now,  reported his anger control problem, last seizure was in August 2016,   He is now on polypharmacy treatment, including Latuda 80 mg, seroquel 50 mg twice a day, Remeron 15 mg at bedtime, clonazepam, also Depakote ER 500 mg twice a day, Keppra 500 mg twice a day, BuSpar 5 mg daily  He is tolerating his medications, overall fairly stable, I have reviewed his most recent psychiatric visit, carried a diagnosis of schizophrenia, violent behavior, Adjustment disorder with mixed disturbance of emotions and conduct  UPDATE Dec 5th 2016: I have personally reviewed MRI brain w/wo in Nov 2016, that was normal. He fell in Nov 2016 from ground level, no injury. No recurrent seizure, He is now taking Depakote ER 500mg  bid, keppra 500mg  bid. He forgets to take his medication sometimes.   He had gradual worsening gait difficulty since 2014, wide based unsteady gait. Mother report family history of disabilitating arthritis. Has no bowel and bladder incontinence   UPDATE 08/18/15 (MM):  Glen Johnson is a 40 year old male with a history of seizures and schizophrenia. He returns today for follow-up. His mother is with him today. She reports that he had a seizure in March. The patient was taken to the emergency room. At the emergency room he reported that he had not been compliant with his medication. His Depakote level was low. The patient does have anger outbursts that his mother cannot always control. She states that getting him to complete ADLs can sometimes be a "struggle." He does not operate a motor vehicle. He is currently living with his mother however in the past he has been in several group homes but each time has been  evicted. He returns today for an evaluation.  REVIEW OF SYSTEMS: Out of a complete 14 system review of symptoms, the patient complains only of the following symptoms, and all other reviewed systems are negative.  ALLERGIES: No Known Allergies  HOME MEDICATIONS: Outpatient Medications  Prior to Visit  Medication Sig Dispense Refill  . amLODipine (NORVASC) 10 MG tablet Take 1 tablet (10 mg total) by mouth daily. 30 tablet 0  . busPIRone (BUSPAR) 5 MG tablet Take 1 tablet (5 mg total) by mouth 3 (three) times daily. 90 tablet 0  . clonazePAM (KLONOPIN) 0.5 MG tablet Take 1 tablet (0.5 mg total) by mouth 2 (two) times daily as needed (anxiety). 30 tablet 0  . divalproex (DEPAKOTE ER) 500 MG 24 hr tablet Take 1 tablet (500 mg total) by mouth 2 (two) times daily. 60 tablet 0  . glycopyrrolate (ROBINUL) 2 MG tablet Take 1 tablet (2 mg total) by mouth 3 (three) times daily. 90 tablet 0  . levETIRAcetam (KEPPRA) 500 MG tablet Take 1 tablet (500 mg total) by mouth 2 (two) times daily. 60 tablet 0  . Lurasidone HCl 60 MG TABS Take 60 mg by mouth at bedtime. 30 tablet 0  . metoprolol tartrate (LOPRESSOR) 25 MG tablet Take 1 tablet (25 mg total) by mouth 2 (two) times daily. 60 tablet 0  . mirtazapine (REMERON) 15 MG tablet Take 1 tablet (15 mg total) by mouth at bedtime. 30 tablet 0   No facility-administered medications prior to visit.     PAST MEDICAL HISTORY: Past Medical History:  Diagnosis Date  . Bipolar disorder (HCC)   . Depression   . Hypertension   . Renal disorder    chronic kidney disease, stage II  . Schizophrenia (HCC)   . Seizures (HCC)   . TBI (traumatic brain injury) (HCC)     PAST SURGICAL HISTORY: Past Surgical History:  Procedure Laterality Date  . TRACHEOSTOMY    . TRACHEOSTOMY CLOSURE      FAMILY HISTORY: Family History  Problem Relation Age of Onset  . Seizures Mother   . Migraines Mother   . Arthritis Mother     SOCIAL HISTORY: Social History   Social History  . Marital status: Single    Spouse name: N/A  . Number of children: 2  . Years of education: 12   Occupational History  . Disabled    Social History Main Topics  . Smoking status: Former Games developer  . Smokeless tobacco: Never Used  . Alcohol use No     Comment: Stopped one  year ago.  . Drug use: No     Comment: Stopped one year ago.  Marland Kitchen Sexual activity: Not on file   Other Topics Concern  . Not on file   Social History Narrative   Pt lives at [facility] as of June or July 2017.         PHYSICAL EXAM  Vitals:   11/20/15 0938  BP: 107/73  Pulse: 66  Resp: 18  Weight: 192 lb (87.1 kg)  Height: 5\' 9"  (1.753 m)   Body mass index is 28.35 kg/m.  Generalized: Well developed, in no acute distress   Neurological examination  Mentation: Alert oriented to time, place, history taking. Follows all commands speech and language fluent Cranial nerve II-XII: Pupils were equal round reactive to light. Extraocular movements were full, visual field were full on confrontational test. Facial sensation and strength were normal. Uvula tongue midline. Head turning and shoulder shrug  were normal and symmetric. Motor: The motor testing reveals 5 over 5 strength of all 4 extremities. Good symmetric motor tone is noted throughout.  Sensory: Sensory testing is intact to soft touch on all 4 extremities. No evidence of extinction is noted.  Coordination: Cerebellar testing reveals good finger-nose-finger and heel-to-shin bilaterally.  Gait and station: Gait is normal. Tandem gait is Unsteady. Reflexes: Deep tendon reflexes are symmetric and normal bilaterally.   DIAGNOSTIC DATA (LABS, IMAGING, TESTING) - I reviewed patient records, labs, notes, testing and imaging myself where available.  Lab Results  Component Value Date   WBC 4.9 11/10/2015   HGB 15.9 11/10/2015   HCT 45.2 11/10/2015   MCV 90.7 11/10/2015   PLT 193 11/10/2015      Component Value Date/Time   NA 139 11/10/2015 1049   K 4.2 11/10/2015 1049   CL 107 11/10/2015 1049   CO2 25 11/10/2015 1049   GLUCOSE 81 11/10/2015 1049   BUN 18 11/10/2015 1049   CREATININE 1.55 (H) 11/10/2015 1049   CALCIUM 9.6 11/10/2015 1049   PROT 8.0 11/10/2015 1049   ALBUMIN 4.4 11/10/2015 1049   AST 30  11/10/2015 1049   ALT 39 11/10/2015 1049   ALKPHOS 47 11/10/2015 1049   BILITOT 0.5 11/10/2015 1049   GFRNONAA 54 (L) 11/10/2015 1049   GFRAA >60 11/10/2015 1049      ASSESSMENT AND PLAN 40 y.o. year old male  has a past medical history of Bipolar disorder (HCC); Depression; Hypertension; Renal disorder; Schizophrenia (HCC); Seizures (HCC); and TBI (traumatic brain injury) (HCC). here with:  1. Seizures 2. History of traumatic brain injury 3. Schizophrenia  The patient has not had any seizure events. He will continue on Depakote and Keppra. I will check drug levels today. His physical exam is consistent with previous exams. The patient is encouraged to follow up with a psychiatrist. He will follow-up with Dr. Terrace ArabiaYan in 6 months or sooner if needed.     Butch PennyMegan Havannah Streat, MSN, NP-C 11/20/2015, 8:31 AM [facility] [address], [address] [place], [place] [id number] [phone]

## 2015-11-20 NOTE — Patient Instructions (Signed)
Continue depakote and keppra Blood work today If your symptoms worsen or you develop new symptoms please let us know.

## 2015-11-21 NOTE — Progress Notes (Signed)
I have reviewed and agreed above plan. 

## 2015-11-23 LAB — VALPROIC ACID LEVEL: VALPROIC ACID LVL: 82 ug/mL (ref 50–100)

## 2015-11-23 LAB — LEVETIRACETAM LEVEL: LEVETIRACETAM: 15.9 ug/mL (ref 10.0–40.0)

## 2015-11-24 ENCOUNTER — Telehealth: Payer: Self-pay

## 2015-11-24 NOTE — Telephone Encounter (Signed)
I spoke to caretaker and he is aware of results below.

## 2015-11-24 NOTE — Telephone Encounter (Signed)
-----   Message from Butch PennyMegan Millikan, NP sent at 11/24/2015  7:31 AM EDT ----- Lab work unremarkable. Please call patient

## 2015-12-18 ENCOUNTER — Ambulatory Visit: Payer: Medicaid Other | Admitting: Adult Health

## 2016-05-19 ENCOUNTER — Telehealth: Payer: Self-pay | Admitting: *Deleted

## 2016-05-19 ENCOUNTER — Ambulatory Visit: Payer: Medicaid Other | Admitting: Neurology

## 2016-05-19 NOTE — Telephone Encounter (Signed)
No showed follow up appointment. 

## 2016-05-20 ENCOUNTER — Encounter: Payer: Self-pay | Admitting: Neurology

## 2016-05-27 ENCOUNTER — Encounter: Payer: Self-pay | Admitting: Neurology

## 2016-05-27 ENCOUNTER — Ambulatory Visit (INDEPENDENT_AMBULATORY_CARE_PROVIDER_SITE_OTHER): Payer: Medicaid Other | Admitting: Neurology

## 2016-05-27 VITALS — BP 115/78 | HR 65 | Ht 69.0 in | Wt 204.0 lb

## 2016-05-27 DIAGNOSIS — R569 Unspecified convulsions: Secondary | ICD-10-CM

## 2016-05-27 DIAGNOSIS — R269 Unspecified abnormalities of gait and mobility: Secondary | ICD-10-CM

## 2016-05-27 DIAGNOSIS — S069X0S Unspecified intracranial injury without loss of consciousness, sequela: Secondary | ICD-10-CM

## 2016-05-27 MED ORDER — LEVETIRACETAM 500 MG PO TABS: 500.0000 mg | ORAL_TABLET | Freq: Two times a day (BID) | ORAL | 4 refills | Status: DC

## 2016-05-27 MED ORDER — DIVALPROEX SODIUM ER 500 MG PO TB24: 500.0000 mg | ORAL_TABLET | Freq: Two times a day (BID) | ORAL | 4 refills | Status: DC

## 2016-05-27 NOTE — Progress Notes (Signed)
PATIENT: Glen Johnson DOB: 1975/04/26  REASON FOR VISIT: follow up- traumatic brain injury, schizophrenia, seizures HISTORY FROM: patient  HISTORY OF PRESENT ILLNESS:   HISTORY: Glen Johnson is 41 yo RH with his mother, seen in refer by his primary care physician Dr.Edwin Concepcion Elk, MD in Dec 30 2014 for evaluation of seizure.  He had a history of traumatic brain injury in 2012, was treated at [facility], stayed in  hospital for 2 and half months, required tracheostomy, PEG tube, prolonged rehabilitation, had slurred speech, gait difficulty ever since.  He had long-standing history of schizophrenia, often stayed away from his family, was found on the street, ever since the brain trauma, he seems to have more spells of seizure-like activity, sudden loss of consciousness, patient was not able to provide detailed history, he is amnestic of his traumatic brain injury event, he now lives with his mother now, reported his anger control problem, last seizure was in August 2016.  He is now on polypharmacy treatment, including Latuda 80 mg, seroquel 50 mg twice a day, Remeron 15 mg at bedtime, clonazepam, also Depakote ER 500 mg twice a day, Keppra 500 mg twice a day, BuSpar 5 mg daily  He is tolerating his medications, overall fairly stable, I have reviewed his most recent psychiatric visit, carried a diagnosis of schizophrenia, violent behavior, Adjustment disorder with mixed disturbance of emotions and conduct  UPDATE Dec 5th 2016: I have personally reviewed MRI brain w/wo in Nov 2016, that was normal. He fell in Nov 2016 from ground level, no injury. No recurrent seizure, He is now taking Depakote ER 500mg  bid, keppra 500mg  bid. He forgets to take his medication sometimes.   He had gradual worsening gait difficulty since 2014, wide based unsteady gait. Mother report family history of disabilitating arthritis. Has no bowel and bladder  incontinence  Update May 27 2016, He is accompanied by current guardian [name] at today's visit, he has been at current group home since May 11 2016,  He had no recurrent seizure, currently taking Keppra 500 mg twice a day, Depakote ER 500 mg twice a day, there was no reported anger control problem.  REVIEW OF SYSTEMS: Out of a complete 14 system review of symptoms, the patient complains only of the following symptoms, and all other reviewed systems are negative.  ALLERGIES: No Known Allergies  HOME MEDICATIONS: Outpatient Medications Prior to Visit  Medication Sig Dispense Refill  . amLODipine (NORVASC) 10 MG tablet Take 1 tablet (10 mg total) by mouth daily. 30 tablet 0  . busPIRone (BUSPAR) 5 MG tablet Take 1 tablet (5 mg total) by mouth 3 (three) times daily. (Patient taking differently: Take 10 mg by mouth 3 (three) times daily. ) 90 tablet 0  . clonazePAM (KLONOPIN) 0.5 MG tablet Take 1 tablet (0.5 mg total) by mouth 2 (two) times daily as needed (anxiety). 30 tablet 0  . divalproex (DEPAKOTE ER) 500 MG 24 hr tablet Take 1 tablet (500 mg total) by mouth 2 (two) times daily. 60 tablet 0  . glycopyrrolate (ROBINUL) 2 MG tablet Take 1 tablet (2 mg total) by mouth 3 (three) times daily. 90 tablet 0  . levETIRAcetam (KEPPRA) 500 MG tablet Take 1 tablet (500 mg total) by mouth 2 (two) times daily. 60 tablet 0  . Lurasidone HCl 60 MG TABS Take 60 mg by mouth at bedtime. 30 tablet 0  . metoprolol tartrate (LOPRESSOR) 25 MG tablet Take 1 tablet (25 mg  total) by mouth 2 (two) times daily. 60 tablet 0  . mirtazapine (REMERON) 15 MG tablet Take 1 tablet (15 mg total) by mouth at bedtime. 30 tablet 0   No facility-administered medications prior to visit.     PAST MEDICAL HISTORY: Past Medical History:  Diagnosis Date  . Bipolar disorder (HCC)   . Depression   . Hypertension   . Renal disorder    chronic kidney disease, stage II  . Schizophrenia (HCC)   . Seizures (HCC)   .  TBI (traumatic brain injury) (HCC)     PAST SURGICAL HISTORY: Past Surgical History:  Procedure Laterality Date  . TRACHEOSTOMY    . TRACHEOSTOMY CLOSURE      FAMILY HISTORY: Family History  Problem Relation Age of Onset  . Seizures Mother   . Migraines Mother   . Arthritis Mother     SOCIAL HISTORY: Social History   Social History  . Marital status: Single    Spouse name: N/A  . Number of children: 2  . Years of education: 12   Occupational History  . Disabled    Social History Main Topics  . Smoking status: Current Some Day Smoker  . Smokeless tobacco: Never Used  . Alcohol use No     Comment: Stopped one year ago.  . Drug use: No     Comment: Stopped one year ago.  Marland Kitchen. Sexual activity: Not on file   Other Topics Concern  . Not on file   Social History Narrative   [facility] of June [facility] July [facility].         PHYSICAL EXAM  Vitals:   05/27/16 1203  BP: 115/78  Pulse: 65  Weight: 204 lb (92.5 kg)  Height: 5\' 9"  (1.753 m)   Body mass index is 30.13 kg/m.   PHYSICAL EXAMNIATION:  Gen: NAD, conversant, well nourised, obese, well groomed                     Cardiovascular: Regular rate rhythm, no peripheral edema, warm, nontender. Eyes: Conjunctivae clear without exudates or hemorrhage Neck: Supple, no carotid bruits. Pulmonary: Clear to auscultation bilaterally   NEUROLOGICAL EXAM:  MENTAL STATUS: Speech/cognition: He has mild slurred speech, cooperative on examinations,   CRANIAL NERVES: CN II: Visual fields are full to confrontation. Fundoscopic exam is normal with sharp discs and no vascular changes. Pupils are round equal and briskly reactive to light. CN III, IV, VI: extraocular movement are normal. No ptosis. CN V: Facial sensation is intact to pinprick in all 3 divisions bilaterally. Corneal responses are intact.  CN VII: Face is symmetric with normal eye closure and smile. CN VIII: Hearing is normal to  rubbing fingers CN IX, X: Palate elevates symmetrically. Phonation is normal. CN XI: Head turning and shoulder shrug are intact CN XII: Tongue is midline with normal movements and no atrophy.  MOTOR: He has mild bilateral lower extremity rigidity, mild spastic left hemiparesis, upper and lower extremity motor strength is 4 plus/5.  REFLEXES: Reflexes are 2+ and symmetric at the biceps, triceps, knees, and ankles. Plantar responses are flexor.  SENSORY: Intact to light touch, pinprick, positional and vibratory sensation are intact in fingers and toes.  COORDINATION: Rapid alternating movements and fine finger movements are intact. There is no dysmetria on finger-to-nose and heel-knee-shin.    GAIT/STANCE: He needs push up to get up from seated position, wide based, cautious,  unsteady stiff,   DIAGNOSTIC  DATA (LABS, IMAGING, TESTING) - I reviewed patient records, labs, notes, testing and imaging myself where available.  Lab Results  Component Value Date   WBC 4.9 11/10/2015   HGB 15.9 11/10/2015   HCT 45.2 11/10/2015   MCV 90.7 11/10/2015   PLT 193 11/10/2015      Component Value Date/Time   NA 139 11/10/2015 1049   K 4.2 11/10/2015 1049   CL 107 11/10/2015 1049   CO2 25 11/10/2015 1049   GLUCOSE 81 11/10/2015 1049   BUN 18 11/10/2015 1049   CREATININE 1.55 (H) 11/10/2015 1049   CALCIUM 9.6 11/10/2015 1049   PROT 8.0 11/10/2015 1049   ALBUMIN 4.4 11/10/2015 1049   AST 30 11/10/2015 1049   ALT 39 11/10/2015 1049   ALKPHOS 47 11/10/2015 1049   BILITOT 0.5 11/10/2015 1049   GFRNONAA 54 (L) 11/10/2015 1049   GFRAA >60 11/10/2015 1049      ASSESSMENT AND PLAN 41 y.o. year old male  History of traumatic brain injury Epilepsy Schizophrenia  He is doing well on current Keppra 500 mg twice a day  Depakote ER 500 mg twice a day  Refill his medications return to clinic in one year  Levert Feinstein, M.D. Ph.D.  [facility] [address] [place],  [place] [address] Phone: [phone] Fax:      [phone]

## 2016-09-16 ENCOUNTER — Other Ambulatory Visit: Payer: Self-pay | Admitting: Neurology

## 2016-11-24 ENCOUNTER — Other Ambulatory Visit: Payer: Self-pay | Admitting: Neurology

## 2016-12-07 ENCOUNTER — Telehealth: Payer: Self-pay | Admitting: Neurology

## 2016-12-07 NOTE — Telephone Encounter (Signed)
Spoke to [name] - states his psychiatrist requested repeat MRI brain but she was unsure of the reason - said she thought it was related to his psych meds.  He is seen here yearly for seizures - last appt 05/2016.  Dr. [name] would need to evaluate him early to consider MRI.  She also stated this can be discussed with his PCP.  [name] said the patient has an appt with his PCP on 12/08/16 and she will talk with him.  They will call us back if his appt needs to be moved up here.

## 2016-12-07 NOTE — Telephone Encounter (Signed)
[name] administratior at assisted living where patient resides is calling. Patient saw his psychiatrist and was told he needs to have an MRI and our office would need to call and set that up. Please call and discuss.

## 2017-01-05 IMAGING — DX DG CHEST 2V
2 series · 2 of 2 positions shown · non-contrast
Comparison: 05/29/2014

CLINICAL DATA: Fell today.  Altered mental status.

EXAM:
CHEST  2 VIEW

[chest lat]
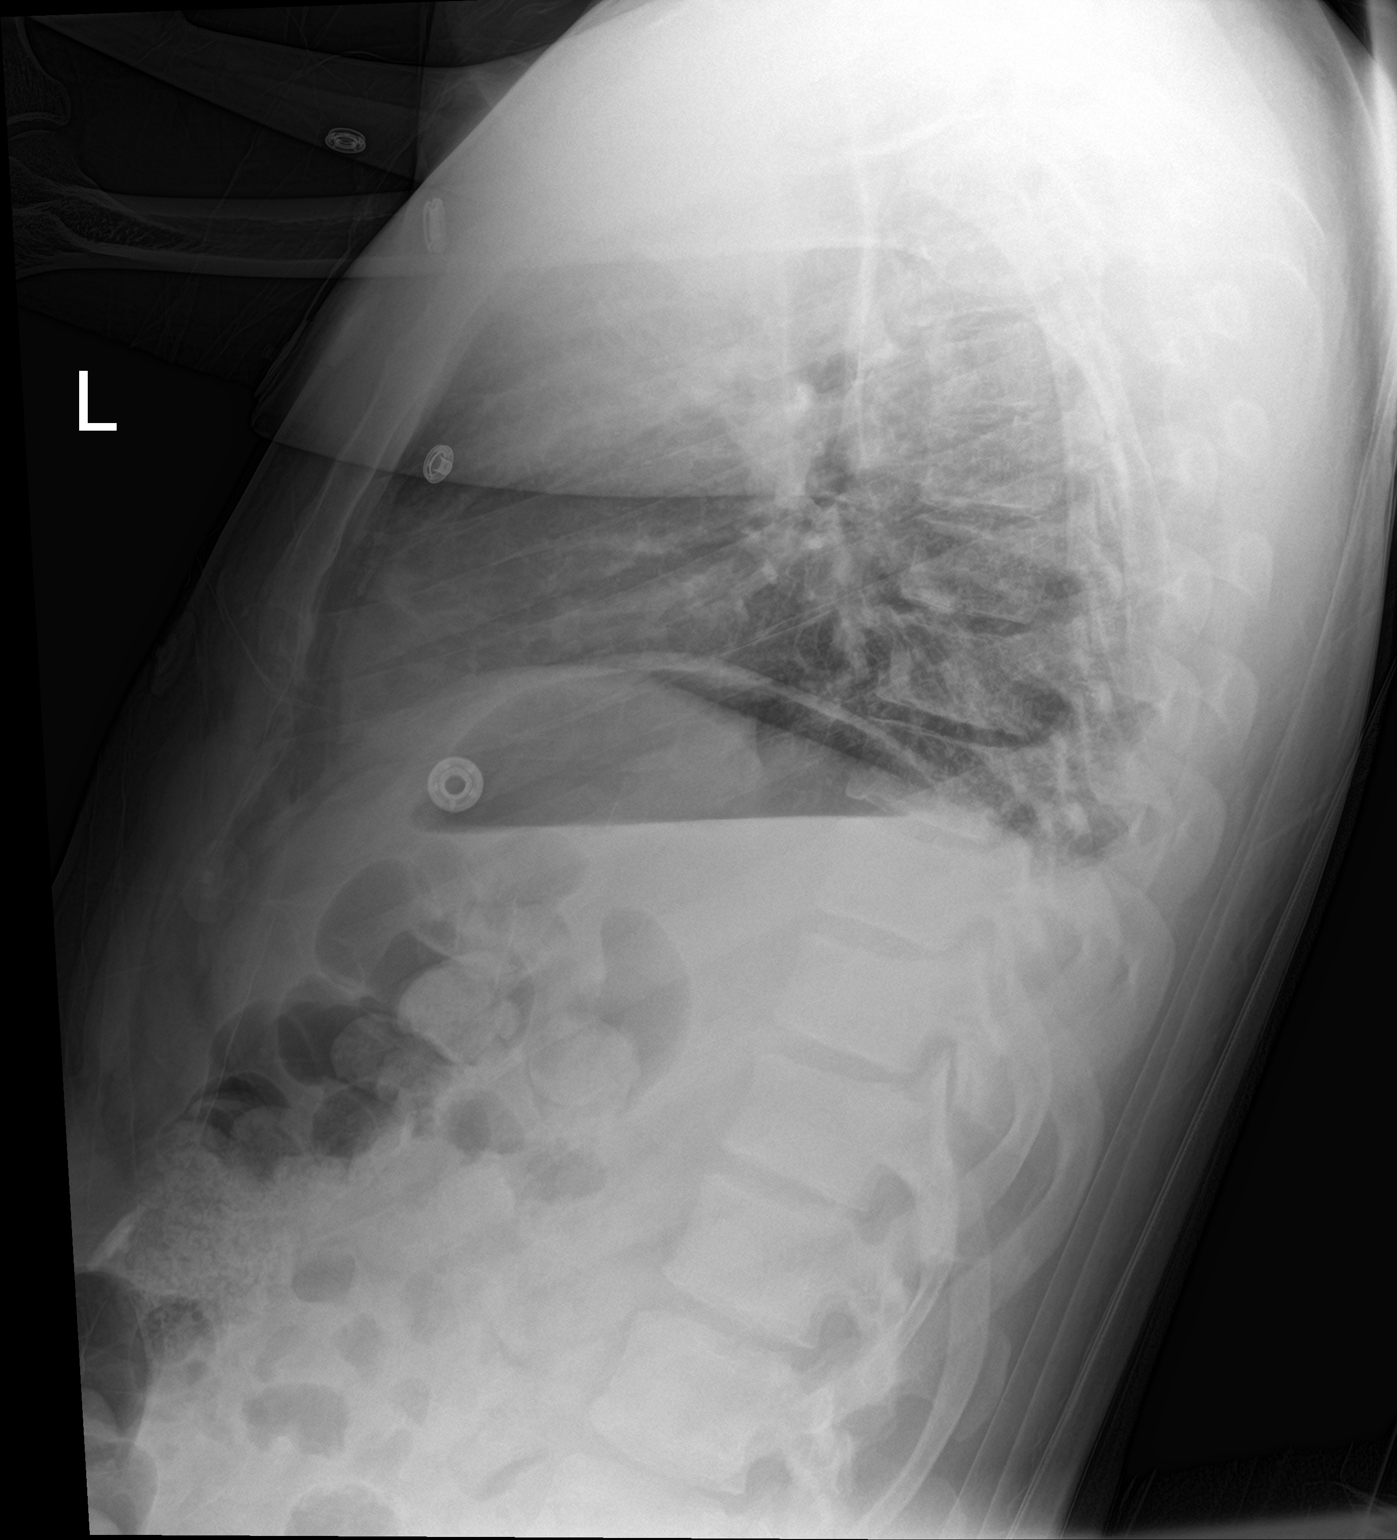

[chest ap]
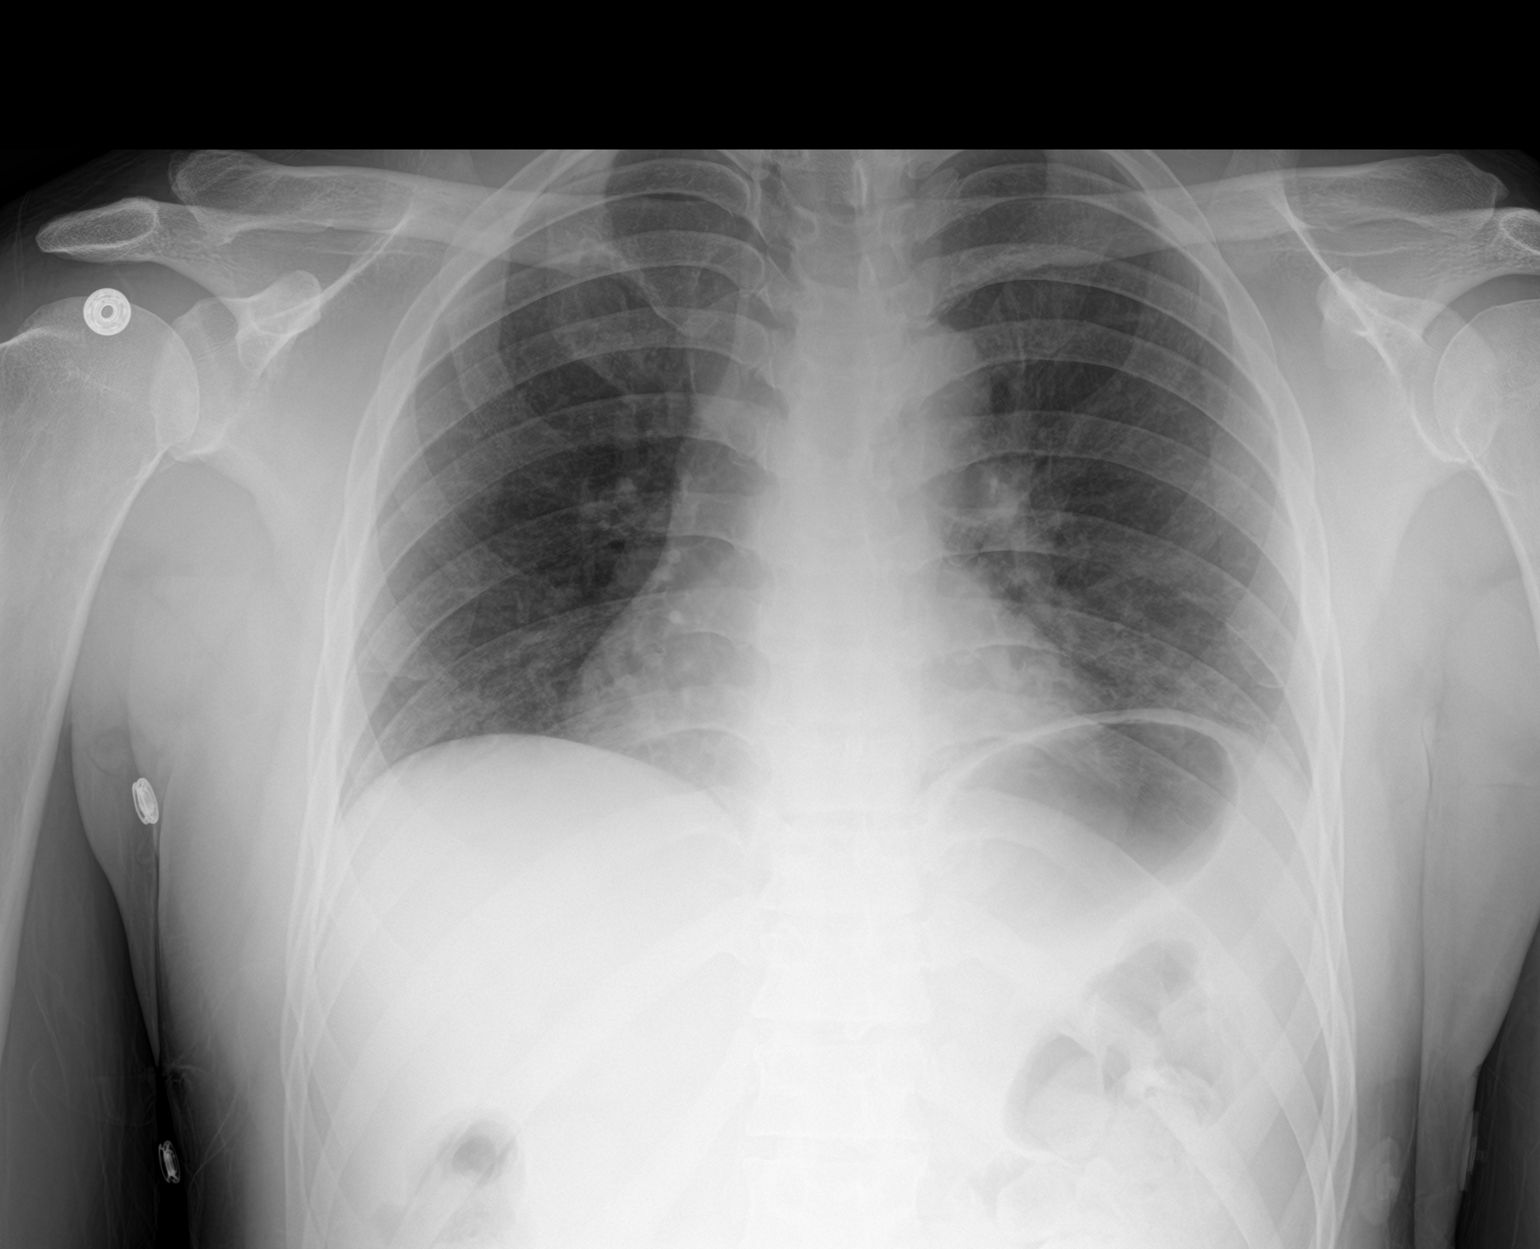

[2 of 2 positions shown; findings below may reference images not displayed]

FINDINGS: Mild left base opacity may relate to the shallow inspiration. No
confluent consolidation. No effusion. Normal pulmonary vasculature.
IMPRESSION: Mild left base opacity, accentuated by the shallow inspiration. No
confluent consolidation. This may be atelectatic.

## 2017-01-05 IMAGING — CT CT HEAD W/O CM
3 series · 15 of 47 positions shown, 18 images · non-contrast
Comparison: Brain MRI dated 01/23/2015 and head CT dated 12/31/2012

CLINICAL DATA: 40-year-old male with altered mental status

EXAM:
CT HEAD WITHOUT CONTRAST
TECHNIQUE: Contiguous axial images were obtained from the base of the skull
through the vertex without intravenous contrast.

[Series 2: head 5.0 h30s · axial · 0.42mm/px · z∈[+1550,+1675]mm · 9 of 31 slices shown, 12 images]
[im 3/31  brain]
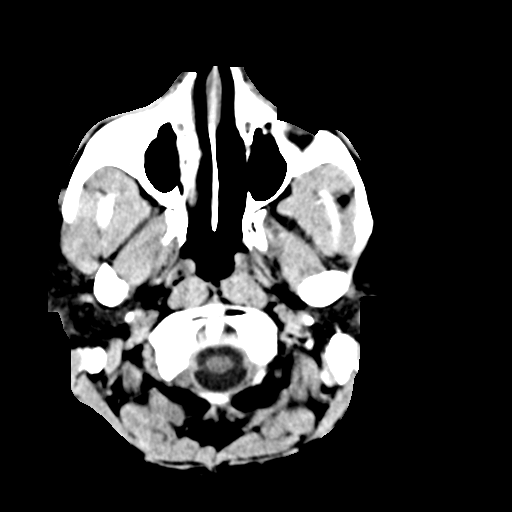
[im 3/31  bone]
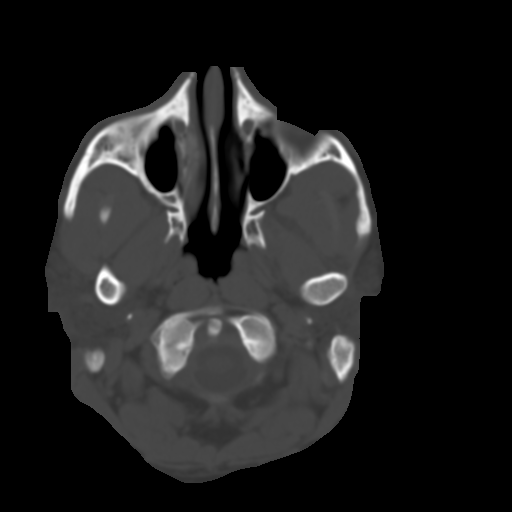
[im 6/31  brain]
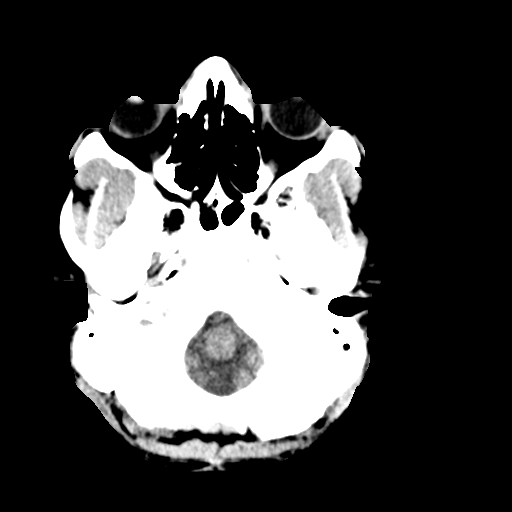
[im 9/31  brain]
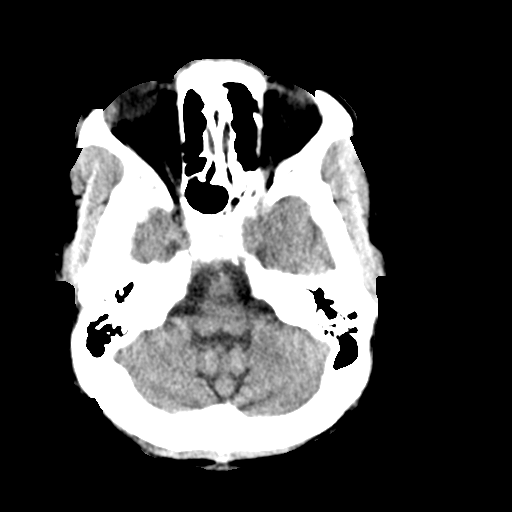
[im 12/31  brain]
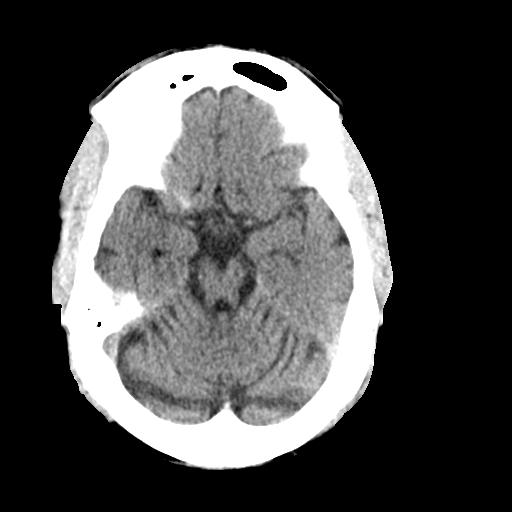
[im 16/31  brain]
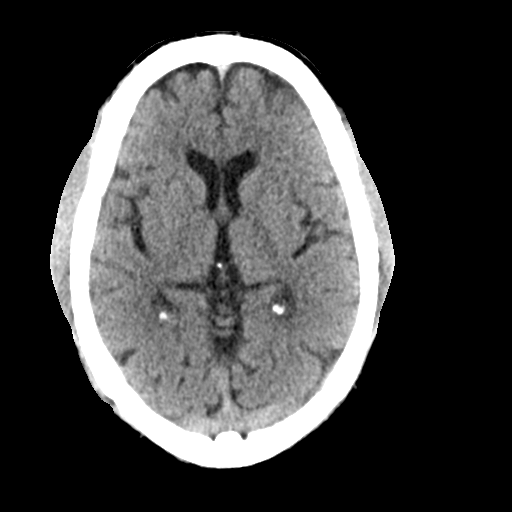
[im 16/31  bone]
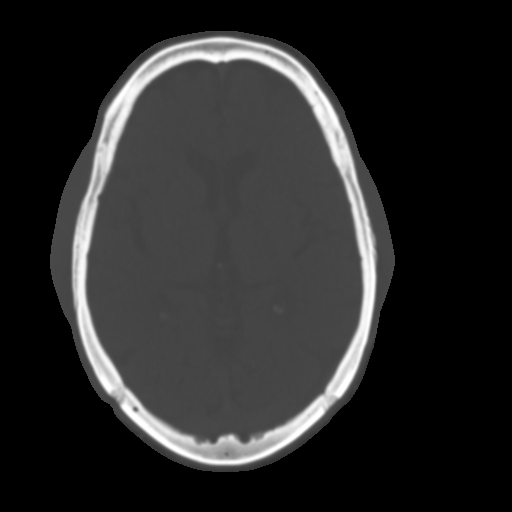
[im 19/31  brain]
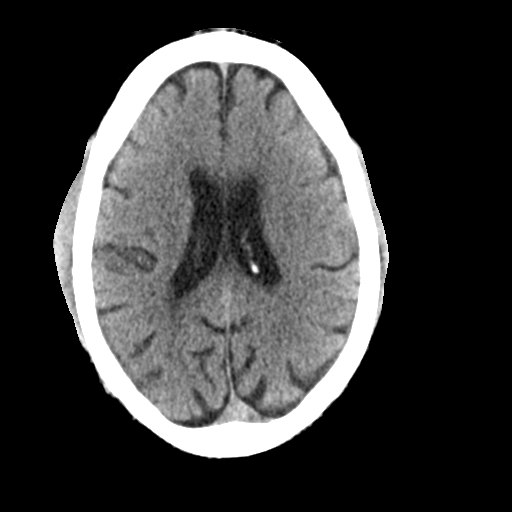
[im 22/31  brain]
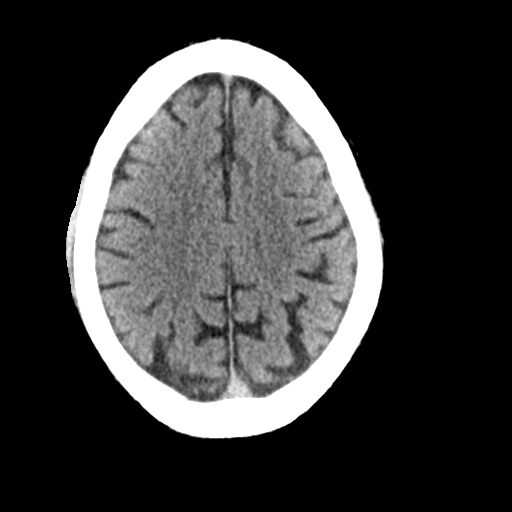
[im 25/31  brain]
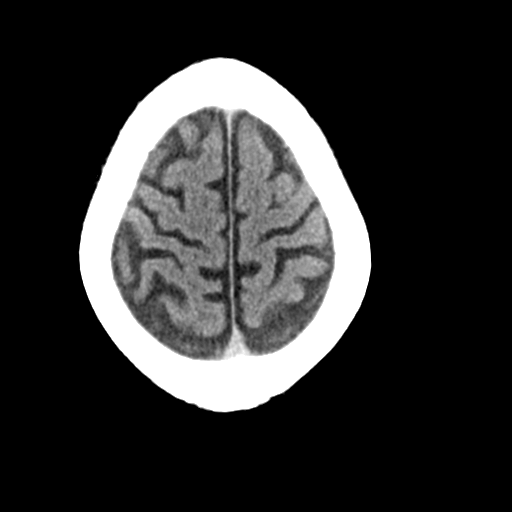
[im 28/31  brain]
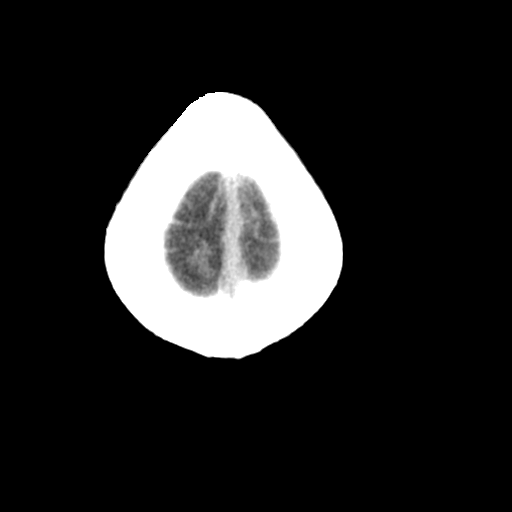
[im 28/31  bone]
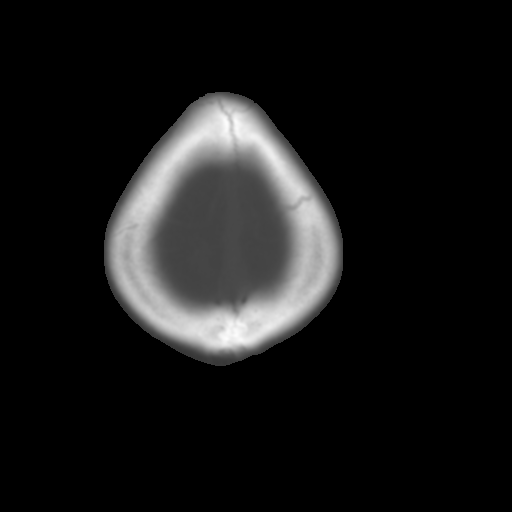

[Series 4: head 3.0 mpr · coronal · 0.31mm/px · 3 of 67 slices shown (1 of 2)]
[im 23/67  brain]
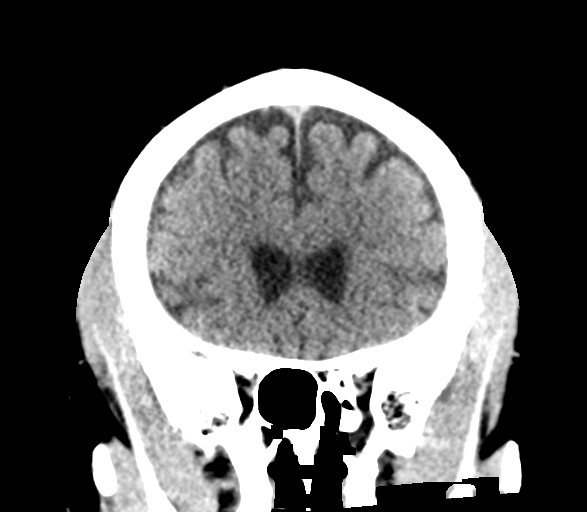
[im 30/67  brain]
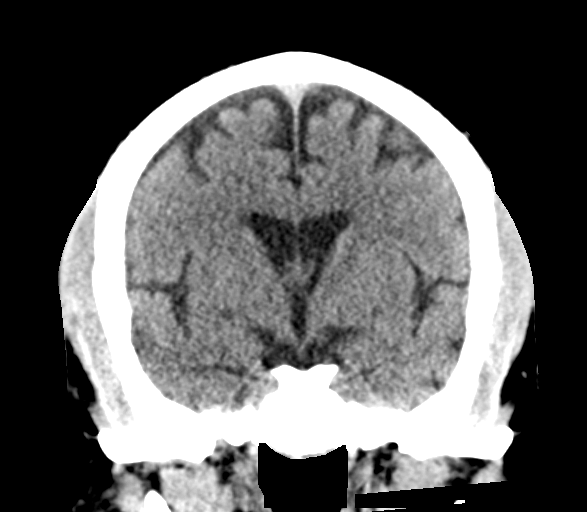
[im 37/67  brain]
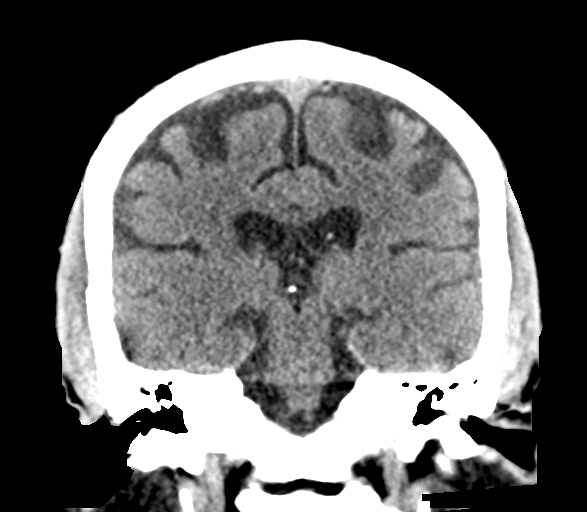

[Series 5: head 3.0 mpr · sagittal · 0.32mm/px · 3 of 54 slices shown (2 of 2)]
[im 18/54  brain]
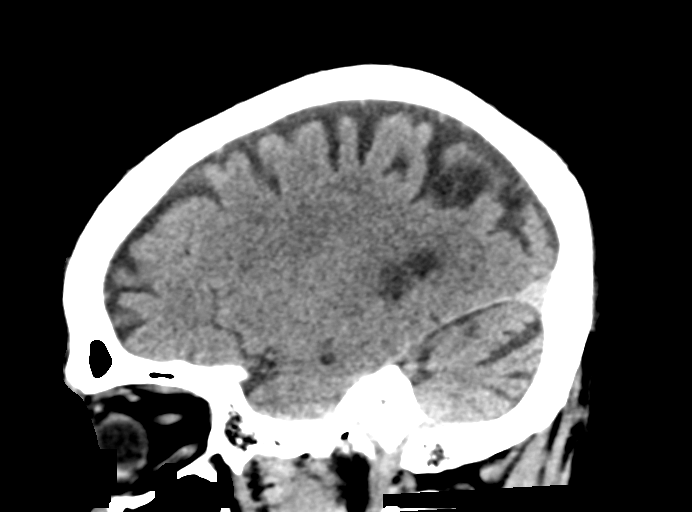
[im 27/54  brain]
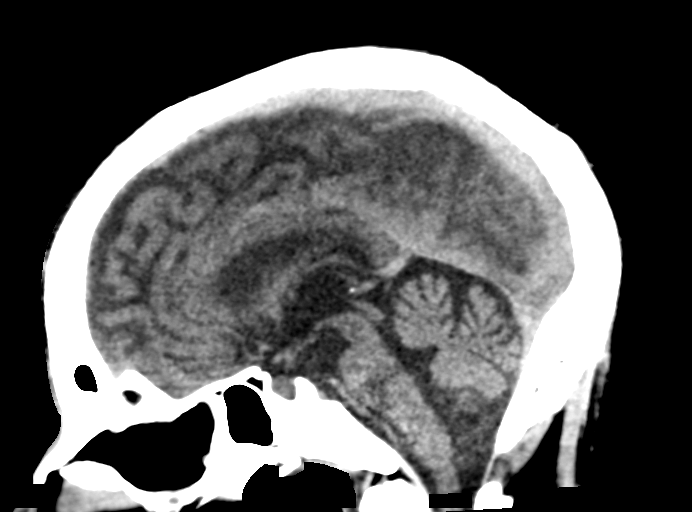
[im 36/54  brain]
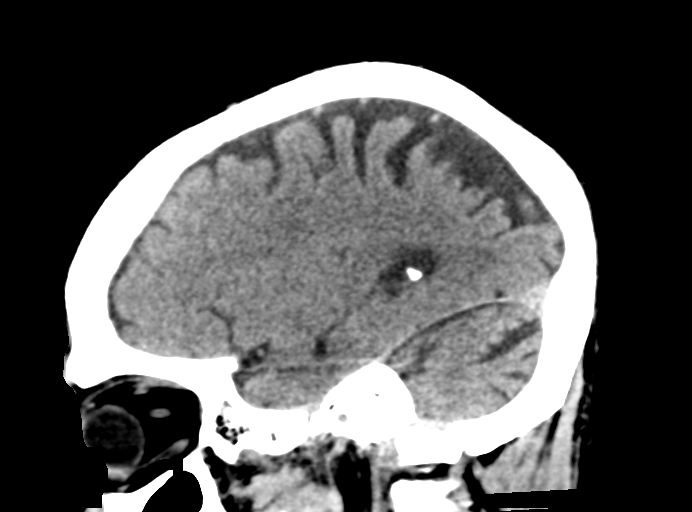

[15 of 47 positions shown; findings below may reference images not displayed]

FINDINGS: There is mild cortical atrophy and chronic microvascular ischemic
changes advanced for the patient's age. There is no acute
intracranial hemorrhage. No mass effect or midline shift noted.

There is mild mucoperiosteal thickening of paranasal sinuses. No
air-fluid levels. The mastoid air cells are clear. The calvarium is
intact.
IMPRESSION: No acute intracranial hemorrhage.

Minimal diffuse atrophy and chronic microvascular ischemic disease.

If symptoms persist and there are no contraindications, MRI may
provide better evaluation if clinically indicated

## 2017-02-21 ENCOUNTER — Other Ambulatory Visit: Payer: Self-pay | Admitting: Neurology

## 2017-05-30 ENCOUNTER — Ambulatory Visit: Payer: Medicaid Other | Admitting: Adult Health

## 2017-05-31 ENCOUNTER — Encounter (HOSPITAL_COMMUNITY): Payer: Self-pay | Admitting: *Deleted

## 2017-05-31 ENCOUNTER — Encounter (HOSPITAL_COMMUNITY): Payer: Self-pay | Admitting: Emergency Medicine

## 2017-05-31 ENCOUNTER — Emergency Department (HOSPITAL_COMMUNITY)
Admission: EM | Admit: 2017-05-31 | Discharge: 2017-06-01 | Disposition: A | Discharge status: Home / Self Care | Payer: Medicaid Other | Source: Home / Self Care | Attending: Emergency Medicine | Admitting: Emergency Medicine

## 2017-05-31 ENCOUNTER — Emergency Department (HOSPITAL_COMMUNITY)
Admission: EM | Admit: 2017-05-31 | Discharge: 2017-05-31 | Disposition: A | Discharge status: Other Acute Inpatient Hospital | Payer: Medicaid Other | Attending: Emergency Medicine | Admitting: Emergency Medicine

## 2017-05-31 ENCOUNTER — Other Ambulatory Visit: Payer: Self-pay

## 2017-05-31 DIAGNOSIS — N183 Chronic kidney disease, stage 3 (moderate): Secondary | ICD-10-CM | POA: Insufficient documentation

## 2017-05-31 DIAGNOSIS — I129 Hypertensive chronic kidney disease with stage 1 through stage 4 chronic kidney disease, or unspecified chronic kidney disease: Secondary | ICD-10-CM | POA: Insufficient documentation

## 2017-05-31 DIAGNOSIS — Z79899 Other long term (current) drug therapy: Secondary | ICD-10-CM | POA: Diagnosis not present

## 2017-05-31 DIAGNOSIS — R197 Diarrhea, unspecified: Secondary | ICD-10-CM

## 2017-05-31 DIAGNOSIS — F17200 Nicotine dependence, unspecified, uncomplicated: Secondary | ICD-10-CM | POA: Insufficient documentation

## 2017-05-31 DIAGNOSIS — N289 Disorder of kidney and ureter, unspecified: Secondary | ICD-10-CM

## 2017-05-31 DIAGNOSIS — R112 Nausea with vomiting, unspecified: Secondary | ICD-10-CM

## 2017-05-31 LAB — COMPREHENSIVE METABOLIC PANEL
ALBUMIN: 3.9 g/dL (ref 3.5–5.0)
ALBUMIN: 4.1 g/dL (ref 3.5–5.0)
ALK PHOS: 47 U/L (ref 38–126)
ALT: 20 U/L (ref 17–63)
ALT: 21 U/L (ref 17–63)
ANION GAP: 15 (ref 5–15)
AST: 18 U/L (ref 15–41)
AST: 29 U/L (ref 15–41)
Alkaline Phosphatase: 51 U/L (ref 38–126)
Anion gap: 11 (ref 5–15)
BUN: 20 mg/dL (ref 6–20)
BUN: 25 mg/dL — AB (ref 6–20)
CALCIUM: 9 mg/dL (ref 8.9–10.3)
CHLORIDE: 106 mmol/L (ref 101–111)
CO2: 21 mmol/L — AB (ref 22–32)
CO2: 18 mmol/L — ABNORMAL LOW (ref 22–32)
CREATININE: 1.73 mg/dL — AB (ref 0.61–1.24)
Calcium: 9 mg/dL (ref 8.9–10.3)
Chloride: 103 mmol/L (ref 101–111)
Creatinine, Ser: 2.23 mg/dL — ABNORMAL HIGH (ref 0.61–1.24)
GFR calc Af Amer: 54 mL/min — ABNORMAL LOW (ref >60)
GFR calc Af Amer: 40 mL/min — ABNORMAL LOW (ref >60)
GFR calc non Af Amer: 47 mL/min — ABNORMAL LOW (ref >60)
GFR, EST NON AFRICAN AMERICAN: 35 mL/min — AB (ref >60)
GLUCOSE: 102 mg/dL — AB (ref 65–99)
GLUCOSE: 86 mg/dL (ref 65–99)
POTASSIUM: 3.4 mmol/L — AB (ref 3.5–5.1)
POTASSIUM: 3.4 mmol/L — AB (ref 3.5–5.1)
SODIUM: 138 mmol/L (ref 135–145)
Sodium: 136 mmol/L (ref 135–145)
Total Bilirubin: 0.7 mg/dL (ref 0.3–1.2)
Total Bilirubin: 0.9 mg/dL (ref 0.3–1.2)
Total Protein: 7.6 g/dL (ref 6.5–8.1)
Total Protein: 7.8 g/dL (ref 6.5–8.1)

## 2017-05-31 LAB — CBC
HEMATOCRIT: 41.2 % (ref 39.0–52.0)
HEMATOCRIT: 40.7 % (ref 39.0–52.0)
HEMOGLOBIN: 13.7 g/dL (ref 13.0–17.0)
Hemoglobin: 14.2 g/dL (ref 13.0–17.0)
MCH: 30.9 pg (ref 26.0–34.0)
MCH: 30.9 pg (ref 26.0–34.0)
MCHC: 34.5 g/dL (ref 30.0–36.0)
MCHC: 33.7 g/dL (ref 30.0–36.0)
MCV: 89.6 fL (ref 78.0–100.0)
MCV: 91.7 fL (ref 78.0–100.0)
PLATELETS: 207 10*3/uL (ref 150–400)
Platelets: 219 10*3/uL (ref 150–400)
RBC: 4.6 MIL/uL (ref 4.22–5.81)
RBC: 4.44 MIL/uL (ref 4.22–5.81)
RDW: 12.5 % (ref 11.5–15.5)
RDW: 12.7 % (ref 11.5–15.5)
WBC: 5.5 10*3/uL (ref 4.0–10.5)
WBC: 8.9 10*3/uL (ref 4.0–10.5)

## 2017-05-31 LAB — LIPASE, BLOOD
LIPASE: 23 U/L (ref 11–51)
Lipase: 24 U/L (ref 11–51)

## 2017-05-31 MED ORDER — SODIUM CHLORIDE 0.9 % IV BOLUS (SEPSIS)
1000.0000 mL | Freq: Once | INTRAVENOUS | Status: AC
  Administered 2017-05-31: 1000 mL via INTRAVENOUS

## 2017-05-31 MED ORDER — ONDANSETRON HCL 4 MG/2ML IJ SOLN
4.0000 mg | Freq: Once | INTRAMUSCULAR | Status: AC
  Administered 2017-05-31: 4 mg via INTRAVENOUS
  Filled 2017-05-31: qty 2

## 2017-05-31 MED ORDER — KETOROLAC TROMETHAMINE 30 MG/ML IJ SOLN
30.0000 mg | Freq: Once | INTRAMUSCULAR | Status: AC
  Administered 2017-05-31: 30 mg via INTRAVENOUS
  Filled 2017-05-31: qty 1

## 2017-05-31 NOTE — ED Triage Notes (Signed)
Pt returns, was seen here early this morning and dx with stomach virus. From group home and pt continues to have D/ after using imodium. Sent here for dehydration per group home.

## 2017-05-31 NOTE — ED Provider Notes (Signed)
[facility] EMERGENCY DEPARTMENT Provider Note   CSN: [id number] Arrival date & time: 05/31/17  1856     History   Chief Complaint Chief Complaint  Patient presents with  . Diarrhea    HPI Glen Johnson is a 42 y.o. male.  HPI  The patient is a 42 year old male, he has a known history of traumatic brain injury, bipolar disorder, seizures and schizophrenia.  He is also known to have stage II chronic kidney disease and high blood pressure.  He was seen approximately 18 hours ago in the emergency department for a diarrheal illness.  At that time he had been complaining of abdominal cramping nausea vomiting and diarrhea that is been going on for 12-24 hours.  Nonbloody, nonbilious, there is no fevers or chills.  The patient has continued to have the symptoms today despite the use of Imodium at home.  He was sent back from his group home for further evaluation and hydration.  The patient does not give any other more information in the way of history other than he tried to eat some potatoes tonight and vomited.  He reports multiple episodes of vomiting and diarrhea today, reports that it is watery.  No caregiver here at this time  Past Medical History:  Diagnosis Date  . Bipolar disorder (HCC)   . Depression   . Hypertension   . Renal disorder    chronic kidney disease, stage II  . Schizophrenia (HCC)   . Seizures (HCC)   . TBI (traumatic brain injury) [facility])     Patient Active Problem List   Diagnosis Date Noted  . 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 Dehydration 09/14/2015  . AKI (acute kidney injury) (HCC) 09/14/2015  . Lactic acidosis 09/14/2015  . Schizophrenia (HCC)   . Seizures (HCC) 02/18/2015  . Abnormality of gait 02/17/2015  . Schizophrenia, unspecified type (HCC)   . Involuntary commitment   . Violent behavior   . 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 TRACHEOSTOMY    . TRACHEOSTOMY CLOSURE         Home Medications    Prior to Admission medications   Medication Sig Start Date End Date Taking? Authorizing Provider  amLODipine (NORVASC) 10 MG tablet Take 1 tablet (10 mg total) by mouth daily. 09/04/15   Charm Rings, NP  busPIRone (BUSPAR) 5 MG tablet Take 1 tablet (5 mg total) by mouth 3 (three) times daily. Patient taking differently: Take 10 mg by mouth 3 (three) times daily.  09/25/15   Vassie Loll, MD  clonazePAM (KLONOPIN) 0.5 MG tablet Take 1 tablet (0.5 mg total) by mouth 2 (two) times daily as needed (anxiety). 09/25/15   Vassie Loll, MD  divalproex (DEPAKOTE ER) 500 MG 24 hr tablet Take 1 tablet (500 mg total) by mouth 2 (two) times daily. 05/27/16   Levert Feinstein, MD  divalproex (DEPAKOTE ER) 500 MG 24 hr tablet Take 500 mg by mouth every morning.    [provider]  glycopyrrolate (ROBINUL) 2 MG tablet Take 1 tablet (2 mg total) by mouth 3 (three) times daily. 09/04/15   Charm Rings, NP  levETIRAcetam (KEPPRA) 500 MG tablet TAKE (1) TABLET BY MOUTH TWICE DAILY. 02/22/17   Levert Feinstein, MD  Lurasidone HCl 60 MG TABS Take 60 mg by mouth at bedtime. 09/25/15   Vassie Loll, MD  metoprolol tartrate (LOPRESSOR) 25  MG tablet Take 1 tablet (25 mg total) by mouth 2 (two) times daily. 09/25/15   Vassie Loll, MD  mirtazapine (REMERON) 15 MG tablet Take 1 tablet (15 mg total) by mouth at bedtime. 09/04/15   Charm Rings, NP  ondansetron (ZOFRAN ODT) 4 MG disintegrating tablet Take 1 tablet (4 mg total) by mouth every 8 (eight) hours as needed for nausea. 06/01/17   Eber Hong, MD    Family History Family History  Problem Relation Age of Onset  . Seizures Mother   . Migraines Mother   . Arthritis Mother     Social History Social History   Tobacco Use  . Smoking status: Former Games developer  . Smokeless tobacco: Never Used  Substance Use Topics  . Alcohol use: No    Alcohol/week: 0.0  oz    Comment: Stopped one year ago.  . Drug use: No    Comment: Stopped one year ago.     Allergies   Patient has no known allergies.   Review of Systems Review of Systems  All other systems reviewed and are negative.    Physical Exam Updated Vital Signs BP (!) 122/93 (BP Location: Left Arm)   Pulse (!) 110   Temp 98.7 F (37.1 C) (Oral)   Resp 15   SpO2 99%   Physical Exam  Constitutional: He appears well-developed and well-nourished. No distress.  HENT:  Head: Normocephalic and atraumatic.  Mouth/Throat: Oropharynx is clear and moist. No oropharyngeal exudate.  Eyes: Conjunctivae and EOM are normal. Pupils are equal, round, and reactive to light. Right eye exhibits no discharge. Left eye exhibits no discharge. No scleral icterus.  Neck: Normal range of motion. Neck supple. No JVD present. No thyromegaly present.  Cardiovascular: Normal rate, regular rhythm, normal heart sounds and intact distal pulses. Exam reveals no gallop and no friction rub.  No murmur heard. Pulmonary/Chest: Effort normal and breath sounds normal. No respiratory distress. He has no wheezes. He has no rales.  Abdominal: Soft. Bowel sounds are normal. He exhibits no distension and no mass. There is no tenderness.  Diffusely soft abdomen - no focal ttp, no guarding, no masses, no peritoneal signs  Musculoskeletal: Normal range of motion. He exhibits no edema or tenderness.  Lymphadenopathy:    He has no cervical adenopathy.  Neurological: He is alert. Coordination normal.  The pt is able to talk in segments / choppy - is able to follow commands - witnessed patient walking in hallway without any difficulty  Skin: Skin is warm and dry. No rash noted. No erythema.  Psychiatric: He has a normal mood and affect. His behavior is normal.  Nursing note and vitals reviewed.    ED Treatments / Results  Labs (all labs ordered are listed, but only abnormal results are displayed) Labs Reviewed    COMPREHENSIVE METABOLIC PANEL - Abnormal; Notable for the following components:      Result Value   Potassium 3.4 (*)    CO2 18 (*)    BUN 25 (*)    Creatinine, Ser 2.23 (*)    GFR calc non Af Amer 35 (*)    GFR calc Af Amer 40 (*)    All other components within normal limits  LIPASE, BLOOD  CBC  URINALYSIS, ROUTINE W REFLEX MICROSCOPIC     Radiology No results found.  Procedures Procedures (including critical care time)  Medications Ordered in ED Medications  sodium chloride 0.9 % bolus 1,000 mL (1,000 mLs Intravenous New Bag/Given 05/31/17 2150)  ondansetron [facility](ZOFRAN) injection 4 mg (4 mg Intravenous Given 05/31/17 2150)     Initial Impression / Assessment and Plan / ED Course  I have reviewed the triage vital signs and the nursing notes.  Pertinent labs & imaging results that were available during my care of the patient were reviewed by me and considered in my medical decision making (see chart for details).     Labs are remarkable for a slight increase in his creatinine, BUN of 25, this is slightly above his baseline and consistent with a dehydrated state.  Lipase is normal, blood counts are normal including his white blood cell count of 8900 and his hemoglobin of 13.7.  Urinalysis pending, IV fluids will be given, antiemetics will be given.  The patient has a nonsurgical abdomen which is essentially nontender, no leukocytosis and no fever.  He likely has a benign process and conservative therapy with supportive fluids and antiemetics is the best option at this point.  Tolerated fluids, 1 L of IV fluids given, renal insufficiency is chronic, slightly worse today, he will need to have this followed up, he was informed of this, this was in written form and communicated to his group home as well.  Labs reassuring with normal white blood cell count, electrolytes, and lipase.  Final Clinical Impressions(s) / ED Diagnoses   Final diagnoses:  Nausea vomiting and diarrhea   Renal insufficiency    ED Discharge Orders        Ordered    ondansetron (ZOFRAN ODT) 4 MG disintegrating tablet  Every 8 hours PRN     06/01/17 0028       Eber HongMiller, Zeena Starkel, MD 06/01/17 [phone]

## 2017-05-31 NOTE — Discharge Instructions (Signed)
Push fluids for the next 24 hours.  Return to the emergency department for severe abdominal pain, bloody stools, high fevers, or other new and concerning symptoms.

## 2017-05-31 NOTE — ED Triage Notes (Signed)
Pt having episodes of diarrhea that started today; caregiver states pt has had a low grade fever today and has been c/o right leg pain and generalized abdominal pain

## 2017-05-31 NOTE — ED Notes (Signed)
ED Provider at bedside. 

## 2017-05-31 NOTE — ED Provider Notes (Signed)
[facility] EMERGENCY DEPARTMENT Provider Note   CSN: [id number] Arrival date & time: 05/31/17  0136     History   Chief Complaint Chief Complaint  Patient presents with  . Diarrhea    HPI Glen Johnson is a 42 y.o. male.  Patient is a 42 year old male with past medical history of bipolar, depression, schizophrenia, hypertension presenting for evaluation of abdominal cramping, nausea, vomiting, diarrhea that started yesterday.  It worsened today.  All has been nonbloody and nonbilious.  There is been no fevers or chills.  Patient offers little history secondary to behavioral health issues.  Majority of the history taken from the caretaker present at bedside.   The history is provided by the patient and a caregiver.  Diarrhea   This is a new problem. The current episode started yesterday. The problem occurs continuously. The problem has been gradually worsening. There has been no fever. Associated symptoms include abdominal pain and vomiting. Pertinent negatives include no chills. He has tried nothing for the symptoms. The treatment provided no relief.    Past Medical History:  Diagnosis Date  . Bipolar disorder (HCC)   . Depression   . Hypertension   . Renal disorder    chronic kidney disease, stage II  . Schizophrenia (HCC)   . Seizures (HCC)   . TBI (traumatic brain injury) [facility])     Patient Active Problem List   Diagnosis Date Noted  . 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 Dehydration 09/14/2015  . AKI (acute kidney injury) (HCC) 09/14/2015  . Lactic acidosis 09/14/2015  . Schizophrenia (HCC)   . Seizures (HCC) 02/18/2015  . Abnormality of gait 02/17/2015  . Schizophrenia, unspecified type (HCC)   . Involuntary commitment   . Violent behavior   . 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 TRACHEOSTOMY    . TRACHEOSTOMY  CLOSURE         Home Medications    Prior to Admission medications   Medication Sig Start Date End Date Taking? Authorizing Provider  divalproex (DEPAKOTE ER) 500 MG 24 hr tablet Take 500 mg by mouth every morning.   Yes [provider]  amLODipine (NORVASC) 10 MG tablet Take 1 tablet (10 mg total) by mouth daily. 09/04/15   Charm Rings, NP  busPIRone (BUSPAR) 5 MG tablet Take 1 tablet (5 mg total) by mouth 3 (three) times daily. Patient taking differently: Take 10 mg by mouth 3 (three) times daily.  09/25/15   Vassie Loll, MD  clonazePAM (KLONOPIN) 0.5 MG tablet Take 1 tablet (0.5 mg total) by mouth 2 (two) times daily as needed (anxiety). 09/25/15   Vassie Loll, MD  divalproex (DEPAKOTE ER) 500 MG 24 hr tablet Take 1 tablet (500 mg total) by mouth 2 (two) times daily. 05/27/16   Levert Feinstein, MD  glycopyrrolate (ROBINUL) 2 MG tablet Take 1 tablet (2 mg total) by mouth 3 (three) times daily. 09/04/15   Charm Rings, NP  levETIRAcetam (KEPPRA) 500 MG tablet TAKE (1) TABLET BY MOUTH TWICE DAILY. 02/22/17   Levert Feinstein, MD  Lurasidone HCl 60 MG TABS Take 60 mg by mouth at bedtime. 09/25/15   Vassie Loll, MD  metoprolol tartrate (LOPRESSOR) 25 MG tablet Take 1 tablet (25 mg total) by mouth 2 (two) times daily. 09/25/15   Vassie Loll, MD  mirtazapine (REMERON) 15 MG tablet Take 1 tablet (15 mg total)  by mouth at bedtime. 09/04/15   Charm Rings, NP    Family History Family History  Problem Relation Age of Onset  . Seizures Mother   . Migraines Mother   . Arthritis Mother     Social History Social History   Tobacco Use  . Smoking status: Current Some Day Smoker  . Smokeless tobacco: Never Used  Substance Use Topics  . Alcohol use: No    Alcohol/week: 0.0 oz    Comment: Stopped one year ago.  . Drug use: No    Comment: Stopped one year ago.     Allergies   Patient has no known allergies.   Review of Systems Review of Systems  Constitutional: Negative  for chills.  Gastrointestinal: Positive for abdominal pain, diarrhea and vomiting.  All other systems reviewed and are negative.    Physical Exam Updated Vital Signs BP (!) 140/94 (BP Location: Left Arm)   Pulse 95   Temp 98.9 F (37.2 C) (Oral)   Resp 19   Ht 5\' 11"  (1.803 m)   Wt 83.9 kg (185 lb)   SpO2 98%   BMI 25.80 kg/m   Physical Exam  Constitutional: He is oriented to person, place, and time. He appears well-developed and well-nourished. No distress.  HENT:  Head: Normocephalic and atraumatic.  Mouth/Throat: Oropharynx is clear and moist.  Neck: Normal range of motion. Neck supple.  Cardiovascular: Normal rate and regular rhythm. Exam reveals no friction rub.  No murmur heard. Pulmonary/Chest: Effort normal and breath sounds normal. No respiratory distress. He has no wheezes. He has no rales.  Abdominal: Soft. Bowel sounds are normal. He exhibits no distension. There is no tenderness.  Musculoskeletal: Normal range of motion. He exhibits no edema.  Neurological: He is alert and oriented to person, place, and time. Coordination normal.  Skin: Skin is warm and dry. He is not diaphoretic.  Nursing note and vitals reviewed.    ED Treatments / Results  Labs (all labs ordered are listed, but only abnormal results are displayed) Labs Reviewed  LIPASE, BLOOD  COMPREHENSIVE METABOLIC PANEL  CBC  URINALYSIS, ROUTINE W REFLEX MICROSCOPIC    EKG  EKG Interpretation None       Radiology No results found.  Procedures Procedures (including critical care time)  Medications Ordered in ED Medications  sodium chloride 0.9 % bolus 1,000 mL (not administered)  ketorolac (TORADOL) 30 MG/ML injection 30 mg (not administered)  ondansetron (ZOFRAN) injection 4 mg (not administered)     Initial Impression / Assessment and Plan / ED Course  I have reviewed the triage vital signs and the nursing notes.  Pertinent labs & imaging results that were available during my  care of the patient were reviewed by me and considered in my medical decision making (see chart for details).  Patient brought here by caretaker for evaluation of diarrhea that has been ongoing for the past 2 days.  His abdomen is benign and diarrhea is nonbloody.  His laboratory studies are reassuring.  He has no elevation of white count and normal hemoglobin.  His electrolytes are essentially unremarkable.  He does have a creatinine of 1.7, however this is consistent with previous results.  I highly suspect a viral etiology.  I see no indication for further workup and believe he is appropriate for discharge.  He did receive normal saline by IV while in the emergency department.  Final Clinical Impressions(s) / ED Diagnoses   Final diagnoses:  None    ED  Discharge Orders    None       Geoffery Lyonselo, Kayson Bullis, MD 05/31/17 [phone]

## 2017-06-01 MED ORDER — ONDANSETRON 4 MG PO TBDP: 4.0000 mg | ORAL_TABLET | Freq: Three times a day (TID) | ORAL | 0 refills | Status: AC | PRN

## 2017-06-01 NOTE — ED Notes (Signed)
Pt attempted x 2 to use urinal with no success

## 2017-06-01 NOTE — Discharge Instructions (Signed)
Please expect your vomiting and diarrhea to last for another 1 or 2 days.  You may take Zofran every 6 hours as needed for nausea, drink plenty of clear liquids and fluids.  You may use Imodium to help with diarrhea every time you have a diarrhea please take an Imodium tablet. Seek medical attention for severe fevers vomiting or pain You are likely contagious to others so please make sure that you wash your  hands well. Your tests show that your kidneys are a little dry because of the vomiting and diarrhea - please have your blood work repeated in in 1 week for a recheck

## 2017-07-21 ENCOUNTER — Other Ambulatory Visit: Payer: Self-pay | Admitting: Neurology

## 2017-07-27 ENCOUNTER — Ambulatory Visit: Payer: Medicaid Other | Admitting: Nurse Practitioner

## 2017-07-28 ENCOUNTER — Encounter: Payer: Self-pay | Admitting: Neurology

## 2017-07-28 ENCOUNTER — Ambulatory Visit: Payer: Medicaid Other | Admitting: Neurology

## 2017-07-28 VITALS — BP 132/84 | Wt 190.0 lb

## 2017-07-28 DIAGNOSIS — R569 Unspecified convulsions: Secondary | ICD-10-CM

## 2017-07-28 DIAGNOSIS — F209 Schizophrenia, unspecified: Secondary | ICD-10-CM | POA: Diagnosis not present

## 2017-07-28 MED ORDER — LEVETIRACETAM 500 MG PO TABS: 500.0000 mg | ORAL_TABLET | Freq: Two times a day (BID) | ORAL | 4 refills | Status: AC

## 2017-07-28 MED ORDER — DIVALPROEX SODIUM ER 500 MG PO TB24: ORAL_TABLET | ORAL | 4 refills | Status: AC

## 2017-07-28 NOTE — Progress Notes (Signed)
PATIENT: Glen Johnson DOB: [birth year]  REASON FOR VISIT: follow up- traumatic brain injury, schizophrenia, seizures HISTORY FROM: patient  HISTORY OF PRESENT ILLNESS:   HISTORY: [name]) Glen Johnson is 42 yo RH with his mother, seen in refer by his primary care physician Dr.Edwin Concepcion Elk, MD in Dec 30 2014 for evaluation of seizure.  He had a history of traumatic brain injury in 2012, was treated at [facility], stayed in  hospital for 2 and half months, required tracheostomy, PEG tube, prolonged rehabilitation, had slurred speech, gait difficulty ever since.  He had long-standing history of schizophrenia, often stayed away from his family, was found on the street, ever since the brain trauma, he seems to have more spells of seizure-like activity, sudden loss of consciousness, patient was not able to provide detailed history, he is amnestic of his traumatic brain injury event, he now lives with his mother now, reported his anger control problem, last seizure was in August 2016.  He is now on polypharmacy treatment, including Latuda 80 mg, seroquel 50 mg twice a day, Remeron 15 mg at bedtime, clonazepam, also Depakote ER 500 mg twice a day, Keppra 500 mg twice a day, BuSpar 5 mg daily  He is tolerating his medications, overall fairly stable, I have reviewed his most recent psychiatric visit, carried a diagnosis of schizophrenia, violent behavior, Adjustment disorder with mixed disturbance of emotions and conduct  UPDATE Dec 5th 2016: I have personally reviewed MRI brain w/wo in Nov 2016, that was normal. He fell in Nov 2016 from ground level, no injury. No recurrent seizure, He is now taking Depakote ER  bid, keppra  bid. He forgets to take his medication sometimes.   He had gradual worsening gait difficulty since 2014, wide based unsteady gait. Mother report family history of disabilitating arthritis. Has no bowel and bladder  incontinence  Update May 27 2016, He is accompanied by current guardian [name] at today's visit, he has been at current group home since May 11 2016,  He had no recurrent seizure, currently taking Keppra 500 mg twice a day, Depakote ER 500 mg twice a day, there was no reported anger control problem.  UPDATE Jul 28 2017: He is currently lives at home, accompanied by group home manager [name] at today's visit, he is overall doing very well, no recurrent seizure, mild unsteady gait,  REVIEW OF SYSTEMS: Out of a complete 14 system review of symptoms, the patient complains only of the following symptoms, and all other reviewed systems are negative.  ALLERGIES: No Known Allergies  HOME MEDICATIONS: Outpatient Medications Prior to Visit  Medication Sig Dispense Refill  . amLODipine (NORVASC) 10 MG tablet Take 1 tablet (10 mg total) by mouth daily. 30 tablet 0  . busPIRone (BUSPAR) 5 MG tablet Take 1 tablet (5 mg total) by mouth 3 (three) times daily. (Patient taking differently: Take 10 mg by mouth 3 (three) times daily. ) 90 tablet 0  . clonazePAM (KLONOPIN) 0.5 MG tablet Take 1 tablet (0.5 mg total) by mouth 2 (two) times daily as needed (anxiety). 30 tablet 0  . divalproex (DEPAKOTE ER) 500 MG 24 hr tablet Take 1 tablet (500 mg total) by mouth 2 (two) times daily. 180 tablet 4  . divalproex (DEPAKOTE ER) 500 MG 24 hr tablet Take 500 mg by mouth every morning.    Marland Kitchen glycopyrrolate (ROBINUL) 2 MG tablet Take 1 tablet (2 mg total) by mouth 3 (three) times daily. 90 tablet 0  .  levETIRAcetam (KEPPRA) 500 MG tablet TAKE (1) TABLET BY MOUTH TWICE DAILY. 60 tablet 0  . Lurasidone HCl 60 MG TABS Take 60 mg by mouth at bedtime. 30 tablet 0  . metoprolol tartrate (LOPRESSOR) 25 MG tablet Take 1 tablet (25 mg total) by mouth 2 (two) times daily. 60 tablet 0  . mirtazapine (REMERON) 15 MG tablet Take 1 tablet (15 mg total) by mouth at bedtime. 30 tablet 0  . ondansetron (ZOFRAN ODT) 4 MG  disintegrating tablet Take 1 tablet (4 mg total) by mouth every 8 (eight) hours as needed for nausea. 10 tablet 0   No facility-administered medications prior to visit.     PAST MEDICAL HISTORY: Past Medical History:  Diagnosis Date  . Bipolar disorder (HCC)   . Depression   . Hypertension   . Renal disorder    chronic kidney disease, stage II  . Schizophrenia (HCC)   . Seizures (HCC)   . TBI (traumatic brain injury) (HCC)     PAST SURGICAL HISTORY: Past Surgical History:  Procedure Laterality Date  . TRACHEOSTOMY    . TRACHEOSTOMY CLOSURE      FAMILY HISTORY: Family History  Problem Relation Age of Onset  . Seizures Mother   . Migraines Mother   . Arthritis Mother     SOCIAL HISTORY: Social History   Socioeconomic History  . Marital status: Single    Spouse name: Not on file  . Number of children: 2  . Years of education: 17  . Highest education level: Not on file  Occupational History  . Occupation: Disabled  Social Needs  . Financial resource strain: Not on file  . Food insecurity:    Worry: Not on file    Inability: Not on file  . Transportation needs:    Medical: Not on file    Non-medical: Not on file  Tobacco Use  . Smoking status: Former Games developer  . Smokeless tobacco: Never Used  Substance and Sexual Activity  . Alcohol use: No    Alcohol/week: 0.0 oz    Comment: Stopped one year ago.  . Drug use: No    Comment: Stopped one year ago.  Marland Kitchen Sexual activity: Not on file  Lifestyle  . Physical activity:    Days per week: Not on file    Minutes per session: Not on file  . Stress: Not on file  Relationships  . Social connections:    Talks on phone: Not on file    Gets together: Not on file    Attends religious service: Not on file    Active member of club or organization: Not on file    Attends meetings of clubs or organizations: Not on file    Relationship status: Not on file  . Intimate partner violence:    Fear of current or ex partner: Not  on file    Emotionally abused: Not on file    Physically abused: Not on file    Forced sexual activity: Not on file  Other Topics Concern  . Not on file  Social History Narrative   Pt lives at [facility] as of June or July 2017.      PHYSICAL EXAM  Vitals:   07/28/17 1326  BP: 132/84  Weight: 190 lb (86.2 kg)   Body mass index is 26.5 kg/m.   PHYSICAL EXAMNIATION:  Gen: NAD, conversant, well nourised, obese, well groomed  Cardiovascular: Regular rate rhythm, no peripheral edema, warm, nontender. Eyes: Conjunctivae clear without exudates or hemorrhage Neck: Supple, no carotid bruits. Pulmonary: Clear to auscultation bilaterally   NEUROLOGICAL EXAM:  MENTAL STATUS: Speech/cognition: He has mild slurred speech, cooperative on examinations,   CRANIAL NERVES: CN II: Visual fields are full to confrontation. Fundoscopic exam is normal with sharp discs and no vascular changes. Pupils are round equal and briskly reactive to light. CN III, IV, VI: extraocular movement are normal. No ptosis. CN V: Facial sensation is intact to pinprick in all 3 divisions bilaterally. Corneal responses are intact.  CN VII: Face is symmetric with normal eye closure and smile. CN VIII: Hearing is normal to rubbing fingers CN IX, X: Palate elevates symmetrically. Phonation is normal. CN XI: Head turning and shoulder shrug are intact CN XII: Tongue is midline with normal movements and no atrophy.  MOTOR: He has mild bilateral lower extremity rigidity, mild spastic left hemiparesis, upper and lower extremity motor strength is 4 plus/5.  REFLEXES: Reflexes are 2+ and symmetric at the biceps, triceps, knees, and ankles. Plantar responses are flexor.  SENSORY: Intact to light touch, pinprick, positional and vibratory sensation are intact in fingers and toes.  COORDINATION: Rapid alternating movements and fine finger movements are intact. There is no dysmetria on  finger-to-nose and heel-knee-shin.    GAIT/STANCE: He needs push up to get up from seated position, wide based, cautious, dragging left leg, tends to hold the left arm and elbow flexion wrist flexion   DIAGNOSTIC DATA (LABS, IMAGING, TESTING) - I reviewed patient records, labs, notes, testing and imaging myself where available.  Lab Results  Component Value Date   WBC 8.9 05/31/2017   HGB 13.7 05/31/2017   HCT 40.7 05/31/2017   MCV 91.7 05/31/2017   PLT 219 05/31/2017      Component Value Date/Time   NA 136 05/31/2017 1952   K 3.4 (L) 05/31/2017 1952   CL 103 05/31/2017 1952   CO2 18 (L) 05/31/2017 1952   GLUCOSE 86 05/31/2017 1952   BUN 25 (H) 05/31/2017 1952   CREATININE 2.23 (H) 05/31/2017 1952   CALCIUM 9.0 05/31/2017 1952   PROT 7.8 05/31/2017 1952   ALBUMIN 4.1 05/31/2017 1952   AST 29 05/31/2017 1952   ALT 21 05/31/2017 1952   ALKPHOS 47 05/31/2017 1952   BILITOT 0.9 05/31/2017 1952   GFRNONAA 35 (L) 05/31/2017 1952   GFRAA 40 (L) 05/31/2017 1952      ASSESSMENT AND PLAN 42 y.o. year old male  History of traumatic brain injury Epilepsy Schizophrenia  He is doing well on current Keppra 500 mg twice a day  Depakote ER 500 mg twice a day  Refill his medications return to clinic in one year  Levert Feinstein, M.D. Ph.D.  [facility] [address] [place], [place] [address] Phone: [phone] Fax:      [phone]

## 2017-10-18 ENCOUNTER — Emergency Department (HOSPITAL_COMMUNITY): Payer: Medicaid Other

## 2017-10-18 ENCOUNTER — Inpatient Hospital Stay (HOSPITAL_COMMUNITY): Payer: Medicaid Other

## 2017-10-18 ENCOUNTER — Inpatient Hospital Stay (HOSPITAL_COMMUNITY)
Admission: EM | Admit: 2017-10-18 | Discharge: 2017-11-13 | DRG: 870 | Disposition: E | Payer: Medicaid Other | Attending: Pulmonary Disease | Admitting: Pulmonary Disease

## 2017-10-18 ENCOUNTER — Encounter (HOSPITAL_COMMUNITY): Payer: Self-pay | Admitting: Emergency Medicine

## 2017-10-18 DIAGNOSIS — D649 Anemia, unspecified: Secondary | ICD-10-CM | POA: Diagnosis present

## 2017-10-18 DIAGNOSIS — A419 Sepsis, unspecified organism: Secondary | ICD-10-CM | POA: Diagnosis present

## 2017-10-18 DIAGNOSIS — K921 Melena: Secondary | ICD-10-CM | POA: Diagnosis not present

## 2017-10-18 DIAGNOSIS — M6282 Rhabdomyolysis: Secondary | ICD-10-CM | POA: Diagnosis not present

## 2017-10-18 DIAGNOSIS — G931 Anoxic brain damage, not elsewhere classified: Secondary | ICD-10-CM | POA: Diagnosis not present

## 2017-10-18 DIAGNOSIS — E860 Dehydration: Secondary | ICD-10-CM

## 2017-10-18 DIAGNOSIS — G40909 Epilepsy, unspecified, not intractable, without status epilepticus: Secondary | ICD-10-CM | POA: Diagnosis not present

## 2017-10-18 DIAGNOSIS — Z820 Family history of epilepsy and other diseases of the nervous system: Secondary | ICD-10-CM

## 2017-10-18 DIAGNOSIS — Z8261 Family history of arthritis: Secondary | ICD-10-CM | POA: Diagnosis not present

## 2017-10-18 DIAGNOSIS — Z9911 Dependence on respirator [ventilator] status: Secondary | ICD-10-CM

## 2017-10-18 DIAGNOSIS — I129 Hypertensive chronic kidney disease with stage 1 through stage 4 chronic kidney disease, or unspecified chronic kidney disease: Secondary | ICD-10-CM | POA: Diagnosis not present

## 2017-10-18 DIAGNOSIS — Z87891 Personal history of nicotine dependence: Secondary | ICD-10-CM

## 2017-10-18 DIAGNOSIS — E872 Acidosis: Secondary | ICD-10-CM | POA: Diagnosis present

## 2017-10-18 DIAGNOSIS — E162 Hypoglycemia, unspecified: Secondary | ICD-10-CM | POA: Diagnosis present

## 2017-10-18 DIAGNOSIS — I313 Pericardial effusion (noninflammatory): Secondary | ICD-10-CM | POA: Diagnosis not present

## 2017-10-18 DIAGNOSIS — F319 Bipolar disorder, unspecified: Secondary | ICD-10-CM | POA: Diagnosis not present

## 2017-10-18 DIAGNOSIS — J9600 Acute respiratory failure, unspecified whether with hypoxia or hypercapnia: Secondary | ICD-10-CM

## 2017-10-18 DIAGNOSIS — R6521 Severe sepsis with septic shock: Secondary | ICD-10-CM | POA: Diagnosis present

## 2017-10-18 DIAGNOSIS — F209 Schizophrenia, unspecified: Secondary | ICD-10-CM | POA: Diagnosis not present

## 2017-10-18 DIAGNOSIS — D65 Disseminated intravascular coagulation [defibrination syndrome]: Secondary | ICD-10-CM | POA: Diagnosis not present

## 2017-10-18 DIAGNOSIS — G92 Toxic encephalopathy: Secondary | ICD-10-CM | POA: Diagnosis present

## 2017-10-18 DIAGNOSIS — N184 Chronic kidney disease, stage 4 (severe): Secondary | ICD-10-CM | POA: Diagnosis present

## 2017-10-18 DIAGNOSIS — Z452 Encounter for adjustment and management of vascular access device: Secondary | ICD-10-CM

## 2017-10-18 DIAGNOSIS — R569 Unspecified convulsions: Secondary | ICD-10-CM | POA: Diagnosis not present

## 2017-10-18 DIAGNOSIS — R34 Anuria and oliguria: Secondary | ICD-10-CM | POA: Diagnosis not present

## 2017-10-18 DIAGNOSIS — K7200 Acute and subacute hepatic failure without coma: Secondary | ICD-10-CM | POA: Diagnosis present

## 2017-10-18 DIAGNOSIS — N170 Acute kidney failure with tubular necrosis: Secondary | ICD-10-CM | POA: Diagnosis not present

## 2017-10-18 DIAGNOSIS — E8770 Fluid overload, unspecified: Secondary | ICD-10-CM | POA: Diagnosis not present

## 2017-10-18 DIAGNOSIS — R040 Epistaxis: Secondary | ICD-10-CM | POA: Diagnosis present

## 2017-10-18 DIAGNOSIS — R509 Fever, unspecified: Secondary | ICD-10-CM

## 2017-10-18 DIAGNOSIS — J9690 Respiratory failure, unspecified, unspecified whether with hypoxia or hypercapnia: Secondary | ICD-10-CM

## 2017-10-18 DIAGNOSIS — Z79899 Other long term (current) drug therapy: Secondary | ICD-10-CM

## 2017-10-18 DIAGNOSIS — R400 Somnolence: Secondary | ICD-10-CM

## 2017-10-18 DIAGNOSIS — Z87820 Personal history of traumatic brain injury: Secondary | ICD-10-CM

## 2017-10-18 DIAGNOSIS — Z978 Presence of other specified devices: Secondary | ICD-10-CM

## 2017-10-18 DIAGNOSIS — J9601 Acute respiratory failure with hypoxia: Secondary | ICD-10-CM | POA: Diagnosis present

## 2017-10-18 DIAGNOSIS — N179 Acute kidney failure, unspecified: Secondary | ICD-10-CM

## 2017-10-18 DIAGNOSIS — G8114 Spastic hemiplegia affecting left nondominant side: Secondary | ICD-10-CM | POA: Diagnosis present

## 2017-10-18 DIAGNOSIS — N19 Unspecified kidney failure: Secondary | ICD-10-CM

## 2017-10-18 DIAGNOSIS — E876 Hypokalemia: Secondary | ICD-10-CM | POA: Diagnosis present

## 2017-10-18 HISTORY — DX: Other specified health status: Z78.9

## 2017-10-18 HISTORY — DX: Irritability and anger: R45.4

## 2017-10-18 HISTORY — DX: Problems related to living in residential institution: Z59.3

## 2017-10-18 HISTORY — DX: Spastic hemiplegia affecting left nondominant side: G81.14

## 2017-10-18 LAB — TROPONIN I
Troponin I: 2.67 ng/mL (ref <0.03)
Troponin I: 2.52 ng/mL (ref <0.03)

## 2017-10-18 LAB — BLOOD GAS, ARTERIAL
Acid-base deficit: 15.3 mmol/L — ABNORMAL HIGH (ref 0.0–2.0)
Bicarbonate: 12.8 mmol/L — ABNORMAL LOW (ref 20.0–28.0)
Drawn by: 270161
FIO2: 100
MECHVT: 600 mL
O2 Saturation: 99.1 %
PATIENT TEMPERATURE: 38.1
PCO2 ART: 26 mmHg — AB (ref 32.0–48.0)
PEEP: 5 cmH2O
RATE: 18 resp/min
pH, Arterial: 7.236 — ABNORMAL LOW (ref 7.350–7.450)
pO2, Arterial: 464 mmHg — ABNORMAL HIGH (ref 83.0–108.0)

## 2017-10-18 LAB — CBC WITH DIFFERENTIAL/PLATELET
BASOS PCT: 0 %
Basophils Absolute: 0 10*3/uL (ref 0.0–0.1)
EOS ABS: 0 10*3/uL (ref 0.0–0.7)
Eosinophils Relative: 0 %
HCT: 42 % (ref 39.0–52.0)
Hemoglobin: 14.3 g/dL (ref 13.0–17.0)
LYMPHS ABS: 4.3 10*3/uL — AB (ref 0.7–4.0)
Lymphocytes Relative: 19 %
MCH: 31.8 pg (ref 26.0–34.0)
MCHC: 34 g/dL (ref 30.0–36.0)
MCV: 93.5 fL (ref 78.0–100.0)
MONO ABS: 1.8 10*3/uL — AB (ref 0.1–1.0)
Monocytes Relative: 8 %
NEUTROS PCT: 73 %
Neutro Abs: 16.3 10*3/uL — ABNORMAL HIGH (ref 1.7–7.7)
PLATELETS: 119 10*3/uL — AB (ref 150–400)
RBC: 4.49 MIL/uL (ref 4.22–5.81)
RDW: 13.9 % (ref 11.5–15.5)
WBC: 22.4 10*3/uL — ABNORMAL HIGH (ref 4.0–10.5)

## 2017-10-18 LAB — COMPREHENSIVE METABOLIC PANEL
ALT: 188 U/L — AB (ref 0–44)
AST: 360 U/L — AB (ref 15–41)
Albumin: 3.5 g/dL (ref 3.5–5.0)
Alkaline Phosphatase: 62 U/L (ref 38–126)
BUN: 38 mg/dL — AB (ref 6–20)
CO2: 9 mmol/L — AB (ref 22–32)
CREATININE: 5.8 mg/dL — AB (ref 0.61–1.24)
Calcium: 9.2 mg/dL (ref 8.9–10.3)
Chloride: 110 mmol/L (ref 98–111)
GFR calc Af Amer: 13 mL/min — ABNORMAL LOW (ref >60)
GFR, EST NON AFRICAN AMERICAN: 11 mL/min — AB (ref >60)
Glucose, Bld: 137 mg/dL — ABNORMAL HIGH (ref 70–99)
Potassium: 2.8 mmol/L — ABNORMAL LOW (ref 3.5–5.1)
Sodium: 144 mmol/L (ref 135–145)
Total Bilirubin: 0.8 mg/dL (ref 0.3–1.2)
Total Protein: 7.3 g/dL (ref 6.5–8.1)

## 2017-10-18 LAB — BASIC METABOLIC PANEL
Anion gap: 15 (ref 5–15)
BUN: 34 mg/dL — ABNORMAL HIGH (ref 6–20)
CO2: 10 mmol/L — AB (ref 22–32)
Calcium: 5.9 mg/dL — CL (ref 8.9–10.3)
Chloride: 117 mmol/L — ABNORMAL HIGH (ref 98–111)
Creatinine, Ser: 5.22 mg/dL — ABNORMAL HIGH (ref 0.61–1.24)
GFR calc non Af Amer: 12 mL/min — ABNORMAL LOW (ref >60)
GFR, EST AFRICAN AMERICAN: 14 mL/min — AB (ref >60)
GLUCOSE: 127 mg/dL — AB (ref 70–99)
Potassium: 3.2 mmol/L — ABNORMAL LOW (ref 3.5–5.1)
Sodium: 142 mmol/L (ref 135–145)

## 2017-10-18 LAB — CSF CELL COUNT WITH DIFFERENTIAL
RBC Count, CSF: 0 /mm3
TUBE #: 1
WBC, CSF: 1 /mm3 (ref 0–5)

## 2017-10-18 LAB — POCT I-STAT 3, ART BLOOD GAS (G3+)
ACID-BASE DEFICIT: 18 mmol/L — AB (ref 0.0–2.0)
ACID-BASE DEFICIT: 18 mmol/L — AB (ref 0.0–2.0)
BICARBONATE: 9.7 mmol/L — AB (ref 20.0–28.0)
Bicarbonate: 10 mmol/L — ABNORMAL LOW (ref 20.0–28.0)
O2 SAT: 99 %
O2 Saturation: 99 %
PCO2 ART: 27.6 mmHg — AB (ref 32.0–48.0)
PH ART: 7.174 — AB (ref 7.350–7.450)
Patient temperature: 35.7
TCO2: 11 mmol/L — AB (ref 22–32)
TCO2: 11 mmol/L — AB (ref 22–32)
pCO2 arterial: 25.9 mmHg — ABNORMAL LOW (ref 32.0–48.0)
pH, Arterial: 7.158 — CL (ref 7.350–7.450)
pO2, Arterial: 192 mmHg — ABNORMAL HIGH (ref 83.0–108.0)
pO2, Arterial: 176 mmHg — ABNORMAL HIGH (ref 83.0–108.0)

## 2017-10-18 LAB — LACTIC ACID, PLASMA
Lactic Acid, Venous: 9.3 mmol/L (ref 0.5–1.9)
Lactic Acid, Venous: 7.6 mmol/L (ref 0.5–1.9)
Lactic Acid, Venous: 8.6 mmol/L (ref 0.5–1.9)

## 2017-10-18 LAB — I-STAT CHEM 8, ED
BUN: 35 mg/dL — ABNORMAL HIGH (ref 6–20)
Calcium, Ion: 1.12 mmol/L — ABNORMAL LOW (ref 1.15–1.40)
Chloride: 112 mmol/L — ABNORMAL HIGH (ref 98–111)
Creatinine, Ser: 5.5 mg/dL — ABNORMAL HIGH (ref 0.61–1.24)
GLUCOSE: 130 mg/dL — AB (ref 70–99)
HCT: 44 % (ref 39.0–52.0)
HEMOGLOBIN: 15 g/dL (ref 13.0–17.0)
POTASSIUM: 2.8 mmol/L — AB (ref 3.5–5.1)
Sodium: 145 mmol/L (ref 135–145)
TCO2: 11 mmol/L — ABNORMAL LOW (ref 22–32)

## 2017-10-18 LAB — URINALYSIS, ROUTINE W REFLEX MICROSCOPIC
Bilirubin Urine: NEGATIVE
GLUCOSE, UA: NEGATIVE mg/dL
Ketones, ur: NEGATIVE mg/dL
LEUKOCYTES UA: NEGATIVE
NITRITE: NEGATIVE
PH: 5 (ref 5.0–8.0)
Protein, ur: 100 mg/dL — AB
SPECIFIC GRAVITY, URINE: 1.019 (ref 1.005–1.030)

## 2017-10-18 LAB — LIPASE, BLOOD: LIPASE: 38 U/L (ref 11–51)

## 2017-10-18 LAB — GLUCOSE, CAPILLARY
Glucose-Capillary: 81 mg/dL (ref 70–99)
Glucose-Capillary: 92 mg/dL (ref 70–99)
Glucose-Capillary: 81 mg/dL (ref 70–99)

## 2017-10-18 LAB — CK: Total CK: 3641 U/L — ABNORMAL HIGH (ref 49–397)

## 2017-10-18 LAB — PROTEIN AND GLUCOSE, CSF
Glucose, CSF: 57 mg/dL (ref 40–70)
Total  Protein, CSF: 47 mg/dL — ABNORMAL HIGH (ref 15–45)

## 2017-10-18 LAB — CORTISOL: CORTISOL PLASMA: 44.2 ug/dL

## 2017-10-18 LAB — MAGNESIUM
Magnesium: 1.8 mg/dL (ref 1.7–2.4)
Magnesium: 1.4 mg/dL — ABNORMAL LOW (ref 1.7–2.4)

## 2017-10-18 LAB — CBG MONITORING, ED: GLUCOSE-CAPILLARY: 110 mg/dL — AB (ref 70–99)

## 2017-10-18 LAB — PHOSPHORUS: PHOSPHORUS: 2.3 mg/dL — AB (ref 2.5–4.6)

## 2017-10-18 LAB — PROCALCITONIN: Procalcitonin: 10.62 ng/mL

## 2017-10-18 LAB — TRIGLYCERIDES: Triglycerides: 40 mg/dL (ref <150)

## 2017-10-18 LAB — VALPROIC ACID LEVEL: Valproic Acid Lvl: 61 ug/mL (ref 50.0–100.0)

## 2017-10-18 MED ORDER — SODIUM CHLORIDE 0.9 % IV SOLN
0.0000 ug/h | INTRAVENOUS | Status: DC
  Filled 2017-10-18: qty 50

## 2017-10-18 MED ORDER — ETOMIDATE 2 MG/ML IV SOLN
20.0000 mg | Freq: Once | INTRAVENOUS | Status: AC
  Administered 2017-10-18: 20 mg via INTRAVENOUS

## 2017-10-18 MED ORDER — DEXTROSE 5 % IV SOLN
INTRAVENOUS | Status: DC
  Administered 2017-10-18 – 2017-10-20 (×5): via INTRAVENOUS
  Filled 2017-10-18 (×10): qty 150

## 2017-10-18 MED ORDER — HYDROCORTISONE NA SUCCINATE PF 100 MG IJ SOLR
50.0000 mg | Freq: Four times a day (QID) | INTRAMUSCULAR | Status: DC
  Administered 2017-10-18 – 2017-10-23 (×18): 50 mg via INTRAVENOUS
  Filled 2017-10-18 (×18): qty 2

## 2017-10-18 MED ORDER — MIDAZOLAM HCL 2 MG/2ML IJ SOLN: 2.0000 mg | Freq: Once | INTRAMUSCULAR | Status: DC

## 2017-10-18 MED ORDER — CHLORHEXIDINE GLUCONATE 0.12% ORAL RINSE (MEDLINE KIT)
15.0000 mL | Freq: Two times a day (BID) | OROMUCOSAL | Status: DC
  Administered 2017-10-18 – 2017-10-22 (×9): 15 mL via OROMUCOSAL

## 2017-10-18 MED ORDER — POTASSIUM CHLORIDE 10 MEQ/100ML IV SOLN
INTRAVENOUS | Status: AC
  Filled 2017-10-18: qty 100

## 2017-10-18 MED ORDER — PHENYLEPHRINE HCL 1 % NA SOLN
NASAL | Status: AC
  Filled 2017-10-18: qty 15

## 2017-10-18 MED ORDER — MIDAZOLAM HCL 2 MG/2ML IJ SOLN
2.0000 mg | Freq: Once | INTRAMUSCULAR | Status: AC
  Administered 2017-10-18: 2 mg via INTRAVENOUS

## 2017-10-18 MED ORDER — FENTANYL CITRATE (PF) 100 MCG/2ML IJ SOLN: 100.0000 ug | INTRAMUSCULAR | Status: DC | PRN

## 2017-10-18 MED ORDER — PHENYLEPHRINE HCL-NACL 10-0.9 MG/250ML-% IV SOLN
0.0000 ug/min | INTRAVENOUS | Status: DC
  Filled 2017-10-18: qty 250

## 2017-10-18 MED ORDER — FAMOTIDINE IN NACL 20-0.9 MG/50ML-% IV SOLN
20.0000 mg | INTRAVENOUS | Status: DC
  Administered 2017-10-19 – 2017-10-22 (×4): 20 mg via INTRAVENOUS
  Filled 2017-10-18 (×4): qty 50

## 2017-10-18 MED ORDER — SODIUM CHLORIDE 0.9 % IV BOLUS (SEPSIS)
1000.0000 mL | Freq: Once | INTRAVENOUS | Status: AC
  Administered 2017-10-18: 1000 mL via INTRAVENOUS

## 2017-10-18 MED ORDER — SODIUM CHLORIDE 0.9 % IV BOLUS
1000.0000 mL | Freq: Once | INTRAVENOUS | Status: AC
  Administered 2017-10-18: 1000 mL via INTRAVENOUS

## 2017-10-18 MED ORDER — VALPROATE SODIUM 500 MG/5ML IV SOLN
500.0000 mg | Freq: Three times a day (TID) | INTRAVENOUS | Status: DC
  Administered 2017-10-18 – 2017-10-21 (×8): 500 mg via INTRAVENOUS
  Filled 2017-10-18 (×9): qty 5

## 2017-10-18 MED ORDER — SODIUM CHLORIDE 0.9 % IV BOLUS: 30.0000 mL/kg | Freq: Once | INTRAVENOUS | Status: DC

## 2017-10-18 MED ORDER — VANCOMYCIN HCL IN DEXTROSE 1-5 GM/200ML-% IV SOLN: 1000.0000 mg | Freq: Once | INTRAVENOUS | Status: DC

## 2017-10-18 MED ORDER — SODIUM CHLORIDE 0.9 % IV SOLN
INTRAVENOUS | Status: DC | PRN
  Administered 2017-10-18 – 2017-10-20 (×3): via INTRAVENOUS

## 2017-10-18 MED ORDER — MIDAZOLAM HCL 5 MG/5ML IJ SOLN
INTRAMUSCULAR | Status: AC
  Filled 2017-10-18: qty 5

## 2017-10-18 MED ORDER — SODIUM CHLORIDE 0.9 % IV SOLN
150.0000 mg | Freq: Two times a day (BID) | INTRAVENOUS | Status: DC
  Administered 2017-10-18 – 2017-10-22 (×8): 150 mg via INTRAVENOUS
  Filled 2017-10-18 (×12): qty 15

## 2017-10-18 MED ORDER — LEVETIRACETAM IN NACL 500 MG/100ML IV SOLN: 500.0000 mg | Freq: Two times a day (BID) | INTRAVENOUS | Status: DC

## 2017-10-18 MED ORDER — SODIUM CHLORIDE 0.9 % IV SOLN
2.0000 g | Freq: Two times a day (BID) | INTRAVENOUS | Status: DC
  Administered 2017-10-18 – 2017-10-20 (×4): 2 g via INTRAVENOUS
  Filled 2017-10-18 (×6): qty 20

## 2017-10-18 MED ORDER — NOREPINEPHRINE 16 MG/250ML-% IV SOLN
5.0000 ug/min | INTRAVENOUS | Status: DC
  Administered 2017-10-18: 28 ug/min via INTRAVENOUS
  Administered 2017-10-19 (×2): 40 ug/min via INTRAVENOUS
  Administered 2017-10-20: 35 ug/min via INTRAVENOUS
  Administered 2017-10-20: 20.053 ug/min via INTRAVENOUS
  Administered 2017-10-20: 20 ug/min via INTRAVENOUS
  Administered 2017-10-21: 50 ug/min via INTRAVENOUS
  Administered 2017-10-21: 40 ug/min via INTRAVENOUS
  Administered 2017-10-21 – 2017-10-22 (×2): 35 ug/min via INTRAVENOUS
  Administered 2017-10-22: 40 ug/min via INTRAVENOUS
  Administered 2017-10-22: 45 ug/min via INTRAVENOUS
  Administered 2017-10-22: 38 ug/min via INTRAVENOUS
  Filled 2017-10-18 (×14): qty 250

## 2017-10-18 MED ORDER — SODIUM CHLORIDE 0.9 % IV SOLN
1000.0000 mL | INTRAVENOUS | Status: DC
  Administered 2017-10-18: 1000 mL via INTRAVENOUS

## 2017-10-18 MED ORDER — SODIUM CHLORIDE 0.9 % IV SOLN: INTRAVENOUS | Status: DC

## 2017-10-18 MED ORDER — ORAL CARE MOUTH RINSE
15.0000 mL | OROMUCOSAL | Status: DC
  Administered 2017-10-18 – 2017-10-23 (×44): 15 mL via OROMUCOSAL

## 2017-10-18 MED ORDER — PROPOFOL 1000 MG/100ML IV EMUL
20.0000 ug/kg/min | INTRAVENOUS | Status: DC
  Administered 2017-10-18: 30 ug/kg/min via INTRAVENOUS
  Administered 2017-10-19: 10 ug/kg/min via INTRAVENOUS
  Filled 2017-10-18 (×2): qty 100

## 2017-10-18 MED ORDER — PIPERACILLIN-TAZOBACTAM 3.375 G IVPB 30 MIN
3.3750 g | Freq: Once | INTRAVENOUS | Status: AC
  Administered 2017-10-18: 3.375 g via INTRAVENOUS
  Filled 2017-10-18: qty 50

## 2017-10-18 MED ORDER — FENTANYL CITRATE (PF) 100 MCG/2ML IJ SOLN
50.0000 ug | Freq: Once | INTRAMUSCULAR | Status: AC
  Administered 2017-10-18: 50 ug via INTRAVENOUS

## 2017-10-18 MED ORDER — VANCOMYCIN HCL 10 G IV SOLR
1500.0000 mg | Freq: Once | INTRAVENOUS | Status: AC
  Administered 2017-10-18: 1500 mg via INTRAVENOUS
  Filled 2017-10-18: qty 1500

## 2017-10-18 MED ORDER — FENTANYL BOLUS VIA INFUSION
50.0000 ug | INTRAVENOUS | Status: DC | PRN
  Filled 2017-10-18: qty 50

## 2017-10-18 MED ORDER — FENTANYL 2500MCG IN NS 250ML (10MCG/ML) PREMIX INFUSION: 25.0000 ug/h | INTRAVENOUS | Status: DC

## 2017-10-18 MED ORDER — ACETAMINOPHEN 650 MG RE SUPP
650.0000 mg | Freq: Once | RECTAL | Status: AC
  Administered 2017-10-18: 650 mg via RECTAL
  Filled 2017-10-18: qty 1

## 2017-10-18 MED ORDER — DEXTROSE 5 % IV SOLN
750.0000 mg | INTRAVENOUS | Status: DC
  Administered 2017-10-18: 750 mg via INTRAVENOUS
  Filled 2017-10-18: qty 15

## 2017-10-18 MED ORDER — FENTANYL CITRATE (PF) 100 MCG/2ML IJ SOLN
INTRAMUSCULAR | Status: AC
  Filled 2017-10-18: qty 4

## 2017-10-18 MED ORDER — NOREPINEPHRINE 4 MG/250ML-% IV SOLN
5.0000 ug/min | INTRAVENOUS | Status: DC
  Administered 2017-10-18: 18 ug/min via INTRAVENOUS
  Administered 2017-10-18: 28 ug/min via INTRAVENOUS
  Filled 2017-10-18: qty 250

## 2017-10-18 MED ORDER — FENTANYL CITRATE (PF) 100 MCG/2ML IJ SOLN: 25.0000 ug | Freq: Once | INTRAMUSCULAR | Status: DC

## 2017-10-18 MED ORDER — PIPERACILLIN-TAZOBACTAM 3.375 G IVPB: 3.3750 g | Freq: Two times a day (BID) | INTRAVENOUS | Status: DC

## 2017-10-18 MED ORDER — LEVETIRACETAM IN NACL 500 MG/100ML IV SOLN
500.0000 mg | Freq: Two times a day (BID) | INTRAVENOUS | Status: DC
  Administered 2017-10-18 – 2017-10-21 (×7): 500 mg via INTRAVENOUS
  Filled 2017-10-18 (×8): qty 100

## 2017-10-18 MED ORDER — LEVETIRACETAM IN NACL 1000 MG/100ML IV SOLN
1000.0000 mg | Freq: Once | INTRAVENOUS | Status: AC
  Administered 2017-10-18: 1000 mg via INTRAVENOUS
  Filled 2017-10-18: qty 100

## 2017-10-18 MED ORDER — SODIUM CHLORIDE 0.9 % IV SOLN
250.0000 mL | INTRAVENOUS | Status: DC | PRN
  Administered 2017-10-18: 250 mL via INTRAVENOUS

## 2017-10-18 MED ORDER — VANCOMYCIN VARIABLE DOSE PER UNSTABLE RENAL FUNCTION (PHARMACIST DOSING): Status: DC

## 2017-10-18 MED ORDER — CLONAZEPAM 0.5 MG PO TABS: 0.5000 mg | ORAL_TABLET | Freq: Two times a day (BID) | ORAL | Status: DC | PRN

## 2017-10-18 MED ORDER — SODIUM CHLORIDE 0.9 % IV SOLN
1.0000 g | Freq: Once | INTRAVENOUS | Status: AC
  Administered 2017-10-19: 1 g via INTRAVENOUS
  Filled 2017-10-18: qty 10

## 2017-10-18 MED ORDER — VASOPRESSIN 20 UNIT/ML IV SOLN
0.0100 [IU]/min | INTRAVENOUS | Status: DC
  Administered 2017-10-18: 0.03 [IU]/min via INTRAVENOUS
  Administered 2017-10-19: 0.02 [IU]/min via INTRAVENOUS
  Filled 2017-10-18 (×2): qty 2

## 2017-10-18 MED ORDER — NOREPINEPHRINE 4 MG/250ML-% IV SOLN
INTRAVENOUS | Status: AC
  Administered 2017-10-18: 18 ug/min via INTRAVENOUS
  Filled 2017-10-18: qty 250

## 2017-10-18 MED ORDER — ROCURONIUM BROMIDE 50 MG/5ML IV SOLN
1.0000 mg/kg | Freq: Once | INTRAVENOUS | Status: DC
  Filled 2017-10-18: qty 8.95

## 2017-10-18 MED ORDER — POTASSIUM CHLORIDE 10 MEQ/50ML IV SOLN
10.0000 meq | INTRAVENOUS | Status: AC
  Administered 2017-10-18 (×4): 10 meq via INTRAVENOUS
  Filled 2017-10-18 (×4): qty 50

## 2017-10-18 MED ORDER — VALPROATE SODIUM 500 MG/5ML IV SOLN
500.0000 mg | Freq: Two times a day (BID) | INTRAVENOUS | Status: DC
  Administered 2017-10-18: 500 mg via INTRAVENOUS
  Filled 2017-10-18: qty 5

## 2017-10-18 MED ORDER — SODIUM CHLORIDE 0.9 % IV SOLN
500.0000 mL | INTRAVENOUS | Status: DC | PRN
  Administered 2017-10-18 (×2): 500 mL via INTRAVENOUS

## 2017-10-18 MED ORDER — DEXTROSE 50 % IV SOLN
INTRAVENOUS | Status: AC
  Filled 2017-10-18: qty 50

## 2017-10-18 MED ORDER — LEVETIRACETAM IN NACL 1500 MG/100ML IV SOLN: 1500.0000 mg | Freq: Two times a day (BID) | INTRAVENOUS | Status: DC

## 2017-10-18 NOTE — ED Triage Notes (Signed)
Pt is a resident of [facility].  Reports staff found pt unresponsive , increased resp rate, cbg 35.  EMS reports pt was 91% on room air, bp 60/52,  140 hr.  EMS gave glucagon, cbg dropped to 29.  Ems gave 25g d50 and sugar increased to 160 and hr decreased to 120.  Bp increased to 90 systolic.   Pt arrived, mouth bleeding, increased resp rate, moaning.  Pt had large amount of diarrhea stool upon arrival.  Pt cleaned.  Rectal temp 106.5

## 2017-10-18 NOTE — Progress Notes (Signed)
Critical ABG values given to B. Tanja PortBabcock, NP.  RT changed to 28 per NP order.

## 2017-10-18 NOTE — ED Notes (Signed)
Upon arrival of [facility], CBG was checked and pt's glucose read low. 1 amp of D50 given per order of Dr. Clarene DukeMcManus. CBG now 110. [facility] to hang D50 NS. Potassium 10mEq also started in route with [facility].

## 2017-10-18 NOTE — Progress Notes (Signed)
LTM hooked up - no initial skin breakdown. 

## 2017-10-18 NOTE — Procedures (Signed)
History: 42 year old with seizure disorder with altered mental status  Sedation: Propofol  Technique: This is a 21 channel routine scalp EEG performed at the bedside with bipolar and monopolar montages arranged in accordance to the international 10/20 system of electrode placement. One channel was dedicated to EKG recording.    Background: Throughout the majority of the study, there is significant muscle artifact obscuring the background.  During the brief periods of quiescence, there does not seem to be any ongoing seizure activity, but this is a limited EEG.  Photic stimulation: Physiologic driving is not performed  EEG Abnormalities: Extensive muscle artifact  Clinical Interpretation: This EEG was severely limited by muscle artifact, there was no definite seizure activity evidenced during periods of quiescence.   Glen SlotMcNeill Tine Mabee, MD [facility] [phone]  If 7pm- 7am, please page neurology on call as listed in AMION.

## 2017-10-18 NOTE — Progress Notes (Signed)
RT NOTE:  Critical ABG results reported to Weston BrassNick, Charity fundraiserN. RN reported to MD. No RT intervention at this time.

## 2017-10-18 NOTE — ED Notes (Signed)
CRITICAL VALUE ALERT  Critical Value:  Lactic acid 9.3, troponin 2.67  Date & Time Notied:  11/05/2017. [id number]  Provider Notified: Dr. Clarene DukeMcManus  Orders Received/Actions taken: see chart

## 2017-10-18 NOTE — ED Notes (Signed)
Given report to [facility]  

## 2017-10-18 NOTE — ED Provider Notes (Signed)
[facility] EMERGENCY DEPARTMENT Provider Note   CSN: [id number] Arrival date & time: 10/20/2017  0830     History   Chief Complaint Chief Complaint  Patient presents with  . Fever  . Altered Mental Status    HPI Glen Johnson is a 42 y.o. male.  The history is provided by the EMS personnel and a caregiver. The history is limited by the condition of the patient (AMS).  Fever    Altered Mental Status    Pt was seen at 0830. Per EMS and Group Home Staff: Pt found unresponsive in bed by group home staff this morning. Last seen last night. EMS states pt's "CBG was 35, BP 60/52, HR 140, temp 102, O2 Sats very low" on their arrival to scene. EMS states they gave glucacon with CBG "dropping to 29." EMS then gave IV D50, with CBG "increasing to 160, HR decreasing to 120, and SPB increasing to 90." On arrival, pt had large yellow diarrheal stool, moaning, dark dried blood in mouth.     Past Medical History:  Diagnosis Date  . Bipolar disorder (HCC)   . Depression   . Hypertension   . Left spastic hemiparesis (HCC)   . Lives in group home   . Outbursts of anger   . Renal disorder    chronic kidney disease, stage II  . Schizophrenia (HCC)   . Seizures (HCC)   . TBI (traumatic brain injury) (HCC) 2012   unsteady gait, speech difficulty    Patient Active Problem List   Diagnosis Date Noted  . 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 Dehydration 09/14/2015  . AKI (acute kidney injury) (HCC) 09/14/2015  . Lactic acidosis 09/14/2015  . Schizophrenia (HCC)   . Seizures (HCC) 02/18/2015  . Abnormality of gait 02/17/2015  . Schizophrenia, unspecified type (HCC)   . Involuntary commitment   . Violent behavior   . 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 TRACHEOSTOMY    . TRACHEOSTOMY CLOSURE          Home Medications    Prior to Admission  medications   Medication Sig Start Date End Date Taking? Authorizing Provider  amLODipine (NORVASC) 10 MG tablet Take 1 tablet (10 mg total) by mouth daily. 09/04/15   Charm Rings, NP  busPIRone (BUSPAR) 5 MG tablet Take 1 tablet (5 mg total) by mouth 3 (three) times daily. Patient taking differently: Take 10 mg by mouth 3 (three) times daily.  09/25/15   Vassie Loll, MD  clonazePAM (KLONOPIN) 0.5 MG tablet Take 1 tablet (0.5 mg total) by mouth 2 (two) times daily as needed (anxiety). 09/25/15   Vassie Loll, MD  divalproex (DEPAKOTE ER) 500 MG 24 hr tablet One in the morning, 2 tabs at night 07/28/17   Levert Feinstein, MD  glycopyrrolate (ROBINUL) 2 MG tablet Take 1 tablet (2 mg total) by mouth 3 (three) times daily. 09/04/15   Charm Rings, NP  levETIRAcetam (KEPPRA) 500 MG tablet Take 1 tablet (500 mg total) by mouth 2 (two) times daily. 07/28/17   Levert Feinstein, MD  Lurasidone HCl 60 MG TABS Take 60 mg by mouth at bedtime. 09/25/15   Vassie Loll, MD  metoprolol tartrate (LOPRESSOR) 25 MG tablet Take 1 tablet (25 mg total) by mouth 2 (two) times daily. 09/25/15   Vassie Loll, MD  mirtazapine (REMERON) 15 MG tablet Take 1 tablet (15  mg total) by mouth at bedtime. 09/04/15   Charm Rings, NP  ondansetron (ZOFRAN ODT) 4 MG disintegrating tablet Take 1 tablet (4 mg total) by mouth every 8 (eight) hours as needed for nausea. 06/01/17   Eber Hong, MD    Family History Family History  Problem Relation Age of Onset  . Seizures Mother   . Migraines Mother   . Arthritis Mother     Social History Social History   Tobacco Use  . Smoking status: Former Games developer  . Smokeless tobacco: Never Used  Substance Use Topics  . Alcohol use: No    Alcohol/week: 0.0 oz    Comment: Stopped one year ago.  . Drug use: No    Comment: Stopped one year ago.     Allergies   Patient has no known allergies.   Review of Systems Review of Systems  Unable to perform ROS: Mental status change    Constitutional: Positive for fever.     Physical Exam Updated Vital Signs BP (!) 89/57 (BP Location: Right Arm)   Pulse (!) 128   Temp (!) 106.5 F (41.4 C) (Rectal)   Resp (!) 48   Wt 86.2 kg (190 lb) Comment: pulled from previoius visit  SpO2 91%   BMI 26.50 kg/m    Patient Vitals for the past 24 hrs:  BP Temp Temp src Pulse Resp SpO2 Height Weight  10/28/2017 1230 (!) 101/55 97.9 F (36.6 C) - 99 17 100 % - -  10/19/2017 1215 (!) 95/55 - - - - - - -  10/17/2017 1210 (!) 96/55 98.4 F (36.9 C) - 98 17 100 % - -  10/29/2017 1200 (!) 91/58 99 F (37.2 C) - 96 (!) 24 100 % - -  10/26/2017 1130 (!) 77/54 (!) 100.4 F (38 C) - 96 20 100 % - -  10/21/2017 1100 (!) 70/55 (!) 101.7 F (38.7 C) - (!) 102 (!) 22 100 % - -  10/29/2017 1045 (!) 75/47 (!) 102.4 F (39.1 C) - (!) 101 (!) 23 100 % - -  11/08/2017 1030 (!) 67/47 (!) 102.9 F (39.4 C) - (!) 104 (!) 27 100 % - -  11/08/2017 1000 (!) 74/50 (!) 103.6 F (39.8 C) - (!) 108 (!) 24 100 % - -  10/22/2017 0936 92/71 (!) 104.4 F (40.2 C) Bladder (!) 122 (!) 47 100 % - -  10/17/2017 0933 - - - - - (!) 85 % - -  10/17/2017 0933 92/74 - - (!) 114 (!) 42 (!) 87 % - -  10/24/2017 0920 - (!) 104.5 F (40.3 C) - (!) 113 (!) 29 (!) 76 % 5\' 11"  (1.803 m) -  11/09/2017 0915 - (!) 104.5 F (40.3 C) - (!) 115 (!) 48 (!) 81 % - -  11/06/2017 0910 - (!) 104.2 F (40.1 C) - (!) 121 (!) 38 100 % - -  10/25/2017 0905 - (!) 103.6 F (39.8 C) - (!) 124 (!) 36 99 % - -  11/07/2017 0900 (!) 84/58 (!) 102.6 F (39.2 C) - (!) 125 (!) 53 99 % - -  10/28/2017 0853 - - - - - - - 86.2 kg (190 lb)  10/13/2017 0848 (!) 89/57 (!) 106.5 F (41.4 C) Rectal (!) 128 (!) 48 91 % - -     Physical Exam 0835: Physical examination:  Nursing notes reviewed; Vital signs and O2 SAT reviewed;  Constitutional: Well developed, Well nourished, In no acute distress; Head:  Normocephalic, atraumatic; Eyes: EOMI, 3mm sluggishly reactive, No scleral icterus; ENMT: Mouth and pharynx normal, Mucous  membranes very dry. +dried dark blood in mouth, on teeth and tongue. No epistaxis.; Neck: Supple, Full range of motion, No lymphadenopathy; Cardiovascular: Tachycardic rate and rhythm, No gallop; Respiratory: Breath sounds clear & equal bilaterally, No wheezes. Tachypneic. No access mm use, no retrax. Normal respiratory effort/excursion; Chest: No deformity, Movement normal; Abdomen: Soft, Nontender, Nondistended, Normal bowel sounds. +passed large yellow diarrheal stool.; Genitourinary: No CVA tenderness; Extremities: Peripheral pulses normal, No deformity, No edema, No calf edema or asymmetry.; Neuro: Eyes open, moaning and saying few slurred words, no facial droop. +gag. Minimally moves extremities on stretcher to painful stimuli..; Skin: Color normal, Warm, Dry.    ED Treatments / Results  Labs (all labs ordered are listed, but only abnormal results are displayed)   EKG EKG Interpretation  Date/Time:  Tuesday 10-25-17 09:11:10 EDT Ventricular Rate:  120 PR Interval:    QRS Duration: 81 QT Interval:  353 QTC Calculation: 499 R Axis:   68 Text Interpretation:  Sinus tachycardia Abnormal R-wave progression, early transition Borderline repolarization abnormality Borderline prolonged QT interval Baseline wander Artifact When compared with ECG of 09/13/2015 Rate faster Confirmed by Samuel Jester [phone]) on 2017-10-25 10:13:48 AM   Radiology   Procedures Procedures (including critical care time)   Airway procedure:  Timeout: Pre-procedure timeout not performed due to emergent nature of procedure; Indication: Unresponsive;  Oxygen Saturation: 100 %; Oxygen concentration: 100 %; Preoxygenation: Bag-valve-mask;  Medication: Versed, Etomidate; Procedure: Suctioning, RSI, Glidescope laryngoscopy, Endotracheal intubation with 7.30mm cuffed endotracheal tube, Bag-valve-tube ventilation, Mechanical ventilation;  Reassessment: Successful intubation, +Copious amount of dried blood in mouth  and posterior pharynx. No fresh bleeding or obvious trauma in oral cavity or posterior pharynx. Teeth intact, without obvious trauma. Breath sounds equal bilaterally, No breath sounds heard over stomach, Chest movement symmetrical, CO2 detector color change, Endotracheal tube fogging, Oxygen saturation normal. Post-procedure xray obtained.   ENT/Dental procedure:  Timeout: Pre-procedural timeout; Indication: Epistaxis left nares;  Procedure: Pt intubated/consent implied, Topical/nasal neosynephrine applied, Placement of Rapid Rhino (7.5cm); Reassessment: Bleeding controlled, Airway patent, No respiratory distress.     Medications Ordered in ED Medications  0.9 %  sodium chloride infusion (has no administration in time range)  piperacillin-tazobactam (ZOSYN) IVPB 3.375 g (has no administration in time range)  sodium chloride 0.9 % bolus 1,000 mL (has no administration in time range)    And  sodium chloride 0.9 % bolus 1,000 mL (has no administration in time range)    And  sodium chloride 0.9 % bolus 1,000 mL (has no administration in time range)  vancomycin (VANCOCIN) 1,500 mg in sodium chloride 0.9 % 500 mL IVPB (has no administration in time range)  acetaminophen (TYLENOL) suppository 650 mg (has no administration in time range)     Initial Impression / Assessment and Plan / ED Course  I have reviewed the triage vital signs and the nursing notes.  Pertinent labs & imaging results that were available during my care of the patient were reviewed by me and considered in my medical decision making (see chart for details).  MDM Reviewed: previous chart, nursing note and vitals Reviewed previous: labs and ECG Interpretation: labs, ECG and x-ray Total time providing critical care: 75-105 minutes. This excludes time spent performing separately reportable procedures and services. Consults: neurology and critical care   CRITICAL CARE Performed by: Samuel Jester Total critical care  time: 100 minutes Critical care time  was exclusive of separately billable procedures and treating other patients. Critical care was necessary to treat or prevent imminent or life-threatening deterioration. Critical care was time spent personally by me on the following activities: development of treatment plan with patient and/or surrogate as well as nursing, discussions with consultants, evaluation of patient's response to treatment, examination of patient, obtaining history from patient or surrogate, ordering and performing treatments and interventions, ordering and review of laboratory studies, ordering and review of radiographic studies, pulse oximetry and re-evaluation of patient's condition.    Results for orders placed or performed during the hospital encounter of 11/04/2017  Blood Culture (routine x 2)  Result Value Ref Range   Specimen Description BLOOD RIGHT ARM    Special Requests      BOTTLES DRAWN AEROBIC AND ANAEROBIC Blood Culture adequate volume Performed at [facility], [address] [place], [place] [address]    Culture PENDING    Report Status PENDING   Blood Culture (routine x 2)  Result Value Ref Range   Specimen Description BLOOD LEFT HAND    Special Requests      BOTTLES DRAWN AEROBIC ONLY Blood Culture adequate volume Performed at [facility], [address] [place], [place] [address]    Culture PENDING    Report Status PENDING   Comprehensive metabolic panel  Result Value Ref Range   Sodium 144 135 - 145 mmol/L   Potassium 2.8 (L) 3.5 - 5.1 mmol/L   Chloride 110 98 - 111 mmol/L   CO2 9 (L) 22 - 32 mmol/L   Glucose, Bld 137 (H) 70 - 99 mg/dL   BUN 38 (H) 6 - 20 mg/dL   Creatinine, Ser 2.13 (H) 0.61 - 1.24 mg/dL   Calcium 9.2 8.9 - 08.6 mg/dL   Total Protein 7.3 6.5 - 8.1 g/dL   Albumin 3.5 3.5 - 5.0 g/dL   AST 578 (H) 15 - 41 U/L   ALT 188 (H) 0 - 44 U/L   Alkaline Phosphatase 62 38 - 126 U/L   Total Bilirubin 0.8 0.3 - 1.2 mg/dL   GFR calc non Af  Amer 11 (L) >60 mL/min   GFR calc Af Amer 13 (L) >60 mL/min  CBC WITH DIFFERENTIAL  Result Value Ref Range   WBC 22.4 (H) 4.0 - 10.5 K/uL   RBC 4.49 4.22 - 5.81 MIL/uL   Hemoglobin 14.3 13.0 - 17.0 g/dL   HCT 46.9 62.9 - 52.8 %   MCV 93.5 78.0 - 100.0 fL   MCH 31.8 26.0 - 34.0 pg   MCHC 34.0 30.0 - 36.0 g/dL   RDW 41.3 24.4 - 01.0 %   Platelets 119 (L) 150 - 400 K/uL   Neutrophils Relative % 73 %   Lymphocytes Relative 19 %   Monocytes Relative 8 %   Eosinophils Relative 0 %   Basophils Relative 0 %   Neutro Abs 16.3 (H) 1.7 - 7.7 K/uL   Lymphs Abs 4.3 (H) 0.7 - 4.0 K/uL   Monocytes Absolute 1.8 (H) 0.1 - 1.0 K/uL   Eosinophils Absolute 0.0 0.0 - 0.7 K/uL   Basophils Absolute 0.0 0.0 - 0.1 K/uL   RBC Morphology POLYCHROMASIA PRESENT    WBC Morphology MILD LEFT SHIFT (1-5% METAS, OCC MYELO, OCC BANDS)   Urinalysis, Routine w reflex microscopic  Result Value Ref Range   Color, Urine AMBER (A) YELLOW   APPearance CLOUDY (A) CLEAR   Specific Gravity, Urine 1.019 1.005 - 1.030   pH 5.0 5.0 -  8.0   Glucose, UA NEGATIVE NEGATIVE mg/dL   Hgb urine dipstick SMALL (A) NEGATIVE   Bilirubin Urine NEGATIVE NEGATIVE   Ketones, ur NEGATIVE NEGATIVE mg/dL   Protein, ur 956100 (A) NEGATIVE mg/dL   Nitrite NEGATIVE NEGATIVE   Leukocytes, UA NEGATIVE NEGATIVE   RBC / HPF 0-5 0 - 5 RBC/hpf   WBC, UA 0-5 0 - 5 WBC/hpf   Bacteria, UA RARE (A) NONE SEEN   Squamous Epithelial / LPF 0-5 0 - 5   Mucus PRESENT    Hyaline Casts, UA PRESENT   Lactic acid, plasma  Result Value Ref Range   Lactic Acid, Venous 9.3 (HH) 0.5 - 1.9 mmol/L  Lactic acid, plasma  Result Value Ref Range   Lactic Acid, Venous 7.6 (HH) 0.5 - 1.9 mmol/L  Valproic acid level  Result Value Ref Range   Valproic Acid Lvl 61 50.0 - 100.0 ug/mL  Troponin I  Result Value Ref Range   Troponin I 2.67 (HH) <0.03 ng/mL  Magnesium  Result Value Ref Range   Magnesium 1.8 1.7 - 2.4 mg/dL  CK  Result Value Ref Range   Total CK  3,641 (H) 49 - 397 U/L  Blood gas, arterial  Result Value Ref Range   FIO2 100.00    Delivery systems VENTILATOR    Mode PRESSURE REGULATED VOLUME CONTROL    VT 600 mL   LHR 18 resp/min   Peep/cpap 5.0 cm H20   pH, Arterial 7.236 (L) 7.350 - 7.450   pCO2 arterial 26.0 (L) 32.0 - 48.0 mmHg   pO2, Arterial 464 (H) 83.0 - 108.0 mmHg   Bicarbonate 12.8 (L) 20.0 - 28.0 mmol/L   Acid-base deficit 15.3 (H) 0.0 - 2.0 mmol/L   O2 Saturation 99.1 %   Patient temperature 38.1    Collection site RIGHT RADIAL    Drawn by [phone]    Sample type ARTERIAL DRAW    Allens test (pass/fail) PASS PASS  I-stat chem 8, ed  Result Value Ref Range   Sodium 145 135 - 145 mmol/L   Potassium 2.8 (L) 3.5 - 5.1 mmol/L   Chloride 112 (H) 98 - 111 mmol/L   BUN 35 (H) 6 - 20 mg/dL   Creatinine, Ser 5.785.50 (H) 0.61 - 1.24 mg/dL   Glucose, Bld 469130 (H) 70 - 99 mg/dL   Calcium, Ion 6.291.12 (L) 1.15 - 1.40 mmol/L   TCO2 11 (L) 22 - 32 mmol/L   Hemoglobin 15.0 13.0 - 17.0 g/dL   HCT 52.844.0 41.339.0 - 24.452.0 %    Dg Chest Port 1 View Result Date: 10/25/2017 CLINICAL DATA:  Fevers EXAM: PORTABLE CHEST 1 VIEW COMPARISON:  09/13/2015 FINDINGS: The heart size and mediastinal contours are within normal limits. Both lungs are clear. The visualized skeletal structures are unremarkable. IMPRESSION: No active disease. Electronically Signed   By: Alcide CleverMark  Lukens M.D.   On: 10/22/2017 09:08   Dg Chest Port 1v Same Day Result Date: 10/19/2017 CLINICAL DATA:  Intubated. EXAM: PORTABLE CHEST 1 VIEW COMPARISON:  Chest radiograph 10/17/2017 FINDINGS: Endotracheal tube in good position approximately 4 cm from carina. NG tube extends the stomach. Lungs are clear. No acute osseous abnormality. IMPRESSION: Endotracheal tube and NG tube in good position Electronically Signed   By: Genevive BiStewart  Edmunds M.D.   On: 10/13/2017 10:13    0835:  Pt moaning on arrival, eyes open. No apparent seizure activity. Febrile, tachycardic, hypotensive. Code Sepsis called.  IVF 30mg /kg, IV abx ordered after BC and UC  obtained. APAP given for fever, as well as cooling blanket placed.   0915:  Pt's mouth suctioned by RT and RN: both state copious amount of dried blood suctioned from mouth and posterior pharynx as well as foodstuffs. Pt then reportedly "looked forward with his eyes wide open, staring." No generalized tonic-clonic activity noted. Sats dropped to 70's, increased to low 90% on NRB. Pt continues to moan, eyes staring forward. Mouth suctioned again. No fresh blood or foodstuffs. Tongue appears fasciculating. Question seizure. IV versed given, IV keppra 1gm ordered. Pt intubated without incident (see separate note). OGT ordered.   1025:  T/C returned from Tele Neuro MD, case discussed, including:  HPI, pertinent PM/SHx, VS/PE, dx testing, ED course and treatment:  Agrees with ED treatment, as BP will allow: attempt versed or propofol for sedation/seizure control, pt will need admit to facility with continuous EEG monitoring.   1040:  ED RN states they were unable to place GT orally, so placed in nares. Left nares epistaxis: Topical Neosynephrine and RhinoRocket placed. No obvious epistaxis right nares, but mucus membranes dry and friable. BP dropped into 60's. Pt's mouth again began to move up/down, tongue fasciculating. IV versed ordered. Fentanyl gtt placed on hold. IVF NS 2L already infused; 2nd NS 2L infusing. Urine output minimal, appears concentrated. Temp slowly improving to 100. HR 90-100. T/C returned from [facility] PCCM Dr. Renae Fickle, case discussed, including:  HPI, pertinent PM/SHx, VS/PE, dx testing, ED course and treatment:  Agrees with ED treatment, states pt does not need CT-H while in the ED here, he can obtain when pt arrives to [facility], continue to stablize pt with continued IVF NS and start IV levophed prn (ok peripheral start per current literature review). ED RN and CT Tech made aware. Will obtain ABG.   1200:  ED RN concerned regarding blood from right  nares (around NGT) now. Right nares: mucus membranes dry/cracked and oozing, but no brisk bleeding. Left Rhinorocket balloon was partially deflated: re-inflated. Neosynephrine soaked cotton ball placed right nares around NGT. SBP 90's, HR 90's. Temp improved to 99. No clear seizure activity at this time. Lactic acid trending downward.  Urine output still scant and concentrated. IVF NS boluses continue. Awaiting transport to [facility] ICU.   1300:  Mom at bedside: states pt has hx of "seizures at night," and agrees with working DDx that pt likely had repetitive seizures overnight "because he doesn't know about them and others may not hear him when he has one." Mom also states the facility told her pt was found in bed this morning. Pt's fever has improved, as well as HR. BP continues to improve with IVF. Urine output approximately total; will continue IVF NS. CBG now low; IV D50 given with improvement. IV D5NS gtt started. IV potassium run ordered. [facility] here for transport.        Final Clinical Impressions(s) / ED Diagnoses   Final diagnoses:  None    ED Discharge Orders    None       Samuel Jester, DO 10/21/17 1610

## 2017-10-18 NOTE — Progress Notes (Signed)
Pt transported on vent to CT and returned to 4N26. Pt's vitals remained stable and no complications noted.

## 2017-10-18 NOTE — Progress Notes (Signed)
EEG complete - results pending 

## 2017-10-18 NOTE — Progress Notes (Signed)
Large amounts of blood in the oral cavity. It was noted that the patient was making a gurgling sound. Suctioned lrg thick tan/bloody secretions via NT suctioning. Some food particles were pulled out as well that looked like rice possibly. Pt tolerated well and SPO2 increased with suctioning. Gurgling sound was gone.

## 2017-10-18 NOTE — ED Notes (Signed)
This nurse tried to waste medication in Pyxis after pt was transferred. Not able to do this do to pt's name being removed. [facility]. Am wasting 150 mcg of Fentanyl with Gena Frayhris RN in the sharps

## 2017-10-18 NOTE — ED Notes (Signed)
O2 sat 77%, Dr. Clarene DukeMcmanus notified and at bedside.  REspiratory placed pt on NRB and sat 91%.

## 2017-10-18 NOTE — Consult Note (Addendum)
Neurology Consultation  Reason for Consult: Possible seizure Referring Physician: Denese KillingsAGARWALA, R    History is obtained from: Chart  HPI: Glen Johnson is a 42 y.o. male significant history including TBI back in 2012, bipolar disease, spastic left hemiparesis, schizophrenia along with seizure disorder.  Patient sees Glen Johnson as an outpatient neurologist.  Patient is on Keppra 500 mg in the morning and 1000 mg at night along with Depakote 500 mg twice daily.  On arrival to the hospital patient's valproic acid level was on the lower end of normal being 61.  Her nose who presented to the ED at [facility] with altered mental status on 10/29/2017 being found down by staff unresponsive with glycemia with a CBG of 35.  EMS was called they gave him glucagon and his glucose Dropping down to 29.  He also presented with hypotensive systolic blood pressure in the 60s and heart rate in the 140s.  Told temperature was 106.5 and he was bleeding from his oral cavity along with incontinent of stool.  She was intubated for airway protection.  There was concern for obvious seizure as patient was climbing down on tube and also was having rhythmic mouth movements.  Subsequent measure of CBG was less than 10 but rose to 110 after D50.   Patient was loaded with Keppra 1000 mg at 0 949.  And is scheduled for 500 mg twice daily along with Depakote 500 mg twice daily.  Currently patient is intubated, breathing over the vent with blood pressure of 79/58 but on pressors.  Again he is currently having mouth chewing movements.--EEG is currently being attached to patient's head     Unable to obtain due to altered mental status.   Past Medical History:  Diagnosis Date  . Bipolar disorder (HCC)   . Depression   . Hypertension   . Left spastic hemiparesis (HCC)   . Lives in group home   . Outbursts of anger   . Renal disorder    chronic kidney disease, stage II  . Schizophrenia (HCC)   . Seizures (HCC)   . TBI (traumatic  brain injury) (HCC) 2012   unsteady gait, speech difficulty    Family History  Problem Relation Age of Onset  . Seizures Mother   . Migraines Mother   . Arthritis Mother     Social History:   reports that he has quit smoking. He has never used smokeless tobacco. He reports that he does not drink alcohol or use drugs.  Medications  Current Facility-Administered Medications:  .  0.9 %  sodium chloride infusion, 250 mL, Intravenous, PRN, Glen ResidesBabcock, [name], NP .  0.9 %  sodium chloride infusion, 500 mL, Intravenous, Q30 min PRN, Glen MartinetBabcock, [name], NP .  cefTRIAXone (ROCEPHIN) 2 Johnson in sodium chloride 0.9 % 100 mL IVPB, 2 Johnson, Intravenous, Q12H, Mancheril, Glen Johnson, RPH .  clonazePAM (KLONOPIN) tablet 0.5 mg, 0.5 mg, Per Tube, BID PRN, Glen MartinetBabcock, [name], NP .  dextrose 50 % solution, , , ,  .  famotidine (PEPCID) IVPB 20 mg premix, 20 mg, Intravenous, Q24H, Babcock, [name], NP .  fentaNYL (SUBLIMAZE) 100 MCG/2ML injection, , , ,  .  fentaNYL (SUBLIMAZE) injection 100 mcg, 100 mcg, Intravenous, Q15 min PRN, Glen MartinetBabcock, [name], NP .  fentaNYL (SUBLIMAZE) injection 100 mcg, 100 mcg, Intravenous, Q2H PRN, Glen MartinetBabcock, [name], NP .  levETIRAcetam (KEPPRA) IVPB 500 mg/100 mL premix, 500 mg, Intravenous, Q12H, Glen MartinetBabcock, [name], NP .  midazolam (VERSED) 5  MG/5ML injection, , , ,  .  norepinephrine (LEVOPHED) 4-5 MG/250ML-% infusion SOLN, , , ,  .  norepinephrine (LEVOPHED) 4mg  in D5W premix infusion, 5-50 mcg/min, Intravenous, Titrated, Babcock, [name], NP .  phenylephrine (NEO-SYNEPHRINE) 1 % nasal drops, , , ,  .  potassium chloride 10 mEq in 50 mL *CENTRAL LINE* IVPB, 10 mEq, Intravenous, Q1 Hr x 4, Babcock, [name], NP .  potassium chloride 10 MEQ/100ML IVPB, , , ,  .  propofol (DIPRIVAN) 1000 MG/100ML infusion, 0-50 mcg/kg/min, Intravenous, Continuous, Babcock, [name], NP .  sodium bicarbonate 150 mEq in dextrose 5 % 1,000 mL infusion, , Intravenous, Continuous, Glen Johnson, [name], NP .   valproate (DEPACON) 500 mg in dextrose 5 % 50 mL IVPB, 500 mg, Intravenous, Q12H, Mancheril, Glen Schatz, RPH .  vancomycin variable dose per unstable renal function (pharmacist dosing), , Does not apply, See admin instructions, Mancheril, Glen Schatz, RPH .  vasopressin (PITRESSIN) 40 Units in sodium chloride 0.9 % 250 mL (0.16 Units/mL) infusion, 0.03 Units/min, Intravenous, Continuous, Glen Martinet, NP   Exam: Current vital signs: BP 90/61   Pulse 99   Temp (!) 96.6 F (35.9 C)   Resp (!) 29   Ht 5\' 11"  (1.803 m)   Wt 89.5 kg (197 lb 5 oz)   SpO2 100%   BMI 27.52 kg/m  Vital signs in last 24 hours: Temp:  [96.6 F (35.9 C)-106.5 F (41.4 C)] 96.6 F (35.9 C) (08/06 1515) Pulse Rate:  [96-128] 99 (08/06 1300) Resp:  [17-53] 29 (08/06 1515) BP: (67-150)/(47-86) 90/61 (08/06 1515) SpO2:  [76 %-100 %] 100 % (08/06 1420) FiO2 (%):  [40 %-100 %] 40 % (08/06 1420) Weight:  [86.2 kg (190 lb)-89.5 kg (197 lb 5 oz)] 89.5 kg (197 lb 5 oz) (08/06 1420)  GENERAL: Intubated and sedated HEENT: -Blood noted around intubation tube Ext: warm, well perfused, intact peripheral pulses,  Mental Status: Patient does not respond to verbal stimuli or sternal rub.  He is also intubated and sedated.  He is breathing over the vent but does not follow any verbal commands.  Current blood pressure is low in the 70s systolically Cranial Nerves: II: patient does not respond confrontation bilaterally,  III,IV,VI: doll's response absent bilaterally. pupils right 2 mm, left 2 mm,and reactive bilaterally V,VII: corneal reflex present bilaterally  VIII: patient does not respond to verbal stimuli IX,X: gag reflex present, XI: trapezius strength unable to test bilaterally XII: tongue strength unable to test Motor: Mild increased tone throughout but no purposeful movement or movement to noxious stimuli Sensory: Does not respond to noxious stimuli in any extremity. Deep Tendon Reflexes:  2+ throughout at  the knee jerks in upper extremities. Plantars: I did not elicit any plantar reflexes Cerebellar: Unable to perform   Labs I have reviewed labs in epic and the results pertinent to this consultation are:   CBC    Component Value Date/Time   WBC 22.4 (H) 10/26/2017 0847   RBC 4.49 10/16/2017 0847   HGB 14.3 10/25/2017 0847   HCT 42.0 10/26/2017 0847   PLT 119 (L) 10/22/2017 0847   MCV 93.5 10/20/2017 0847   MCH 31.8 11/02/2017 0847   MCHC 34.0 11/10/2017 0847   RDW 13.9 11/04/2017 0847   LYMPHSABS 4.3 (H) 10/22/2017 0847   MONOABS 1.8 (H) 10/22/2017 0847   EOSABS 0.0 10/29/2017 0847   BASOSABS 0.0 10/29/2017 0847    CMP     Component Value Date/Time  NA 144 11/13/2017 0847   K 2.8 (L) 11-13-17 0847   CL 110 13-Nov-2017 0847   CO2 9 (L) Nov 13, 2017 0847   GLUCOSE 137 (H) Nov 13, 2017 0847   BUN 38 (H) 11/13/17 0847   CREATININE 5.80 (H) 2017-11-13 0847   CALCIUM 9.2 11-13-17 0847   PROT 7.3 11/13/17 0847   ALBUMIN 3.5 11-13-2017 0847   AST 360 (H) 11-13-17 0847   ALT 188 (H) 11-13-2017 0847   ALKPHOS 62 Nov 13, 2017 0847   BILITOT 0.8 11-13-2017 0847   GFRNONAA 11 (L) 13-Nov-2017 0847   GFRAA 13 (L) 2017/11/13 0847    Lipid Panel  No results found for: CHOL, TRIG, HDL, CHOLHDL, VLDL, LDLCALC, LDLDIRECT   Imaging I have reviewed the images obtained:  CT-scan of the brain--plan to obtain after EEG has been placed to evaluate for seizure.  MRI examination of the brain--will obtain later when stable   I have seen and evaluated the patient. I have reviewed the above note.    Assessment: 42 year old male with history of TBI and seizures who was found down and unresponsive.  Possibilities include hypoglycemic injury, prolonged status epilepticus, septic encephalopathy, meningitis.  I am concerned with some intermittent facial movements that he is having that he could be having continued intermittent seizures, but no clear evidence of ongoing continuous  status epilepticus on his EEG.  Recommendations: -Overnight EEG - LP -Agree with broad-spectrum antibiotics for possible meningitis and/or sepsis -Depakote 500 3 times daily, additional 500mg  now -keppra 500mg  BID - add vimpat 150 mg BID -Daily VPA levels  This patient is critically ill and at significant risk of neurological worsening, death and care requires constant monitoring of vital signs, hemodynamics,respiratory and cardiac monitoring, neurological assessment, discussion with family, other specialists and medical decision making of high complexity. I spent 55 minutes of neurocritical care time  in the care of  this patient.  Ritta Slot, MD [facility] [phone]  If 7pm- 7am, please page neurology on call as listed in AMION. November 13, 2017  6:28 PM

## 2017-10-18 NOTE — ED Notes (Signed)
CRITICAL VALUE ALERT  Critical Value:  Lactic 7.6  Date & Time Notied:  11/05/2017 1122  Provider Notified: Dr Clarene DukeMcManus  Orders Received/Actions taken:

## 2017-10-18 NOTE — ED Notes (Addendum)
Per MD, hold Fentanyl drip. Pt also has a nose bleed. MD McManus packed nose bleed.

## 2017-10-18 NOTE — H&P (Addendum)
PULMONARY / CRITICAL CARE MEDICINE   Name: Glen Johnson MRN: [id number] DOB: Aug 16, 1975    ADMISSION DATE:  11/08/2017 CONSULTATION DATE:  8/6  REFERRING MD:  McMannus   CHIEF COMPLAINT:  Acute encephalopathy r/o status epilepticus and acute hypoxic respiratory failure   HISTORY OF PRESENT ILLNESS:    This is a 42 year old male w/ multiple medical problems including: prior TBI (2012),  bipolar disease, spastic left hemiparesis, schizophrenia, and seizure disorder. He resides in a group home. Has had gait and speech difficulty since his TBI in 2012. He is on multiple medications at baseline for his behavior and seizure disorders including: Latuda, seroquel, Remeron, clonazepam, depakote, keppra and buspar. Most recently seen by neurology (Dr [name]) may 2019. During this time exam notable for him being conversant w/ slurred speech, cooperative, he had mild lower extremity rigidity and spastic left upper extremity paralysis.   He presented to the ED at [facility] the am of 8/6 after being found by staff unresponsive and hypoglycemic (CBG 35). EMS was called. They tried glucagon kept dropping down to 29. On arrival he was moaning but non-verbal. He was also hypotensive w/ SBP in 60s and HR 140s. On arrival to ER Rectal temp was 106.5, he was bleeding from oral cavity, and incontinent of stool/diarrhea. He had "gurgling respirations" and had large amounts of thick tan/bloody secretions as well as food particles suctioned from his oral cavity. Oxygen sats on arrival were 77% on room air.   He was pre-oxygenated and subsequently intubated for respiratory failure and ineffective airway protection. There was concern about seizing as the pt's mouth was initially "clamped shut". In ER dx eval notable for: initial CXR was clear w/out infiltrate, was low volume film however, scr 5.80 (up from 2.23 4 months prior), lactic acid of 9.3, total CKs of 3642. Subsequent CBG <10 which again rose to 110 after D50. IV fluids  were continued, he was started on norepinephrine gtt and transferred to [facility] for further diagnostic and supportive care.     PAST MEDICAL HISTORY :  He  has a past medical history of Bipolar disorder (HCC), Depression, Hypertension, Left spastic hemiparesis (HCC), Lives in group home, Outbursts of anger, Renal disorder, Schizophrenia (HCC), Seizures (HCC), and TBI (traumatic brain injury) (HCC) (2012).  PAST SURGICAL HISTORY: He  has a past surgical history that includes Tracheostomy and Tracheostomy closure.  No Known Allergies  No current facility-administered medications on file prior to encounter.    Current Outpatient Medications on File Prior to Encounter  Medication Sig  . amLODipine (NORVASC) 10 MG tablet Take 1 tablet (10 mg total) by mouth daily.  . busPIRone (BUSPAR) 5 MG tablet Take 1 tablet (5 mg total) by mouth 3 (three) times daily. (Patient taking differently: Take 10 mg by mouth 3 (three) times daily. )  . clonazePAM (KLONOPIN) 0.5 MG tablet Take 1 tablet (0.5 mg total) by mouth 2 (two) times daily as needed (anxiety).  Marland Kitchen divalproex (DEPAKOTE ER) 500 MG 24 hr tablet One in the morning, 2 tabs at night  . glycopyrrolate (ROBINUL) 2 MG tablet Take 1 tablet (2 mg total) by mouth 3 (three) times daily.  Marland Kitchen levETIRAcetam (KEPPRA) 500 MG tablet Take 1 tablet (500 mg total) by mouth 2 (two) times daily.  . Lurasidone HCl 60 MG TABS Take 60 mg by mouth at bedtime.  . metoprolol tartrate (LOPRESSOR) 25 MG tablet Take 1 tablet (25 mg total) by mouth 2 (two) times daily.  . mirtazapine (REMERON)  15 MG tablet Take 1 tablet (15 mg total) by mouth at bedtime.  . ondansetron (ZOFRAN ODT) 4 MG disintegrating tablet Take 1 tablet (4 mg total) by mouth every 8 (eight) hours as needed for nausea.    FAMILY HISTORY:  His family history includes Arthritis in his mother; Migraines in his mother; Seizures in his mother.  SOCIAL HISTORY: He  reports that he has quit smoking. He has never used  smokeless tobacco. He reports that he does not drink alcohol or use drugs.  REVIEW OF SYSTEMS:   Not able   SUBJECTIVE:  Arrived to ICU intubated, on norepinephrine gtt. Unresponsive.   VITAL SIGNS: Blood Pressure (Abnormal) 92/59   Pulse 99   Temperature 97.9 F (36.6 C)   Respiration 17   Height 5\' 11"  (1.803 m)   Weight 190 lb (86.2 kg) Comment: pulled from previoius visit  Oxygen Saturation 100%   Body Mass Index 26.50 kg/m   HEMODYNAMICS:    VENTILATOR SETTINGS: Vent Mode: PRVC FiO2 (%):  [40 %-100 %] 40 % Set Rate:  [14 bmp-18 bmp] 14 bmp Vt Set:  [550 mL-600 mL] 550 mL PEEP:  [5 cmH20] 5 cmH20 Plateau Pressure:  [22 cmH20] 22 cmH20  INTAKE / OUTPUT: No intake/output data recorded.  PHYSICAL EXAMINATION: General: 42 year old male patient currently unresponsive on the ventilator Neuro: Grimaces to noxious stimulus only.  Rightward gaze, no localization, some grinding of teeth. HEENT: Poor dentition.  Both ears are packed.  Orally intubated.  Bloody drainage from nose and mouth Cardiovascular: The rate and rhythm Lungs: Clear diminished bases Abdomen: Soft nontender Musculoskeletal: Equal strength and bulk Skin: Arm and dry  LABS:  BMET Recent Labs  Lab 03-26-2017 0840 03-26-2017 0847  NA 145 144  K 2.8* 2.8*  CL 112* 110  CO2  --  9*  BUN 35* 38*  CREATININE 5.50* 5.80*  GLUCOSE 130* 137*    Electrolytes Recent Labs  Lab 03-26-2017 0847 03-26-2017 0855  CALCIUM 9.2  --   MG  --  1.8    CBC Recent Labs  Lab 03-26-2017 0840 03-26-2017 0847  WBC  --  22.4*  HGB 15.0 14.3  HCT 44.0 42.0  PLT  --  119*    Coag's No results for input(s): APTT, INR in the last 168 hours.  Sepsis Markers Recent Labs  Lab 03-26-2017 0847 03-26-2017 1049  LATICACIDVEN 9.3* 7.6*    ABG Recent Labs  Lab 03-26-2017 1125  PHART 7.236*  PCO2ART 26.0*  PO2ART 464*    Liver Enzymes Recent Labs  Lab 03-26-2017 0847  AST 360*  ALT 188*  ALKPHOS 62  BILITOT 0.8   ALBUMIN 3.5    Cardiac Enzymes Recent Labs  Lab 03-26-2017 0847  TROPONINI 2.67*    Glucose Recent Labs  Lab 03-26-2017 1253 03-26-2017 1302  GLUCAP <10* 110*    Imaging Dg Chest Port 1 View  Result Date: 11/10/2017 CLINICAL DATA:  Fevers EXAM: PORTABLE CHEST 1 VIEW COMPARISON:  09/13/2015 FINDINGS: The heart size and mediastinal contours are within normal limits. Both lungs are clear. The visualized skeletal structures are unremarkable. IMPRESSION: No active disease. Electronically Signed   By: Alcide CleverMark  Lukens M.D.   On: 2017-12-05 09:08   Dg Chest Port 1v Same Day  Result Date: 10/22/2017 CLINICAL DATA:  Intubated. EXAM: PORTABLE CHEST 1 VIEW COMPARISON:  Chest radiograph 2017-12-05 FINDINGS: Endotracheal tube in good position approximately 4 cm from carina. NG tube extends the stomach. Lungs are clear. No acute osseous  abnormality. IMPRESSION: Endotracheal tube and NG tube in good position Electronically Signed   By: Genevive Bi M.D.   On: 10/21/2017 10:13     STUDIES:  CT head 8/6>>>  CULTURES: Blood culture 8/6>>> Sputum culture 8/6>>> Urine culture 8/6>>> CNS 8/6>>>  ANTIBIOTICS: vanc 8/6>>> Rocephin 8/6>>>  SIGNIFICANT EVENTS:   LINES/TUBES: oett 8/5>>>  DISCUSSION: 42 year old male patient with history of schizophrenia, bipolar disease, prior TBI in 2012 leaving him with seizure disorder, as well as left upper extremity spastic paresis.  At baseline he is ambulatory with a cane/walker and has good speech.  He presents comatose, febrile as high as 106, possibly seizing and in shock.  We will admit him with working diagnosis of sepsis, with concern for CNS infection.  Unclear if he is actively seizing at this point.  He needs stat CT head, aggressive volume resuscitation, correction of acidosis, broad-spectrum antibiotics, and lumbar puncture.  We will also ask neurology to assist with his care.  ASSESSMENT / PLAN:  Acute encephalopathy/COMA. ? Status  epilepticus ? Vs post-ictal; ? Meningitis or CNS infection. likely complicated by metabolic derangements: severe hypoglycemia, likely acidosis and sepsis also contributing.  h/o TBI, seizure d/o (worse since TBI in 2012), schizophrenia and bipolar disease.  Plan STAT CT head EEG Neuro-eval  Will likely need LP Cont keppra and depakote Also add benzos given on clonazepam.  Can hold seroquel, Remeron, buspar and latuda for now   High fever. Severe sepsis/septic shock. Source not clear. May have aspirated but would be a complicating infection, doubt cause. ? CNS vs some other systemic infection.  NMS doubtful  Plan Repeat temp Central access Ck CVP: goal 8-12; cont IVFs MAP goal > 65, titrate nor-epinephrine  Pan culture w/ including LP after CT head Will place on broad spec abx to include CNS coverage.  vanc and rocephin day 1  Mild troponin elevation-->suspect demand related. Difficult to interpret  Plan Trend  Consider echo  Acute Hypoxic respiratory failure in setting of ineffective airway clearance. Likely complicated by aspiration event.  -Pcxr personally reviewed: ett good position. No clear infiltrate as of yet.  -post intubation abg reviewed.  Plan Full vent support  Sputum culture  PAD protocol (initially RASS goal -2, but may need to re-evaluate this if actively seizing) VAP interventions F/u abg and cxr now and in am   Severe metabolic acidosis + AG, w/ marked lactic acidosis.  -LA cleared from 9.3 to 7.6. Suspect that this is a mix of sepsis/hypoperfusion as well as possible post-seizure related. Plan Cont volume resuscitation Repeat LA, ABG and keep MAP > 65 Serial chemistries   Epistaxis->packed nasally  Plan Cont packing will re-assess overnight   Profound hypoglycemia.  Has been as low as 29. He is not on any hypoglycemics or insulins. ? Etiology. Certainly could have exacerbated seizures.  Plan cbg every 1 hr Cont d5 w/ bicarb but may need D10 Ck  cortisol Sepsis workup as above.    Acute on chronic renal failure (baseline CKD stage III). Suspect AKI exacerbated by Rhabdo.  Nml creatinine 2.23 at baseline. Now 5.8. Total CKs > 3000 Plan IVFs w/ bicarb gtt Serial chemistries, CKs Serial UAs A line for freq lab draws Strict I&O Renal dose meds F/u UA Consider renal US   Fluid and electrolyte imbalance: hypokalemia  Plan Replace and recheck  Elevated LFTs. Likely shock liver Plan Trend   Mild thrombocytopenia ->likely sepsis  Plan Trend cbc   DVT prophylaxis: SCD SUP: H2B  Diet: NPO Activity: BR Disposition : ICU   Simonne Martinet ACNP-BC [facility] Pulmonary/Critical Care Pager # [phone] OR # [phone] if no answer  10/15/2017, 1:36 PM

## 2017-10-18 NOTE — ED Notes (Addendum)
Pt started on Levophed drip by [facility]. OTF.

## 2017-10-18 NOTE — Progress Notes (Addendum)
Pharmacy Antibiotic & AED Note  Elam Dutchravis Witczak is a 42 y.o. male admitted on 11/05/2017 with sepsis. Patient transferred from OSH with AMS and seizures with concern for meningitis. Pharmacy consulted to dose vancomycin and ceftriaxone. Patient has already received a dose of Zosyn and vancomycin at OSH.   WBC 22.4. SCr 5.8 (BL ~ 1.7). LA 7.6. Hypothermic  Patient is also on valproic acid 500 mg ER twice daily at home. Valproic acid level today is therapeutic at 61. Discussed case with critical care team.   Plan: Given AKI, will dose vancomycin per levels Ceftriaxone 2 gm IV Q 12 hours Valproic acid 500 mg IV q 12 hours Monitor labs, c/s, and vanco trough as indicated   Height: 5\' 11"  (180.3 cm) Weight: 197 lb 5 oz (89.5 kg) IBW/kg (Calculated) : 75.3  Temp (24hrs), Avg:101.8 F (38.8 C), Min:96.6 F (35.9 C), Max:106.5 F (41.4 C)  Recent Labs  Lab 10/22/2017 0840 11/06/2017 0847 11/11/2017 1049  WBC  --  22.4*  --   CREATININE 5.50* 5.80*  --   LATICACIDVEN  --  9.3* 7.6*    Estimated Creatinine Clearance: 17.7 mL/min (A) (by C-G formula based on SCr of 5.8 mg/dL (H)).    No Known Allergies  Antimicrobials this admission: Vanco 8/6 >>  Zosyn 8/6 >>   Dose adjustments this admission: Zosyn/Vanco  Microbiology results: 8/6 BCx: pending 8/6 UCx: pending    Thank you for allowing pharmacy to be a part of this patient's care.  Vinnie LevelBenjamin Evelin Cake, PharmD., BCPS Clinical Pharmacist Clinical phone for 11/12/2017 until 3:30pm: [phone] If after 3:30pm, please refer to [facility] for unit-specific pharmacist

## 2017-10-18 NOTE — Procedures (Signed)
Central Venous Catheter Insertion Procedure Note Elam Dutchravis Creighton [id number] [birth year]  Procedure: Insertion of Central Venous Catheter Indications: Assessment of intravascular volume, Drug and/or fluid administration and Frequent blood sampling  Procedure Details Consent: Risks of procedure as well as the alternatives and risks of each were explained to the (patient/caregiver).  Consent for procedure obtained. Time Out: Verified patient identification, verified procedure, site/side was marked, verified correct patient position, special equipment/implants available, medications/allergies/relevent history reviewed, required imaging and test results available.  Performed  Maximum sterile technique was used including antiseptics, cap, gloves, gown, hand hygiene, mask and sheet. Skin prep: Chlorhexidine; local anesthetic administered A antimicrobial bonded/coated triple lumen catheter was placed in the left subclavian vein using the Seldinger technique.  Evaluation Blood flow good Complications: No apparent complications Patient did tolerate procedure well. Chest X-ray ordered to verify placement.  CXR: pending.  Shelby Mattocksete E Eyva Califano 10/27/2017, 3:38 PM  Simonne MartinetPeter E Danyel Tobey ACNP-BC [facility] Pulmonary/Critical Care Pager # [phone] OR # [phone] if no answer

## 2017-10-18 NOTE — Progress Notes (Signed)
Pharmacy Antibiotic Note  Glen Johnson is a 42 y.o. male admitted on 10/26/2017 with sepsis.  Pharmacy has been consulted for Vancomycin and Zosyn dosing.  Plan: Vancomycin 1500 mg IV x 1 dose Awaiting admission for further dosing. Zosyn 3.375g IV every 12 hours. Monitor labs, c/s, and vanco trough as indicated   Height: 5\' 11"  (180.3 cm) Weight: 190 lb (86.2 kg)(pulled from previoius visit) IBW/kg (Calculated) : 75.3  Temp (24hrs), Avg:102.4 F (39.1 C), Min:97.9 F (36.6 C), Max:106.5 F (41.4 C)  Recent Labs  Lab 11/01/2017 0840 10/17/2017 0847 11/11/2017 1049  WBC  --  22.4*  --   CREATININE 5.50* 5.80*  --   LATICACIDVEN  --  9.3* 7.6*    Estimated Creatinine Clearance: 17.7 mL/min (A) (by C-G formula based on SCr of 5.8 mg/dL (H)).    No Known Allergies  Antimicrobials this admission: Vanco 8/6 >>  Zosyn 8/6 >>   Dose adjustments this admission: Zosyn/Vanco  Microbiology results: 8/6 BCx: pending 8/6 UCx: pending    Thank you for allowing pharmacy to be a part of this patient's care.  Judeth CornfieldSteven Justin Buechner, PharmD Clinical Pharmacist 11/08/2017 1:49 PM

## 2017-10-18 NOTE — Progress Notes (Signed)
Pharmacy Consult note:   Pharmacy Consult for: Acyclovir Indication:  CNS viral coverage  No Known Allergies  Assessment:  Vancomycin and Zoysn x 1> changed to Ceftriaxone begun today.  Adding Acyclovir tonight for viral coverage.  Goal of Therapy:   appropriate Acyclovir dose for indication and renal function  Plan:    Acyclovir 750 mg IV q24hrs (~10 mg/kg IBW)   Follow renal function for any need to adjust.  Patient Measurements: Height: 5\' 11"  (180.3 cm) Weight: 197 lb 5 oz (89.5 kg) IBW/kg (Calculated) : 75.3 Acyclovir Dosing Weight: 75 kg  Vital Signs: Temp: 95.9 F (35.5 C) (08/06 1800) Temp Source: Bladder (08/06 0936) BP: 91/67 (08/06 1800) Pulse Rate: 99 (08/06 1742)   Intake/Output from this shift: Total I/O In: 1486.4 [I.V.:1318; IV Piggyback:168.4] Out: 150 [Urine:150]  Labs: Recent Labs    2017-12-17 0840 2017-12-17 0847 2017-12-17 0855  WBC  --  22.4*  --   HGB 15.0 14.3  --   HCT 44.0 42.0  --   PLT  --  119*  --   CREATININE 5.50* 5.80*  --   MG  --   --  1.8  ALBUMIN  --  3.5  --   PROT  --  7.3  --   AST  --  360*  --   ALT  --  188*  --   ALKPHOS  --  62  --   BILITOT  --  0.8  --    Estimated Creatinine Clearance: 17.7 mL/min (A) (by C-G formula based on SCr of 5.8 mg/dL (H)).  Glen Johnson, Glen Johnson, RPh Pager: [phone] 11/12/2017,6:40 PM

## 2017-10-18 NOTE — Procedures (Signed)
Arterial Catheter Insertion Procedure Note Glen Johnson [id number] 1975/11/22  Procedure: Insertion of Arterial Catheter  Indications: Blood pressure monitoring  Procedure Details Consent: Risks of procedure as well as the alternatives and risks of each were explained to the (patient/caregiver).  Consent for procedure obtained. Time Out: Verified patient identification, verified procedure, site/side was marked, verified correct patient position, special equipment/implants available, medications/allergies/relevent history reviewed, required imaging and test results available.  Performed  Maximum sterile technique was used including antiseptics, cap, gloves, gown, hand hygiene, mask and sheet. Skin prep: Chlorhexidine; local anesthetic administered 20 gauge catheter was inserted into right femoral artery using the Seldinger technique. ULTRASOUND GUIDANCE USED: YES Evaluation Blood flow good; BP tracing good. Complications: No apparent complications.   Glen Johnson 10/28/2017

## 2017-10-18 NOTE — Sedation Documentation (Signed)
Dr. Clarene DukeMcManus intubated with size 7 ett tube, secured at 25cm at lip.  Positive color change.  Called radiology for tube placement confirmation.

## 2017-10-19 ENCOUNTER — Inpatient Hospital Stay (HOSPITAL_COMMUNITY): Payer: Medicaid Other

## 2017-10-19 LAB — CBC
HEMATOCRIT: 30.1 % — AB (ref 39.0–52.0)
HEMOGLOBIN: 9.6 g/dL — AB (ref 13.0–17.0)
MCH: 31.4 pg (ref 26.0–34.0)
MCHC: 31.9 g/dL (ref 30.0–36.0)
MCV: 98.4 fL (ref 78.0–100.0)
Platelets: 25 10*3/uL — CL (ref 150–400)
RBC: 3.06 MIL/uL — ABNORMAL LOW (ref 4.22–5.81)
RDW: 14.9 % (ref 11.5–15.5)
WBC: 10.5 10*3/uL (ref 4.0–10.5)

## 2017-10-19 LAB — GLUCOSE, CAPILLARY
GLUCOSE-CAPILLARY: 45 mg/dL — AB (ref 70–99)
GLUCOSE-CAPILLARY: 84 mg/dL (ref 70–99)
GLUCOSE-CAPILLARY: 98 mg/dL (ref 70–99)
Glucose-Capillary: 173 mg/dL — ABNORMAL HIGH (ref 70–99)
Glucose-Capillary: 80 mg/dL (ref 70–99)
Glucose-Capillary: 83 mg/dL (ref 70–99)
Glucose-Capillary: 91 mg/dL (ref 70–99)

## 2017-10-19 LAB — RAPID URINE DRUG SCREEN, HOSP PERFORMED
Amphetamines: NOT DETECTED
BARBITURATES: NOT DETECTED
BENZODIAZEPINES: POSITIVE — AB
COCAINE: NOT DETECTED
Opiates: NOT DETECTED
Tetrahydrocannabinol: NOT DETECTED

## 2017-10-19 LAB — BASIC METABOLIC PANEL
ANION GAP: 19 — AB (ref 5–15)
ANION GAP: 20 — AB (ref 5–15)
Anion gap: 17 — ABNORMAL HIGH (ref 5–15)
Anion gap: 18 — ABNORMAL HIGH (ref 5–15)
BUN: 34 mg/dL — ABNORMAL HIGH (ref 6–20)
BUN: 35 mg/dL — ABNORMAL HIGH (ref 6–20)
BUN: 36 mg/dL — ABNORMAL HIGH (ref 6–20)
BUN: 38 mg/dL — ABNORMAL HIGH (ref 6–20)
CHLORIDE: 110 mmol/L (ref 98–111)
CO2: 12 mmol/L — ABNORMAL LOW (ref 22–32)
CO2: 12 mmol/L — ABNORMAL LOW (ref 22–32)
CO2: 14 mmol/L — AB (ref 22–32)
CO2: 13 mmol/L — ABNORMAL LOW (ref 22–32)
Calcium: 5.9 mg/dL — CL (ref 8.9–10.3)
Calcium: 5.9 mg/dL — CL (ref 8.9–10.3)
Calcium: 5.7 mg/dL — CL (ref 8.9–10.3)
Calcium: 5.4 mg/dL — CL (ref 8.9–10.3)
Chloride: 114 mmol/L — ABNORMAL HIGH (ref 98–111)
Chloride: 113 mmol/L — ABNORMAL HIGH (ref 98–111)
Chloride: 108 mmol/L (ref 98–111)
Creatinine, Ser: 4.91 mg/dL — ABNORMAL HIGH (ref 0.61–1.24)
Creatinine, Ser: 4.77 mg/dL — ABNORMAL HIGH (ref 0.61–1.24)
Creatinine, Ser: 4.83 mg/dL — ABNORMAL HIGH (ref 0.61–1.24)
Creatinine, Ser: 4.96 mg/dL — ABNORMAL HIGH (ref 0.61–1.24)
GFR calc Af Amer: 15 mL/min — ABNORMAL LOW (ref >60)
GFR calc Af Amer: 16 mL/min — ABNORMAL LOW (ref >60)
GFR calc Af Amer: 15 mL/min — ABNORMAL LOW (ref >60)
GFR calc non Af Amer: 14 mL/min — ABNORMAL LOW (ref >60)
GFR calc non Af Amer: 14 mL/min — ABNORMAL LOW (ref >60)
GFR calc non Af Amer: 13 mL/min — ABNORMAL LOW (ref >60)
GFR, EST AFRICAN AMERICAN: 16 mL/min — AB (ref >60)
GFR, EST NON AFRICAN AMERICAN: 13 mL/min — AB (ref >60)
GLUCOSE: 191 mg/dL — AB (ref 70–99)
GLUCOSE: 114 mg/dL — AB (ref 70–99)
Glucose, Bld: 149 mg/dL — ABNORMAL HIGH (ref 70–99)
Glucose, Bld: 117 mg/dL — ABNORMAL HIGH (ref 70–99)
POTASSIUM: 3.7 mmol/L (ref 3.5–5.1)
POTASSIUM: 3.6 mmol/L (ref 3.5–5.1)
Potassium: 3.8 mmol/L (ref 3.5–5.1)
Potassium: 3.7 mmol/L (ref 3.5–5.1)
SODIUM: 143 mmol/L (ref 135–145)
SODIUM: 143 mmol/L (ref 135–145)
SODIUM: 141 mmol/L (ref 135–145)
Sodium: 143 mmol/L (ref 135–145)

## 2017-10-19 LAB — HEPATIC FUNCTION PANEL
ALT: 1591 U/L — AB (ref 0–44)
ALT: 2551 U/L — ABNORMAL HIGH (ref 0–44)
AST: 2177 U/L — ABNORMAL HIGH (ref 15–41)
AST: 3066 U/L — AB (ref 15–41)
Albumin: 1.4 g/dL — ABNORMAL LOW (ref 3.5–5.0)
Albumin: 1.6 g/dL — ABNORMAL LOW (ref 3.5–5.0)
Alkaline Phosphatase: 41 U/L (ref 38–126)
Alkaline Phosphatase: 49 U/L (ref 38–126)
BILIRUBIN DIRECT: 0.4 mg/dL — AB (ref 0.0–0.2)
BILIRUBIN DIRECT: 0.7 mg/dL — AB (ref 0.0–0.2)
BILIRUBIN INDIRECT: 0.8 mg/dL (ref 0.3–0.9)
Indirect Bilirubin: 0.6 mg/dL (ref 0.3–0.9)
TOTAL PROTEIN: 3.6 g/dL — AB (ref 6.5–8.1)
Total Bilirubin: 1.2 mg/dL (ref 0.3–1.2)
Total Bilirubin: 1.3 mg/dL — ABNORMAL HIGH (ref 0.3–1.2)
Total Protein: 3.3 g/dL — ABNORMAL LOW (ref 6.5–8.1)

## 2017-10-19 LAB — POCT I-STAT 3, ART BLOOD GAS (G3+)
ACID-BASE DEFICIT: 15 mmol/L — AB (ref 0.0–2.0)
ACID-BASE DEFICIT: 13 mmol/L — AB (ref 0.0–2.0)
Bicarbonate: 11.6 mmol/L — ABNORMAL LOW (ref 20.0–28.0)
Bicarbonate: 12.3 mmol/L — ABNORMAL LOW (ref 20.0–28.0)
O2 SAT: 99 %
O2 SAT: 99 %
PH ART: 7.188 — AB (ref 7.350–7.450)
TCO2: 13 mmol/L — ABNORMAL LOW (ref 22–32)
TCO2: 13 mmol/L — AB (ref 22–32)
pCO2 arterial: 30.6 mmHg — ABNORMAL LOW (ref 32.0–48.0)
pCO2 arterial: 26.2 mmHg — ABNORMAL LOW (ref 32.0–48.0)
pH, Arterial: 7.28 — ABNORMAL LOW (ref 7.350–7.450)
pO2, Arterial: 190 mmHg — ABNORMAL HIGH (ref 83.0–108.0)
pO2, Arterial: 133 mmHg — ABNORMAL HIGH (ref 83.0–108.0)

## 2017-10-19 LAB — APTT: APTT: 132 s — AB (ref 24–36)

## 2017-10-19 LAB — GASTROINTESTINAL PANEL BY PCR, STOOL (REPLACES STOOL CULTURE)

## 2017-10-19 LAB — LACTOFERRIN, FECAL, QUALITATIVE: LACTOFERRIN, FECAL, QUAL: POSITIVE — AB

## 2017-10-19 LAB — CBC WITH DIFFERENTIAL/PLATELET
BASOS ABS: 0 10*3/uL (ref 0.0–0.1)
Basophils Relative: 0 %
EOS ABS: 0 10*3/uL (ref 0.0–0.7)
EOS PCT: 0 %
HEMATOCRIT: 32.2 % — AB (ref 39.0–52.0)
Hemoglobin: 10.5 g/dL — ABNORMAL LOW (ref 13.0–17.0)
LYMPHS ABS: 1.5 10*3/uL (ref 0.7–4.0)
Lymphocytes Relative: 8 %
MCH: 31.2 pg (ref 26.0–34.0)
MCHC: 32.6 g/dL (ref 30.0–36.0)
MCV: 95.5 fL (ref 78.0–100.0)
Monocytes Absolute: 0.5 10*3/uL (ref 0.1–1.0)
Monocytes Relative: 3 %
NEUTROS PCT: 89 %
Neutro Abs: 16.3 10*3/uL — ABNORMAL HIGH (ref 1.7–7.7)
PLATELETS: 21 10*3/uL — AB (ref 150–400)
RBC: 3.37 MIL/uL — ABNORMAL LOW (ref 4.22–5.81)
RDW: 14.9 % (ref 11.5–15.5)
WBC Morphology: INCREASED
WBC: 18.3 10*3/uL — ABNORMAL HIGH (ref 4.0–10.5)

## 2017-10-19 LAB — TROPONIN I
Troponin I: 2.14 ng/mL (ref <0.03)
Troponin I: 2.65 ng/mL (ref <0.03)

## 2017-10-19 LAB — PHOSPHORUS: PHOSPHORUS: 3 mg/dL (ref 2.5–4.6)

## 2017-10-19 LAB — URINALYSIS, ROUTINE W REFLEX MICROSCOPIC
BILIRUBIN URINE: NEGATIVE
GLUCOSE, UA: NEGATIVE mg/dL
Ketones, ur: NEGATIVE mg/dL
LEUKOCYTES UA: NEGATIVE
NITRITE: NEGATIVE
PH: 5 (ref 5.0–8.0)
Protein, ur: 100 mg/dL — AB
SPECIFIC GRAVITY, URINE: 1.012 (ref 1.005–1.030)

## 2017-10-19 LAB — URINE CULTURE

## 2017-10-19 LAB — BLOOD GAS, ARTERIAL
Acid-base deficit: 12.8 mmol/L — ABNORMAL HIGH (ref 0.0–2.0)
Bicarbonate: 12.5 mmol/L — ABNORMAL LOW (ref 20.0–28.0)
DRAWN BY: 227661
FIO2: 30
LHR: 28 {breaths}/min
MECHVT: 550 mL
O2 SAT: 98.5 %
PATIENT TEMPERATURE: 97.8
PCO2 ART: 26.4 mmHg — AB (ref 32.0–48.0)
PEEP: 5 cmH2O
PO2 ART: 142 mmHg — AB (ref 83.0–108.0)
pH, Arterial: 7.294 — ABNORMAL LOW (ref 7.350–7.450)

## 2017-10-19 LAB — PROCALCITONIN: Procalcitonin: 7.83 ng/mL

## 2017-10-19 LAB — C DIFFICILE QUICK SCREEN W PCR REFLEX
C DIFFICILE (CDIFF) INTERP: NOT DETECTED
C DIFFICILE (CDIFF) TOXIN: NEGATIVE
C Diff antigen: NEGATIVE

## 2017-10-19 LAB — LACTIC ACID, PLASMA: Lactic Acid, Venous: 8.7 mmol/L (ref 0.5–1.9)

## 2017-10-19 LAB — FIBRINOGEN

## 2017-10-19 LAB — MAGNESIUM: MAGNESIUM: 1.4 mg/dL — AB (ref 1.7–2.4)

## 2017-10-19 LAB — STREP PNEUMONIAE URINARY ANTIGEN: Strep Pneumo Urinary Antigen: NEGATIVE

## 2017-10-19 LAB — VALPROIC ACID LEVEL: VALPROIC ACID LVL: 69 ug/mL (ref 50.0–100.0)

## 2017-10-19 LAB — HERPES SIMPLEX VIRUS(HSV) DNA BY PCR
HSV 1 DNA: NEGATIVE
HSV 2 DNA: NEGATIVE

## 2017-10-19 LAB — SAVE SMEAR

## 2017-10-19 LAB — PROTIME-INR: Prothrombin Time: >90 seconds — ABNORMAL HIGH (ref 11.4–15.2)

## 2017-10-19 LAB — MRSA PCR SCREENING: MRSA by PCR: NEGATIVE

## 2017-10-19 LAB — HIV ANTIBODY (ROUTINE TESTING W REFLEX): HIV SCREEN 4TH GENERATION: NONREACTIVE

## 2017-10-19 MED ORDER — DEXTROSE 50 % IV SOLN
1.0000 | Freq: Once | INTRAVENOUS | Status: AC
  Administered 2017-10-19: 50 mL via INTRAVENOUS

## 2017-10-19 MED ORDER — SODIUM CHLORIDE 0.9 % IV SOLN
2.0000 g | Freq: Once | INTRAVENOUS | Status: AC
  Administered 2017-10-19: 2 g via INTRAVENOUS
  Filled 2017-10-19: qty 20

## 2017-10-19 MED ORDER — ALBUMIN HUMAN 5 % IV SOLN
25.0000 g | Freq: Once | INTRAVENOUS | Status: AC
  Administered 2017-10-19: 25 g via INTRAVENOUS
  Filled 2017-10-19: qty 500

## 2017-10-19 MED ORDER — VITAMIN K1 10 MG/ML IJ SOLN
5.0000 mg | Freq: Once | INTRAVENOUS | Status: AC
  Administered 2017-10-19: 5 mg via INTRAVENOUS
  Filled 2017-10-19: qty 0.5

## 2017-10-19 MED ORDER — DEXTROSE 50 % IV SOLN
INTRAVENOUS | Status: AC
  Filled 2017-10-19: qty 50

## 2017-10-19 MED ORDER — VANCOMYCIN HCL IN DEXTROSE 1-5 GM/200ML-% IV SOLN
1000.0000 mg | Freq: Once | INTRAVENOUS | Status: AC
  Administered 2017-10-19: 1000 mg via INTRAVENOUS
  Filled 2017-10-19: qty 200

## 2017-10-19 NOTE — Progress Notes (Signed)
   10/19/17 2100  Provider Notification  Provider Name/Title CCM on call MD  Time Notified 2100  Notification Type Call  Notification Reason Change in status (increased tachycardia)  Response Other (Comment) (MD will assess chart and give orders)  Time of Response 2100

## 2017-10-19 NOTE — Progress Notes (Signed)
PULMONARY / CRITICAL CARE MEDICINE   Name: Glen Johnson MRN: [id number] DOB: [birth year]    ADMISSION DATE:  2017-10-28 CONSULTATION DATE:  8/6  REFERRING MD:  McMannus   CHIEF COMPLAINT:  Acute encephalopathy r/o status epilepticus and acute hypoxic respiratory failure   HISTORY OF PRESENT ILLNESS:    This is a 42 year old male w/ multiple medical problems including: prior TBI (2012),  bipolar disease, spastic left hemiparesis, schizophrenia, and seizure disorder. He resides in a group home. Has had gait and speech difficulty since his TBI in 2012. He is on multiple medications at baseline for his behavior and seizure disorders including: Latuda, seroquel, Remeron, clonazepam, depakote, keppra and buspar. Most recently seen by neurology (Dr [name]) may 2019. During this time exam notable for him being conversant w/ slurred speech, cooperative, he had mild lower extremity rigidity and spastic left upper extremity paralysis.   He presented to the ED at [facility] the am of 8/6 after being found by staff unresponsive and hypoglycemic (CBG 35). EMS was called. They tried glucagon kept dropping down to 29. On arrival he was moaning but non-verbal. He was also hypotensive w/ SBP in 60s and HR 140s. On arrival to ER Rectal temp was 106.5, he was bleeding from oral cavity, and incontinent of stool/diarrhea. He had "gurgling respirations" and had large amounts of thick tan/bloody secretions as well as food particles suctioned from his oral cavity. Oxygen sats on arrival were 77% on room air.   He was pre-oxygenated and subsequently intubated for respiratory failure and ineffective airway protection. There was concern about seizing as the pt's mouth was initially "clamped shut". In ER dx eval notable for: initial CXR was clear w/out infiltrate, was low volume film however, scr 5.80 (up from 2.23 4 months prior), lactic acid of 9.3, total CKs of 3642. Subsequent CBG <10 which again rose to 110 after D50. IV fluids  were continued, he was started on norepinephrine gtt and transferred to [facility] for further diagnostic and supportive care.      SUBJECTIVE:  Currently on vasopressin and Levophed at 40 with a systolic pressure 121.  Vasopressors are weaning at this time. Poor prognosis.   VITAL SIGNS: BP 120/82   Pulse (!) 115   Temp 98.6 F (37 C)   Resp (!) 31   Ht 5\' 11"  (1.803 m)   Wt 213 lb 6.5 oz (96.8 kg)   SpO2 100%   BMI 29.76 kg/m   HEMODYNAMICS: CVP:  [6 mmHg-15 mmHg] 11 mmHg  VENTILATOR SETTINGS: Vent Mode: PRVC FiO2 (%):  [30 %-100 %] 30 % Set Rate:  [14 bmp-28 bmp] 28 bmp Vt Set:  [550 mL-600 mL] 550 mL PEEP:  [5 cmH20] 5 cmH20 Plateau Pressure:  [15 cmH20-22 cmH20] 20 cmH20  INTAKE / OUTPUT: I/O last 3 completed shifts: In: 5183.4 [I.V.:3240.9; IV Piggyback:1942.5] Out: 775 [Urine:775]  PHYSICAL EXAMINATION: General: 42 year old male is currently off sedation and unresponsive. HEENT: Endotracheal tube to ventilator, gastric tube with coffee-ground emesis noted Neuro: Off sedation.  Negative doll's eyes.  Poorly responsive CV: s1s2 rrr, no m/r/g PULM: even/non-labored, lungs bilaterally decreased in the bases GI: Soft with faint bowel sounds Extremities: warm/dry, 1 edema  Skin: no rashes or lesions   LABS:  BMET Recent Labs  Lab 2017-10-28 0847 28-Oct-2017 2213 10/19/17 0347  NA 144 142 143  K 2.8* 3.2* 3.7  CL 110 117* 114*  CO2 9* 10* 12*  BUN 38* 34* 34*  CREATININE 5.80* 5.22* 4.91*  GLUCOSE 137* 127* 191*    Electrolytes Recent Labs  Lab 07-Nov-2017 0847 11/07/17 0855 2017-11-07 2213 10/19/17 0347  CALCIUM 9.2  --  5.9* 5.9*  MG  --  1.8 1.4* 1.4*  PHOS  --   --  2.3* 3.0    CBC Recent Labs  Lab 2017-11-07 0840 11-07-2017 0847 10/19/17 0347  WBC  --  22.4* 10.5  HGB 15.0 14.3 9.6*  HCT 44.0 42.0 30.1*  PLT  --  119* 25*    Coag's No results for input(s): APTT, INR in the last 168 hours.  Sepsis Markers Recent Labs  Lab Nov 07, 2017 0847  07-Nov-2017 1049 11/07/17 2213 11-07-2017 2215 10/19/17 0347  LATICACIDVEN 9.3* 7.6*  --  8.6*  --   PROCALCITON  --   --  10.62  --  7.83    ABG Recent Labs  Lab Nov 07, 2017 1609 2017/11/07 1928 10/19/17 0404  PHART 7.158* 7.174* 7.188*  PCO2ART 27.6* 25.9* 30.6*  PO2ART 192.0* 176.0* 190.0*    Liver Enzymes Recent Labs  Lab 07-Nov-2017 0847 10/19/17 0347  AST 360* 2,177*  ALT 188* 1,591*  ALKPHOS 62 41  BILITOT 0.8 1.2  ALBUMIN 3.5 1.4*    Cardiac Enzymes Recent Labs  Lab 11-07-17 0847 11-07-17 2213 10/19/17 0347  TROPONINI 2.67* 2.52* 2.14*    Glucose Recent Labs  Lab Nov 07, 2017 1430 2017/11/07 1933 2017-11-07 2323 10/19/17 0314 10/19/17 0352 10/19/17 0806  GLUCAP 81 92 81 45* 173* 84    Imaging Ct Head Wo Contrast  Result Date: 11-07-2017 CLINICAL DATA:  New onset seizures. History of traumatic brain injury, LEFT hemiparesis, hypertension and seizures. EXAM: CT HEAD WITHOUT CONTRAST TECHNIQUE: Contiguous axial images were obtained from the base of the skull through the vertex without intravenous contrast. COMPARISON:  CT HEAD September 13, 2015 and MRI of the head January 23, 2015. FINDINGS: BRAIN: No intraparenchymal hemorrhage, mass effect nor midline shift. Moderate parenchymal brain volume loss, advanced within parieto-occipital lobes and cerebellum. No hydrocephalus. Mild blurring of the gray-white matter differentiation. No acute large vascular territory infarct. Basal cisterns patent. VASCULAR: Unremarkable. SKULL/SOFT TISSUES: No skull fracture. No significant soft tissue swelling. EEG leads in place resulting in streak artifact. ORBITS/SINUSES: The included ocular globes and orbital contents are normal.Paranasal sinus mucosal thickening, RIGHT nasogastric tube. Mastoid air cells are well aerated. OTHER: None. IMPRESSION: 1. Poorly differentiated gray-white matter differentiation seen with hypoxic ischemic injury or, potentially artifact from EEG leads. 2. Advanced  parenchymal brain volume loss disproportionately affecting parietoccipital lobes and cerebellum is non-specific though can be secondary to corticobasal degeneration or long-term anti seizure medication. Recommend MRI of the brain on non emergent basis. 3. These results will be called to the ordering clinician or representative by the Radiologist Assistant, and communication documented in the PACS or zVision Dashboard. Electronically Signed   By: Awilda Metro M.D.   On: 11-07-17 17:44   Dg Chest Port 1 View  Result Date: 11-07-17 CLINICAL DATA:  Encounter for central line care EXAM: PORTABLE CHEST 1 VIEW COMPARISON:  11-07-17 FINDINGS: Left subclavian line with tip at the upper cavoatrial junction. No pneumothorax. Endotracheal tube tip is in good position just below the clavicular heads. An orogastric tube reaches the Peri pyloric region. Low volume chest without focal opacity. Normal heart size. IMPRESSION: The new subclavian line is in good position.  No pneumothorax. Electronically Signed   By: Marnee Spring M.D.   On: Nov 07, 2017 15:57   Dg Chest Port 1 View  Result Date: 11-07-17 CLINICAL  DATA:  Fevers EXAM: PORTABLE CHEST 1 VIEW COMPARISON:  09/13/2015 FINDINGS: The heart size and mediastinal contours are within normal limits. Both lungs are clear. The visualized skeletal structures are unremarkable. IMPRESSION: No active disease. Electronically Signed   By: Alcide CleverMark  Lukens M.D.   On: 09/17/17 09:08   Dg Chest Port 1v Same Day  Result Date: 11/08/2017 CLINICAL DATA:  Intubated. EXAM: PORTABLE CHEST 1 VIEW COMPARISON:  Chest radiograph 09/17/17 FINDINGS: Endotracheal tube in good position approximately 4 cm from carina. NG tube extends the stomach. Lungs are clear. No acute osseous abnormality. IMPRESSION: Endotracheal tube and NG tube in good position Electronically Signed   By: Genevive BiStewart  Edmunds M.D.   On: 09/17/17 10:13     STUDIES:  CT head 8/6>>> poorly differentiated  gray-white matter seen with hypoxic ischemic injury.  Advanced parenchymal brain volume loss is proportionate.  CULTURES: Blood culture 8/6>>> Sputum culture 8/6>>> Urine culture 8/6>>> CNS 8/6>>>  ANTIBIOTICS: vanc 8/6>>> Rocephin 8/6>>> 10/21/2017 acyclovir>>  SIGNIFICANT EVENTS:   LINES/TUBES: oett 8/5>>> 11/03/2017 left subclavian central>> 10/19/2017 right femoral A-line?>>  DISCUSSION: 42 year old male patient with history of schizophrenia, bipolar disease, prior TBI in 2012 leaving him with seizure disorder, as well as left upper extremity spastic paresis.  At baseline he is ambulatory with a cane/walker and has good speech.  He presents comatose, febrile as high as 106, possibly seizing and in shock.  We will admit him with working diagnosis of sepsis, with concern for CNS infection.  Unclear if he is actively seizing at this point.  He needs stat CT head, aggressive volume resuscitation, correction of acidosis, broad-spectrum antibiotics, and lumbar puncture.  We will also ask neurology to assist with his care.     ASSESSMENT / PLAN:  Acute encephalopathy/COMA. ? Status epilepticus ? Vs post-ictal; ? Meningitis or CNS infection. likely complicated by metabolic derangements: severe hypoglycemia, likely acidosis and sepsis also contributing.  h/o TBI, seizure d/o (worse since TBI in 2012), schizophrenia and bipolar disease.  Plan Minimize sedation Neurology is following Consider MRI  High fever. Severe sepsis/septic shock. Source not clear. May have aspirated but would be a complicating infection, doubt cause. ? CNS vs some other systemic infection.  NMS doubtful  Plan Antimicrobial antiviral therapy on 08/7 2019 consists of acyclovir, ceftriaxone, vancomycin. Panculture Narrow antibiotics as able   Mild troponin elevation-->suspect demand related. Difficult to interpret  Plan Monitor troponin Questionable need for 2D echo  Acute Hypoxic respiratory failure in setting  of ineffective airway clearance. Likely complicated by aspiration event.   Plan Full vent support Sputum culture Serial chest x-ray   Severe metabolic acidosis + AG, w/ marked lactic acidosis.  -LA cleared from 9.3 to 7.6. Suspect that this is a mix of sepsis/hypoperfusion as well as possible post-seizure related. Plan Volume resuscitation Repeat lactic acid ordered ABG is ordered  Epistaxis->packed nasally  Plan Packing in place No active bleeding as of 10/19/2017  Profound hypoglycemia.  CBG (last 3)  Recent Labs    10/19/17 0314 10/19/17 0352 10/19/17 0806  GLUCAP 45* 173* 84    Has been as low as 29. He is not on any hypoglycemics or insulins. ? Etiology. Certainly could have exacerbated seizures.  Plan Follow CBGs Currently on D5W with sodium bicarb   Acute on chronic renal failure (baseline CKD stage III). Suspect AKI exacerbated by Rhabdo.  Nml creatinine 2.23 at baseline. Now 5.8. Total CKs > 3000 Lab Results  Component Value Date   CREATININE 4.91 (H) 10/19/2017  CREATININE 5.22 (H) 10/27/17   CREATININE 5.80 (H) 2017-10-27    Plan Bicarb drip Monitor electrolytes Will order renal ultrasound for completeness Recheck ABGs Avoid nephrotoxins  Fluid and electrolyte imbalance: hypokalemia  Recent Labs  Lab Oct 27, 2017 0847 2017/10/27 2213 10/19/17 0347  K 2.8* 3.2* 3.7    Plan Monitor and replete as needed  Elevated LFTs. Likely shock liver Plan Continue to trend LFTs   thrombocytopenia ->likely sepsis  119->25 10/19/17 Plan Trend CBC Avoid anticoagulation 10/19/2017 check PT and PTT pending  DVT prophylaxis: SCD SUP: H2B  Diet: NPO Activity: BR Disposition : ICU  cct 30 min   Brett Canales Raima Geathers ACNP Adolph Pollack PCCM Pager [phone] till 1 pm If no answer page [phone] 10/19/2017, 8:16 AM

## 2017-10-19 NOTE — Progress Notes (Signed)
Initial Nutrition Assessment  DOCUMENTATION CODES:   Not applicable  INTERVENTION:   Recommend initiate Osmolite 1.5 @ 65 ml with 30 ml Prostat BID via OG tube Provides: 2540 kcal, 127 grams protein, and 1188 ml free water.    NUTRITION DIAGNOSIS:   Increased nutrient needs related to (sepsis) as evidenced by estimated needs.  GOAL:   Patient will meet greater than or equal to 90% of their needs  MONITOR:   Vent status, I & O's  REASON FOR ASSESSMENT:   Ventilator    ASSESSMENT:   Pt with PMH of TBI (2012) with gait and speech difficulty, mild lower extremity rigidity and spastic L upper extremity paralysis, bipolar dz, spastic L hemiparesis, schizophrenia, and sz disorder who lives in a group home admitted with sepsis with concern for CNS infection.    Pt discussed during ICU rounds and with RN.  Per RN O2 sats dropped during bath now back up  Admission weight: 197 lb (89.5 kg)   Patient is currently intubated on ventilator support MV: 17.2 L/min Temp (24hrs), Avg:97.1 F (36.2 C), Min:95.9 F (35.5 C), Max:98.8 F (37.1 C)  Medications reviewed and include:  Levophed @ 25 mcg, neosynephrine off, vasopressin .02 u Labs reviewed: INR: > 10, AST: 3066, ALT: 2551    NUTRITION - FOCUSED PHYSICAL EXAM:    Most Recent Value  Orbital Region  No depletion  Upper Arm Region  No depletion  Thoracic and Lumbar Region  No depletion  Buccal Region  Unable to assess  Temple Region  No depletion  Clavicle Bone Region  No depletion  Clavicle and Acromion Bone Region  No depletion  Scapular Bone Region  Unable to assess  Dorsal Hand  No depletion  Patellar Region  No depletion  Anterior Thigh Region  No depletion  Posterior Calf Region  No depletion  Edema (RD Assessment)  None  Hair  Reviewed  Eyes  Unable to assess  Mouth  Unable to assess  Skin  Reviewed  Nails  Reviewed       Diet Order:   Diet Order           Diet NPO time specified  Diet effective  now          EDUCATION NEEDS:   No education needs have been identified at this time  Skin:  Skin Assessment: Reviewed RN Assessment  Last BM:  liquid stool via rectal tube  Height:   Ht Readings from Last 1 Encounters:  08-Sep-2017 5\' 11"  (1.803 m)    Weight:   Wt Readings from Last 1 Encounters:  10/19/17 213 lb 6.5 oz (96.8 kg)    Ideal Body Weight:  78.1 kg  BMI:  Body mass index is 29.76 kg/m.  Estimated Nutritional Needs:   Kcal:  2530  Protein:  110-135 grams  Fluid:  > 2 L/day  Kendell BaneHeather Raguel Kosloski RD, LDN, CNSC [phone] Pager [phone] After Hours Pager

## 2017-10-19 NOTE — Progress Notes (Signed)
eLink Physician-Brief Progress Note Patient Name: Glen Johnson DOB: [birth year] MRN: [id number]   Date of Service  10/19/2017  HPI/Events of Note  Total Calcium 5.9, Ionized calcium unknown, Serum Albumin  was 3.5 a few days ago.  eICU Interventions  Ionized calcium ordered, Calcium gluconate 2 gm iv x 1 pending result of Ca 2+        [name] U Wadsworth Skolnick 10/19/2017, 10:46 PM

## 2017-10-19 NOTE — Procedures (Signed)
LTM-EEG Report  HISTORY: Continuous video-EEG monitoring performed for 42 year old with TBI, altered mental status, seizures.  ACQUISITION: International 10-20 system for electrode placement; 18 channels with additional eyes linked to ipsilateral ears and EKG. Additional T1-T2 electrodes were used. Continuous video recording obtained.   EEG NUMBER: MEDICATIONS:  Day 1: VPA, LEV, LCM, MDZ, propofol  DAY #1: from 1632 11/03/2017 to 0730 10/19/17 BACKGROUND: An overall medium voltage continuous recording with poor spontaneous variability and reactivity. The background consisted of medium voltage theta-delta activity bilaterally with sparse superimposed faster frequencies. No clear posterior basic rhythm was observed. State changes were captured with some sleep spindles bilaterally. There was considerable movement and electrode artifact at times.  EPILEPTIFORM/PERIODIC ACTIVITY: None, aside from brief possible ictal activity as below. SEIZURES: One possible complex partial seizure with right gaze deviation and oral automatisms associated with focal sharply contoured slowing in the left temporal region. EVENTS: There were two events of facial twitching identified. The first occurred in the early evening (1800) and was associated with a right gaze deviation. There was possible EEG correlate with some focal left temporal sharply contoured slowing. The 2nd event occurred in the late evening and did not have an apparent gaze deviation or EEG correlate.   EKG: no significant arrhythmia  SUMMARY: This was an abnormal continuous video EEG due to generalized background slowing and an event of right gaze deviation and oral automatisms with possible left temporal ictal changes. A 2nd event of facial/mouth twitching did not have EEG correlate however.

## 2017-10-19 NOTE — Progress Notes (Signed)
RT ran ABG with I-stat, criticals given to Weston BrassNick, RN, RN to notify MD.  No RT intervention at this time.

## 2017-10-19 NOTE — Progress Notes (Signed)
eLink Physician-Brief Progress Note Patient Name: Glen Johnson DOB: 1975/06/11 MRN: [id number]   Date of Service  10/19/2017  HPI/Events of Note  Hypotension and tachycardia in the context of a clinical picture of distributive shock suggestive of septic shock. Pt also has elevated troponin c/w myocardial injury. Pt is coagulopathic c/w  DIC precluding anticoagulation.  eICU Interventions  Check f/u Lactic acid, 5 % Albumin 25 gm iv fluid bolus x 1 now, Echocardiogram to check LV function r/o RWMA, trend Troponin        Othella Slappey U Othniel Maret 10/19/2017, 9:24 PM

## 2017-10-19 NOTE — Progress Notes (Addendum)
Subjective: One possible seizure overnight.   Now with low plt, anemia, ? Dilutional effect given low albumin   Exam: Vitals:   10/19/17 0800 10/19/17 0900  BP: 120/82 (!) 116/95  Pulse:    Resp: (!) 31 (!) 33  Temp: 98.6 F (37 C) 98.4 F (36.9 C)  SpO2:     Gen: In bed, intubated Resp: ventilated Abd: soft, nt  Neuro: MS: Opens eyes partially to noxious sitmuli, does not follow commands CN:L pupil minimally larger than right, blinks to eyelid stim Motor: flexion bilateral upper extremities, no movements in the lower Sensory:as above  Pertinent Labs: Calcium corrects to 8.0 Elevated Cr  Impression: 42 yo M found down with low glucose, sepsis. Possible seizure reported. The concern for status on arrival was not born out on connection to continuous EEG. Possibilities include TTP, hypoglycemic injury, diffuse anoxic injury, septic encephalopathy.   Recommendations: 1) Smear to eval for TTP 2) continue LEV 500mg  IBD(renally dosed) 3) continue vimpat 150mg  IBD(max dose) 4) continue VPA 500mg  TID  This patient is critically ill and at significant risk of neurological worsening, death and care requires constant monitoring of vital signs, hemodynamics,respiratory and cardiac monitoring, neurological assessment, discussion with family, other specialists and medical decision making of high complexity. I spent 35 minutes of neurocritical care time  in the care of  this patient.  Ritta SlotMcNeill Kirkpatrick, MD [facility] [phone]  If 7pm- 7am, please page neurology on call as listed in AMION. 10/20/2017  7:40 AM

## 2017-10-19 NOTE — Progress Notes (Signed)
Spoke with on call neuro MD regarding possible seizure activity.  Neuro ordered that we start propofol gtt.  Consulted CCM and was told to start propofol.

## 2017-10-19 NOTE — Progress Notes (Signed)
   10/19/17 [id number]  Provider Notification  Provider Name/Title Dr. Sung AmabileSimonds  Time Notified [phone]  Notification Type Call  Notification Reason Change in status (Critical Lab: Platelet Count:25)  Response Other (Comment) (MD will assess chart )  Time of Response [phone]

## 2017-10-20 ENCOUNTER — Inpatient Hospital Stay (HOSPITAL_COMMUNITY): Payer: Medicaid Other

## 2017-10-20 DIAGNOSIS — Z452 Encounter for adjustment and management of vascular access device: Secondary | ICD-10-CM

## 2017-10-20 DIAGNOSIS — R6521 Severe sepsis with septic shock: Secondary | ICD-10-CM

## 2017-10-20 DIAGNOSIS — R569 Unspecified convulsions: Secondary | ICD-10-CM

## 2017-10-20 LAB — BASIC METABOLIC PANEL
ANION GAP: 25 — AB (ref 5–15)
ANION GAP: 27 — AB (ref 5–15)
BUN: 38 mg/dL — ABNORMAL HIGH (ref 6–20)
BUN: 39 mg/dL — ABNORMAL HIGH (ref 6–20)
CALCIUM: 6.1 mg/dL — AB (ref 8.9–10.3)
CHLORIDE: 102 mmol/L (ref 98–111)
CO2: 13 mmol/L — AB (ref 22–32)
CO2: 14 mmol/L — AB (ref 22–32)
CREATININE: 5.86 mg/dL — AB (ref 0.61–1.24)
Calcium: 5.9 mg/dL — CL (ref 8.9–10.3)
Chloride: 105 mmol/L (ref 98–111)
Creatinine, Ser: 5.26 mg/dL — ABNORMAL HIGH (ref 0.61–1.24)
GFR calc Af Amer: 14 mL/min — ABNORMAL LOW (ref >60)
GFR calc Af Amer: 12 mL/min — ABNORMAL LOW (ref >60)
GFR calc non Af Amer: 12 mL/min — ABNORMAL LOW (ref >60)
GFR calc non Af Amer: 11 mL/min — ABNORMAL LOW (ref >60)
GLUCOSE: 107 mg/dL — AB (ref 70–99)
GLUCOSE: 99 mg/dL (ref 70–99)
POTASSIUM: 3.7 mmol/L (ref 3.5–5.1)
Potassium: 3.5 mmol/L (ref 3.5–5.1)
Sodium: 143 mmol/L (ref 135–145)
Sodium: 143 mmol/L (ref 135–145)

## 2017-10-20 LAB — CBC WITH DIFFERENTIAL/PLATELET
Band Neutrophils: 0 %
Basophils Absolute: 0 10*3/uL (ref 0.0–0.1)
Basophils Relative: 0 %
Blasts: 0 %
EOS PCT: 0 %
Eosinophils Absolute: 0 10*3/uL (ref 0.0–0.7)
HEMATOCRIT: 28 % — AB (ref 39.0–52.0)
Hemoglobin: 9.2 g/dL — ABNORMAL LOW (ref 13.0–17.0)
LYMPHS ABS: 0.7 10*3/uL (ref 0.7–4.0)
Lymphocytes Relative: 4 %
MCH: 31.5 pg (ref 26.0–34.0)
MCHC: 32.9 g/dL (ref 30.0–36.0)
MCV: 95.9 fL (ref 78.0–100.0)
MONOS PCT: 1 %
Metamyelocytes Relative: 0 %
Monocytes Absolute: 0.2 10*3/uL (ref 0.1–1.0)
Myelocytes: 0 %
NEUTROS ABS: 17.2 10*3/uL — AB (ref 1.7–7.7)
NEUTROS PCT: 95 %
NRBC: 0 /100{WBCs}
Other: 0 %
Platelets: 37 10*3/uL — ABNORMAL LOW (ref 150–400)
Promyelocytes Relative: 0 %
RBC: 2.92 MIL/uL — AB (ref 4.22–5.81)
RDW: 15.5 % (ref 11.5–15.5)
WBC Morphology: INCREASED
WBC: 18.1 10*3/uL — AB (ref 4.0–10.5)

## 2017-10-20 LAB — GLUCOSE, CAPILLARY
GLUCOSE-CAPILLARY: 55 mg/dL — AB (ref 70–99)
GLUCOSE-CAPILLARY: 75 mg/dL (ref 70–99)
GLUCOSE-CAPILLARY: 85 mg/dL (ref 70–99)
Glucose-Capillary: 101 mg/dL — ABNORMAL HIGH (ref 70–99)
Glucose-Capillary: 90 mg/dL (ref 70–99)
Glucose-Capillary: 92 mg/dL (ref 70–99)
Glucose-Capillary: 110 mg/dL — ABNORMAL HIGH (ref 70–99)
Glucose-Capillary: 51 mg/dL — ABNORMAL LOW (ref 70–99)

## 2017-10-20 LAB — HEMOGLOBIN AND HEMATOCRIT, BLOOD
HCT: 22.8 % — ABNORMAL LOW (ref 39.0–52.0)
HEMOGLOBIN: 7.6 g/dL — AB (ref 13.0–17.0)

## 2017-10-20 LAB — PREPARE RBC (CROSSMATCH)

## 2017-10-20 LAB — POCT I-STAT 7, (LYTES, BLD GAS, ICA,H+H)
Acid-base deficit: 9 mmol/L — ABNORMAL HIGH (ref 0.0–2.0)
Bicarbonate: 15 mmol/L — ABNORMAL LOW (ref 20.0–28.0)
Calcium, Ion: 0.75 mmol/L — CL (ref 1.15–1.40)
HEMATOCRIT: 19 % — AB (ref 39.0–52.0)
Hemoglobin: 6.5 g/dL — CL (ref 13.0–17.0)
O2 SAT: 98 %
PCO2 ART: 24.2 mmHg — AB (ref 32.0–48.0)
Potassium: 3.6 mmol/L (ref 3.5–5.1)
SODIUM: 145 mmol/L (ref 135–145)
TCO2: 16 mmol/L — AB (ref 22–32)
pH, Arterial: 7.398 (ref 7.350–7.450)
pO2, Arterial: 106 mmHg (ref 83.0–108.0)

## 2017-10-20 LAB — BLOOD GAS, ARTERIAL
Acid-base deficit: 9.8 mmol/L — ABNORMAL HIGH (ref 0.0–2.0)
BICARBONATE: 14.5 mmol/L — AB (ref 20.0–28.0)
Drawn by: 364961
FIO2: 0.3
LHR: 30 {breaths}/min
O2 SAT: 98.3 %
PATIENT TEMPERATURE: 98.6
PCO2 ART: 25.9 mmHg — AB (ref 32.0–48.0)
PEEP: 5 cmH2O
PH ART: 7.367 (ref 7.350–7.450)
VT: 600 mL
pO2, Arterial: 117 mmHg — ABNORMAL HIGH (ref 83.0–108.0)

## 2017-10-20 LAB — RENAL FUNCTION PANEL
ANION GAP: 29 — AB (ref 5–15)
Albumin: 2.2 g/dL — ABNORMAL LOW (ref 3.5–5.0)
BUN: 40 mg/dL — ABNORMAL HIGH (ref 6–20)
CHLORIDE: 102 mmol/L (ref 98–111)
CO2: 14 mmol/L — AB (ref 22–32)
Calcium: 5.8 mg/dL — CL (ref 8.9–10.3)
Creatinine, Ser: 6.29 mg/dL — ABNORMAL HIGH (ref 0.61–1.24)
GFR calc Af Amer: 11 mL/min — ABNORMAL LOW (ref >60)
GFR calc non Af Amer: 10 mL/min — ABNORMAL LOW (ref >60)
Glucose, Bld: 82 mg/dL (ref 70–99)
Phosphorus: 7.6 mg/dL — ABNORMAL HIGH (ref 2.5–4.6)
Potassium: 3.8 mmol/L (ref 3.5–5.1)
Sodium: 145 mmol/L (ref 135–145)

## 2017-10-20 LAB — ACETAMINOPHEN LEVEL

## 2017-10-20 LAB — CBC
HCT: 29.4 % — ABNORMAL LOW (ref 39.0–52.0)
HEMOGLOBIN: 9.9 g/dL — AB (ref 13.0–17.0)
MCH: 32.1 pg (ref 26.0–34.0)
MCHC: 33.7 g/dL (ref 30.0–36.0)
MCV: 95.5 fL (ref 78.0–100.0)
PLATELETS: 22 10*3/uL — AB (ref 150–400)
RBC: 3.08 MIL/uL — AB (ref 4.22–5.81)
RDW: 15.1 % (ref 11.5–15.5)
WBC: 20 10*3/uL — ABNORMAL HIGH (ref 4.0–10.5)

## 2017-10-20 LAB — ECHOCARDIOGRAM COMPLETE
HEIGHTINCHES: 71 in
Weight: 3548.52 oz

## 2017-10-20 LAB — LACTIC ACID, PLASMA
LACTIC ACID, VENOUS: 11.1 mmol/L — AB (ref 0.5–1.9)
LACTIC ACID, VENOUS: 19.1 mmol/L — AB (ref 0.5–1.9)

## 2017-10-20 LAB — AMMONIA: Ammonia: 108 umol/L — ABNORMAL HIGH (ref 9–35)

## 2017-10-20 LAB — TROPONIN I: Troponin I: 2.5 ng/mL (ref <0.03)

## 2017-10-20 LAB — HAPTOGLOBIN: Haptoglobin: <10 mg/dL — ABNORMAL LOW (ref 34–200)

## 2017-10-20 LAB — MYOGLOBIN, URINE: Myoglobin, Ur: 399 ng/mL — ABNORMAL HIGH (ref 0–13)

## 2017-10-20 LAB — PROTIME-INR
INR: >10
Prothrombin Time: >90 seconds — ABNORMAL HIGH (ref 11.4–15.2)
Prothrombin Time: >90 seconds — ABNORMAL HIGH (ref 11.4–15.2)

## 2017-10-20 LAB — PROCALCITONIN: Procalcitonin: 5.86 ng/mL

## 2017-10-20 LAB — ABO/RH: ABO/RH(D): O POS

## 2017-10-20 LAB — CK: Total CK: 37093 U/L — ABNORMAL HIGH (ref 49–397)

## 2017-10-20 LAB — PATHOLOGIST SMEAR REVIEW

## 2017-10-20 LAB — CBG MONITORING, ED: Glucose-Capillary: <10 mg/dL — CL (ref 70–99)

## 2017-10-20 LAB — FIBRINOGEN: FIBRINOGEN: 62 mg/dL — AB (ref 210–475)

## 2017-10-20 LAB — PHOSPHORUS: Phosphorus: 5.6 mg/dL — ABNORMAL HIGH (ref 2.5–4.6)

## 2017-10-20 LAB — APTT: APTT: 75 s — AB (ref 24–36)

## 2017-10-20 LAB — MAGNESIUM: Magnesium: 1.3 mg/dL — ABNORMAL LOW (ref 1.7–2.4)

## 2017-10-20 MED ORDER — VITAMIN K1 10 MG/ML IJ SOLN
10.0000 mg | Freq: Once | INTRAMUSCULAR | Status: AC
  Administered 2017-10-21: 10 mg via SUBCUTANEOUS
  Filled 2017-10-20 (×2): qty 1

## 2017-10-20 MED ORDER — SODIUM CHLORIDE 0.9% IV SOLUTION
Freq: Once | INTRAVENOUS | Status: AC
  Administered 2017-10-20: via INTRAVENOUS

## 2017-10-20 MED ORDER — DEXTROSE 50 % IV SOLN
25.0000 mL | Freq: Once | INTRAVENOUS | Status: AC
  Administered 2017-10-20: 25 mL via INTRAVENOUS
  Filled 2017-10-20: qty 50

## 2017-10-20 MED ORDER — HEPARIN SODIUM (PORCINE) 1000 UNIT/ML DIALYSIS
1000.0000 [IU] | INTRAMUSCULAR | Status: DC | PRN
  Filled 2017-10-20: qty 6

## 2017-10-20 MED ORDER — SODIUM CHLORIDE 0.9 % IV SOLN
2.0000 g | Freq: Once | INTRAVENOUS | Status: AC
  Administered 2017-10-20: 2 g via INTRAVENOUS
  Filled 2017-10-20: qty 20

## 2017-10-20 MED ORDER — PRISMASOL BGK 4/2.5 32-4-2.5 MEQ/L IV SOLN
INTRAVENOUS | Status: DC
  Administered 2017-10-20 – 2017-10-21 (×2): via INTRAVENOUS_CENTRAL

## 2017-10-20 MED ORDER — SODIUM CHLORIDE 0.9 % FOR CRRT
INTRAVENOUS_CENTRAL | Status: DC | PRN
  Filled 2017-10-20: qty 1000

## 2017-10-20 MED ORDER — SODIUM CHLORIDE 0.9% IV SOLUTION: Freq: Once | INTRAVENOUS | Status: DC

## 2017-10-20 MED ORDER — DEXTROSE 50 % IV SOLN
25.0000 mL | Freq: Once | INTRAVENOUS | Status: AC
  Administered 2017-10-21: 25 mL via INTRAVENOUS

## 2017-10-20 MED ORDER — VITAL HIGH PROTEIN PO LIQD: 1000.0000 mL | ORAL | Status: DC

## 2017-10-20 MED ORDER — OXEPA PO LIQD
1000.0000 mL | ORAL | Status: DC
  Administered 2017-10-20 (×2): 1000 mL
  Filled 2017-10-20: qty 1000

## 2017-10-20 MED ORDER — ALBUMIN HUMAN 5 % IV SOLN
25.0000 g | Freq: Once | INTRAVENOUS | Status: AC
  Administered 2017-10-20: 25 g via INTRAVENOUS
  Filled 2017-10-20: qty 500

## 2017-10-20 MED ORDER — PRISMASOL BGK 4/2.5 32-4-2.5 MEQ/L IV SOLN
INTRAVENOUS | Status: DC
  Administered 2017-10-20 – 2017-10-21 (×3): via INTRAVENOUS_CENTRAL

## 2017-10-20 MED ORDER — CEFTRIAXONE SODIUM 2 G IJ SOLR
2.0000 g | INTRAMUSCULAR | Status: DC
  Administered 2017-10-21 – 2017-10-23 (×3): 2 g via INTRAVENOUS
  Filled 2017-10-20 (×3): qty 20

## 2017-10-20 MED ORDER — PRISMASOL BGK 4/2.5 32-4-2.5 MEQ/L IV SOLN
INTRAVENOUS | Status: DC
  Administered 2017-10-20 – 2017-10-23 (×14): via INTRAVENOUS_CENTRAL

## 2017-10-20 MED ORDER — VANCOMYCIN HCL IN DEXTROSE 1-5 GM/200ML-% IV SOLN: 1000.0000 mg | INTRAVENOUS | Status: DC

## 2017-10-20 NOTE — Procedures (Signed)
Central Venous Catheter Insertion Procedure Note Elam Dutchravis Bridgett [id number] 02-22-1976  Procedure: Insertion of Central Venous Catheter Indications: Renal replacement therapy.  Procedure Details Consent: Risks of procedure as well as the alternatives and risks of each were explained to the (patient/caregiver).  Consent for procedure obtained. Time Out: Verified patient identification, verified procedure, site/side was marked, verified correct patient position, special equipment/implants available, medications/allergies/relevent history reviewed, required imaging and test results available.  Performed  Maximum sterile technique was used including antiseptics, cap, gloves, gown, hand hygiene, mask and sheet. Skin prep: Chlorhexidine; local anesthetic administered A antimicrobial bonded/coated double lumen catheter was placed in the left femoral vein due to patient being a dialysis patient using the Seldinger technique.    7F Power Trialysis line inserted under ultrasound guidance.  Artery hit on first pass with finder needle.  Guidewire pulled on second pass because vessel not clearly seen to be compressible. 3rd pass wire clearly seen in compressible vessel.  Needle transduced showing venous flow. Line inserted without difficulty and flushed with ease.  Evaluation Blood flow good Complications: No apparent complications Patient did tolerate procedure well. Chest X-ray ordered to verify placement.  CXR: Not applicable.Daleen Bo.  Rohin Krejci 10/20/2017, 12:59 PM
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
## 2017-10-20 NOTE — Progress Notes (Signed)
eLink Physician-Brief Progress Note Patient Name: Glen Johnson DOB: Oct 20, 1975 MRN: [id number]   Date of Service  10/20/2017  HPI/Events of Note  Lactic acid up to 11.1 on recheck.  eICU Interventions  Stat abdomen/ pelvis CT to exclude an acute surgical abdomen, repeat Albumin 5 % 500 ml iv bolus        [name] U [name] 10/20/2017, 12:34 AM

## 2017-10-20 NOTE — Progress Notes (Signed)
LTM EEG checked, no skin breakdown seen at Fp1 or Fp2 

## 2017-10-20 NOTE — Progress Notes (Signed)
Patient was transported to CT and back without incident. The patient is resting on his ordered vent settings at this time.  

## 2017-10-20 NOTE — Progress Notes (Signed)
RT NOTE:  Pt transported to CT and back to 4N without event. Pt ETT suctioned prior to transport and upon return.

## 2017-10-20 NOTE — Procedures (Signed)
LTM-EEG Report  HISTORY: Continuous video-EEG monitoring performed for 42 year old with TBI, altered mental status, seizures.  ACQUISITION: International 10-20 system for electrode placement; 18 channels with additional eyes linked to ipsilateral ears and EKG. Additional T1-T2 electrodes were used. Continuous video recording obtained.   EEG NUMBER: MEDICATIONS:  Day 1: VPA, LEV, LCM, MDZ, propofol Day 2: see EMR  DAY #1: from 1632 10/29/2017 to 0730 10/19/17 BACKGROUND: An overall medium voltage continuous recording with poor spontaneous variability and reactivity. The background consisted of medium voltage theta-delta activity bilaterally with sparse superimposed faster frequencies. No clear posterior basic rhythm was observed. State changes were captured with some sleep spindles bilaterally. There was considerable movement and electrode artifact at times.  EPILEPTIFORM/PERIODIC ACTIVITY: None, aside from brief possible ictal activity as below. SEIZURES: One possible complex partial seizure with right gaze deviation and oral automatisms associated with focal sharply contoured slowing in the left temporal region. EVENTS: There were two events of facial twitching identified. The first occurred in the early evening (1800) and was associated with a right gaze deviation. There was possible EEG correlate with some focal left temporal sharply contoured slowing. The 2nd event occurred in the late evening and did not have an apparent gaze deviation or EEG correlate.   DAY #2: from 0730 10/19/17 to 0730 10/20/17 BACKGROUND: An overall medium voltage continuous recording with poor spontaneous variability and reactivity. The background consisted of medium voltage theta-delta activity bilaterally with sparse superimposed faster frequencies. No clear posterior basic rhythm was observed. State changes were captured with some sleep spindles bilaterally.  EPILEPTIFORM/PERIODIC ACTIVITY: None SEIZURES: none EVENTS:  There was one event at 0500 with possible facial twitching without EEG correlate.   EKG: no significant arrhythmia  SUMMARY: This was an abnormal continuous video EEG due to generalized background slowing, indicative of a diffuse cerebral disturbance. There were no epileptiform discharges or seizures seen. One event overnight with facial twitching did not have EEG correlate.

## 2017-10-20 NOTE — Progress Notes (Signed)
  Echocardiogram 2D Echocardiogram has been performed.  Glen ChimesWendy  Dmario Johnson 10/20/2017, 9:07 AM

## 2017-10-20 NOTE — Progress Notes (Signed)
Subjective: No seizures  Exam: Vitals:   10/20/17 1830 10/20/17 1900  BP: (!) 103/58 (!) 95/56  Pulse: (!) 112 (!) 112  Resp: (!) 31 (!) 34  Temp: (!) 97.3 F (36.3 C) (!) 97.2 F (36.2 C)  SpO2: 98% 99%   Gen: In bed, intubated Resp: ventilated Abd: soft, nt  Neuro: MS: Opens eyes partially to noxious sitmuli, does not follow commands CN:L pupil minimally larger than right, corneals intact Motor: flexion bilateral upper extremities, no movements in the lower Sensory:as above  Pertinent Labs: Elevated INR, LFTs  Impression: 42 yo M found down with low glucose, sepsis. Possible seizure reported. The concern for status on arrival was not born out on connection to continuous EEG. Possibilities include TTP, hypoglycemic injury, diffuse anoxic injury, septic encephalopathy.   Recommendations: 1) continue LEV 500mg  IBD(renally dosed) 2) continue vimpat 150mg  BID(max dose) 3) continue VPA 500mg  TID 4) VPA level in the AM  This patient is critically ill and at significant risk of neurological worsening, death and care requires constant monitoring of vital signs, hemodynamics,respiratory and cardiac monitoring, neurological assessment, discussion with family, other specialists and medical decision making of high complexity. I spent 40 minutes of neurocritical care time  in the care of  this patient.  Ritta SlotMcNeill Phallon Haydu, MD [facility] [phone]  If 7pm- 7am, please page neurology on call as listed in AMION. 10/20/2017  7:22 PM

## 2017-10-20 NOTE — Progress Notes (Signed)
Hypoglycemic Event  CBG: 56  Treatment: 25 ml of d50,   Symptoms: none  Follow-up CBG: Time:2010 CBG Result:110  Possible Reasons for Event: Tube feeding was ordered but not yet started.   Comments/MD notified:Informed Dr. Molli KnockYacoub at E link, no new orders.    Corliss MarcusLilley, Keyron Pokorski R

## 2017-10-20 NOTE — Consult Note (Signed)
G[facility] Johnson Admit Date: 10/26/2017 10/20/2017 Glen Johnson Requesting Physician:  Glen Mare MD  Reason for Consult:  AKI, Acidosis, Hypervolemia HPI:  42 year old male admitted 8/6 after presenting from his group home unresponsive, severely hypoglycemic, found to be hypotensive, tachycardic, febrile, and hypoxic.  His past history includes a traumatic brain injury in 2012, bipolar affective disorder, left hemiparesis, seizure disorder.  Upon arrival he was treated for septic/distributive shock requiring vasopressors.  Intubated because of his mental status.  Found to have severe coagulopathy and severe thrombocytopenia with anemia.  Discussed with hematology and felt not to represent TTP.  Has massive transaminitis.  Troponins have been elevated, TTE pending.  CK was elevated at 3641 upon arrival.  Has persistent lactic acidosis despite these measures.  Remains on norepinephrine.  More than 10 L net positive, 0.5 L urine output yesterday,  5.8 L input.  Renal ultrasound at admission negative without evidence of hydronephrosis or structural renal disease.  Baseline serum creatinine appears to be around 2.2.  Urinalysis with 2+ protein, no significant hematuria.  Creatinine, Ser (mg/dL)  Date Value  10/20/2017 5.86 (H)  10/20/2017 5.26 (H)  10/19/2017 4.96 (H)  10/19/2017 4.83 (H)  10/19/2017 4.77 (H)  10/19/2017 4.91 (H)  10/15/2017 5.22 (H)  10/14/2017 5.80 (H)  10/14/2017 5.50 (H)  05/31/2017 2.23 (H)  ] I/Os: I/O last 3 completed shifts: In: 9178.1 [I.V.:5873.5; IV Piggyback:3304.6] Out: 46 [Urine:1180; Stool:300]   ROS NSAIDS: No exposure identified IV Contrast no exposure TMP/SMX no exposure Hypotension present since admission requiring vasopressors Balance of 12 systems is negative w/ exceptions as above  PMH  Past Medical History:  Diagnosis Date  . Bipolar disorder ([place])   . Depression   . Hypertension   . Left spastic hemiparesis ([place])   . Lives in group  home   . Outbursts of anger   . Renal disorder    chronic kidney disease, stage II  . Schizophrenia ([place])   . Seizures ([place])   . TBI (traumatic brain injury) ([place]) 2012   unsteady gait, speech difficulty   PSH  Past Surgical History:  Procedure Laterality Date  . TRACHEOSTOMY    . TRACHEOSTOMY CLOSURE     FH  Family History  Problem Relation Age of Onset  . Seizures Mother   . Migraines Mother   . Arthritis Mother    SH  reports that he has quit smoking. He has never used smokeless tobacco. He reports that he does not drink alcohol or use drugs. Allergies No Known Allergies Home medications Prior to Admission medications   Medication Sig Start Date End Date Taking? Authorizing Provider  acetaminophen (TYLENOL) 325 MG tablet Take 650 mg by mouth every 6 (six) hours as needed for mild pain.   Yes [provider]  amLODipine (NORVASC) 10 MG tablet Take 1 tablet (10 mg total) by mouth daily. Patient taking differently: Take 5 mg by mouth daily.  09/04/15  Yes Patrecia Pour, NP  benztropine (COGENTIN) 1 MG tablet Take 1 mg by mouth at bedtime.   Yes [provider]  busPIRone (BUSPAR) 5 MG tablet Take 1 tablet (5 mg total) by mouth 3 (three) times daily. Patient taking differently: Take 10 mg by mouth 3 (three) times daily.  09/25/15  Yes Barton Dubois, MD  clonazePAM (KLONOPIN) 0.5 MG tablet Take 1 tablet (0.5 mg total) by mouth 2 (two) times daily as needed (anxiety). Patient taking differently: Take 0.5 mg by mouth daily as needed (anxiety).  09/25/15  Yes Barton Dubois, MD  divalproex (DEPAKOTE ER) 500 MG 24 hr tablet One in the morning, 2 tabs at night Patient taking differently: Take 500-1,000 mg by mouth See admin instructions. 553m in the morning and 10050min the evening 07/28/17  Yes YaMarcial PacasMD  glycopyrrolate (ROBINUL) 2 MG tablet Take 1 tablet (2 mg total) by mouth 3 (three) times daily. Patient taking differently: Take 1 mg by mouth 3 (three) times  daily.  09/04/15  Yes LoPatrecia PourNP  levETIRAcetam (KEPPRA) 500 MG tablet Take 1 tablet (500 mg total) by mouth 2 (two) times daily. 07/28/17  Yes YaMarcial PacasMD  lurasidone (LATUDA) 80 MG TABS tablet Take 80 mg by mouth daily with breakfast.   Yes [provider]  metoprolol tartrate (LOPRESSOR) 25 MG tablet Take 1 tablet (25 mg total) by mouth 2 (two) times daily. 09/25/15  Yes MaBarton DuboisMD  mirtazapine (REMERON) 15 MG tablet Take 1 tablet (15 mg total) by mouth at bedtime. 09/04/15  Yes Lord, JaAsa SaunasNP  ondansetron (ZOFRAN ODT) 4 MG disintegrating tablet Take 1 tablet (4 mg total) by mouth every 8 (eight) hours as needed for nausea. 06/01/17  Yes MiNoemi ChapelMD  oxybutynin (DITROPAN) 5 MG tablet Take 5 mg by mouth 2 (two) times daily.   Yes [provider]  QUEtiapine (SEROQUEL) 50 MG tablet Take 50 mg by mouth 2 (two) times daily.   Yes [provider]    Current Medications Scheduled Meds: . sodium chloride   Intravenous Once  . chlorhexidine gluconate (MEDLINE KIT)  15 mL Mouth Rinse BID  . hydrocortisone sod succinate (SOLU-CORTEF) inj  50 mg Intravenous Q6H  . mouth rinse  15 mL Mouth Rinse 10 times per day  . rocuronium  1 mg/kg Intravenous Once  . vancomycin variable dose per unstable renal function (pharmacist dosing)   Does not apply See admin instructions   Continuous Infusions: . sodium chloride Stopped (10/19/17 0312)  . sodium chloride Stopped (11/08/2017 1608)  . sodium chloride 10 mL/hr at 10/20/17 0910  . [START ON 10/21/2017] cefTRIAXone (ROCEPHIN)  IV    . famotidine (PEPCID) IV Stopped (10/19/17 1455)  . lacosamide (VIMPAT) IV Stopped (10/20/17 0941)  . levETIRAcetam Stopped (10/20/17 1037)  . norepinephrine (LEVOPHED) Adult infusion 30 mcg/min (10/20/17 1132)  . phenylephrine (NEO-SYNEPHRINE) Adult infusion    .  sodium bicarbonate  infusion 1000 mL 125 mL/hr at 10/20/17 1100  . valproate sodium Stopped (10/20/17 0911)  .  vasopressin (PITRESSIN) infusion - *FOR SHOCK* Stopped (10/20/17 0302)   PRN Meds:.sodium chloride, sodium chloride, sodium chloride, clonazePAM, fentaNYL (SUBLIMAZE) injection, fentaNYL (SUBLIMAZE) injection  CBC Recent Labs  Lab 10/29/2017 0847 10/19/17 0347 10/19/17 1604 10/19/17 2341 10/20/17 0610 10/20/17 0634  WBC 22.4* 10.5 18.3* 20.0*  --   --   NEUTROABS 16.3*  --  16.3*  --   --   --   HGB 14.3 9.6* 10.5* 9.9* 6.5* 7.6*  HCT 42.0 30.1* 32.2* 29.4* 19.0* 22.8*  MCV 93.5 98.4 95.5 95.5  --   --   PLT 119* 25* 21* 22*  --   --    Basic Metabolic Panel Recent Labs  Lab 10/29/2017 2213 10/19/17 0347 10/19/17 0753 10/19/17 1436 10/19/17 2120 10/20/17 0323 10/20/17 0610 10/20/17 0917  NA 142 143 143 143 141 143 145 143  K 3.2* 3.7 3.8 3.7 3.6 3.7 3.6 3.5  CL 117* 114* 113* 110 108 105  --  102  CO2  10* 12* 12* 14* 13* 13*  --  14*  GLUCOSE 127* 191* 114* 149* 117* 107*  --  99  BUN 34* 34* 35* 36* 38* 38*  --  39*  CREATININE 5.22* 4.91* 4.77* 4.83* 4.96* 5.26*  --  5.86*  CALCIUM 5.9* 5.9* 5.9* 5.7* 5.4* 5.9*  --  6.1*  PHOS 2.3* 3.0  --   --   --  5.6*  --   --     Physical Exam  Blood pressure (!) 96/54, pulse (!) 122, temperature (!) 97.5 F (36.4 C), resp. rate (!) 35, height 5' 11"  (1.803 m), weight 100.6 kg, SpO2 100 %. 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  Agree overall picture is unclear but most consistent with severe sepsis.  1. AoCKD4, nonoliguric, UOP dropping 2. Metabolic acidosis with increased anion gap, lactic acidosis 3. Shock, likely septic 4. Severe coagulopathy / DIC 5. Severe TCP, Anemia 6. Unresponsive 7. Severe hypoglycemia at  admission 8. VDRF 9. Rhabdomyolysis 10. Transaminitis 11. History of traumatic brain injury 2012, left-sided hemiparesis 12. Bipolar affective disorder 13. Seizure disorder, neurology following, continuous EEG 14. Group home resident  Plan 1. Initiate CRRT, all 4K, net even for UF, no heparin 2. Temp L Femoral HD cath placed CCM 8/8  Pearson Grippe MD [phone] pgr 10/20/2017, 12:50 PM

## 2017-10-20 NOTE — Progress Notes (Signed)
LTM EEG D/C'd per Dr Amada JupiterKirkpatrick. Pt was hooked up to EEG using Ten20 paste and paper tape, no collodion was used. When electrodes were removed there was slight bruising noted under F8, F4, Fz, F4, F7, Fp1 and Fp2. When the tape from Fp2 was pulled back the outer layer of skin peeled up with it leaving approximately 1.5cm sore. RN notified

## 2017-10-20 NOTE — Progress Notes (Signed)
Pharmacy Antibiotic Note  Elam Dutchravis Portner is a 42 y.o. male admitted on 10/15/2017 with sepsis.  Pharmacy has been consulted for Vancomycin dosing.  AKI (SCr 5.86) - Renal initiating CRRT today.  WBC up at 20, Tmax 99, Remains on pressors  Plan: Adjust Vancomycin to 10 mg/kg (1000mg ) IV every 24 hours - next dose 8/9 at 1600 PM. (~24hrs after CRRT initiation) Monitor CRRT toleration and renal plan Monitor clinical status and culture results.   Height: 5\' 11"  (180.3 cm) Weight: 221 lb 12.5 oz (100.6 kg) IBW/kg (Calculated) : 75.3  Temp (24hrs), Avg:97.7 F (36.5 C), Min:96.3 F (35.7 C), Max:99 F (37.2 C)  Recent Labs  Lab October 09, 2017 0847 October 09, 2017 1049  October 09, 2017 2215 10/19/17 0347 10/19/17 0753 10/19/17 0821 10/19/17 1436 10/19/17 1604 10/19/17 2120 10/19/17 2242 10/19/17 2341 10/20/17 0323 10/20/17 0501 10/20/17 0917  WBC 22.4*  --   --   --  10.5  --   --   --  18.3*  --   --  20.0*  --   --   --   CREATININE 5.80*  --    < >  --  4.91* 4.77*  --  4.83*  --  4.96*  --   --  5.26*  --  5.86*  LATICACIDVEN 9.3* 7.6*  --  8.6*  --   --  8.7*  --   --   --  11.1*  --   --  19.1*  --    < > = values in this interval not displayed.    Estimated Creatinine Clearance: 19.8 mL/min (A) (by C-G formula based on SCr of 5.86 mg/dL (H)).    No Known Allergies  Antimicrobials this admission: Vancomycin 8/6 >> Ceftriaxone 8/6 >> Zosyn 8/6 x1 dose Acyclovir 8/6 >>8/8  Microbiology results: 8/6blood x 2 - ngtd x2d 8/6 urine - 50K viridans streptococcus 8/6 tracheal aspirate - 8/6 CSF >> ngtd x2d 8/6 HSV PCR negative 8/6 Strep Pneumo UA - negative 8/7 GI Panel - negative 8/7 Cdiff - negative  Thank you for allowing pharmacy to be a part of this patient's care.  Link SnufferJessica Kirbi Farrugia, PharmD, BCPS, BCCCP Clinical Pharmacist Clinical phone 10/20/2017 until 3:[phone]- #[phone] Please refer to [facility] and [facility] for pharmacists numbers 10/20/2017 1:35 PM

## 2017-10-21 LAB — PREPARE FRESH FROZEN PLASMA

## 2017-10-21 LAB — RENAL FUNCTION PANEL
ALBUMIN: 2.5 g/dL — AB (ref 3.5–5.0)
Anion gap: 27 — ABNORMAL HIGH (ref 5–15)
BUN: 15 mg/dL (ref 6–20)
CO2: 16 mmol/L — ABNORMAL LOW (ref 22–32)
CREATININE: 4.02 mg/dL — AB (ref 0.61–1.24)
Calcium: 6.3 mg/dL — CL (ref 8.9–10.3)
Chloride: 97 mmol/L — ABNORMAL LOW (ref 98–111)
GFR calc Af Amer: 20 mL/min — ABNORMAL LOW (ref >60)
GFR calc non Af Amer: 17 mL/min — ABNORMAL LOW (ref >60)
GLUCOSE: 70 mg/dL (ref 70–99)
PHOSPHORUS: 5.9 mg/dL — AB (ref 2.5–4.6)
POTASSIUM: 4.1 mmol/L (ref 3.5–5.1)
SODIUM: 140 mmol/L (ref 135–145)

## 2017-10-21 LAB — GLUCOSE, CAPILLARY
GLUCOSE-CAPILLARY: 80 mg/dL (ref 70–99)
GLUCOSE-CAPILLARY: 81 mg/dL (ref 70–99)
GLUCOSE-CAPILLARY: 95 mg/dL (ref 70–99)
Glucose-Capillary: 107 mg/dL — ABNORMAL HIGH (ref 70–99)
Glucose-Capillary: 110 mg/dL — ABNORMAL HIGH (ref 70–99)
Glucose-Capillary: 147 mg/dL — ABNORMAL HIGH (ref 70–99)
Glucose-Capillary: 49 mg/dL — ABNORMAL LOW (ref 70–99)
Glucose-Capillary: 56 mg/dL — ABNORMAL LOW (ref 70–99)
Glucose-Capillary: 58 mg/dL — ABNORMAL LOW (ref 70–99)
Glucose-Capillary: 76 mg/dL (ref 70–99)

## 2017-10-21 LAB — PREPARE PLATELET PHERESIS: Unit division: 0

## 2017-10-21 LAB — CBC
HEMATOCRIT: 26.6 % — AB (ref 39.0–52.0)
Hemoglobin: 8.7 g/dL — ABNORMAL LOW (ref 13.0–17.0)
MCH: 31.2 pg (ref 26.0–34.0)
MCHC: 32.7 g/dL (ref 30.0–36.0)
MCV: 95.3 fL (ref 78.0–100.0)
Platelets: 25 10*3/uL — CL (ref 150–400)
RBC: 2.79 MIL/uL — ABNORMAL LOW (ref 4.22–5.81)
RDW: 16.3 % — AB (ref 11.5–15.5)
WBC: 18.1 10*3/uL — AB (ref 4.0–10.5)

## 2017-10-21 LAB — BPAM CRYOPRECIPITATE
BLOOD PRODUCT EXPIRATION DATE: 201908081533
ISSUE DATE / TIME: 201908081119
Unit Type and Rh: 5100

## 2017-10-21 LAB — PROTIME-INR
INR: 6.36 — AB
Prothrombin Time: 55.6 seconds — ABNORMAL HIGH (ref 11.4–15.2)

## 2017-10-21 LAB — COMPREHENSIVE METABOLIC PANEL
ALBUMIN: 2.7 g/dL — AB (ref 3.5–5.0)
ALT: 6594 U/L — ABNORMAL HIGH (ref 0–44)
ANION GAP: 28 — AB (ref 5–15)
AST: 6715 U/L — ABNORMAL HIGH (ref 15–41)
Alkaline Phosphatase: 216 U/L — ABNORMAL HIGH (ref 38–126)
BUN: 25 mg/dL — ABNORMAL HIGH (ref 6–20)
CHLORIDE: 102 mmol/L (ref 98–111)
CO2: 11 mmol/L — AB (ref 22–32)
Calcium: 6.5 mg/dL — ABNORMAL LOW (ref 8.9–10.3)
Creatinine, Ser: 4.7 mg/dL — ABNORMAL HIGH (ref 0.61–1.24)
GFR calc Af Amer: 16 mL/min — ABNORMAL LOW (ref >60)
GFR calc non Af Amer: 14 mL/min — ABNORMAL LOW (ref >60)
GLUCOSE: 85 mg/dL (ref 70–99)
POTASSIUM: 4.1 mmol/L (ref 3.5–5.1)
SODIUM: 141 mmol/L (ref 135–145)
Total Bilirubin: 6.6 mg/dL — ABNORMAL HIGH (ref 0.3–1.2)
Total Protein: 4.4 g/dL — ABNORMAL LOW (ref 6.5–8.1)

## 2017-10-21 LAB — PREPARE CRYOPRECIPITATE: Unit division: 0

## 2017-10-21 LAB — BPAM PLATELET PHERESIS
Blood Product Expiration Date: 201908082359
ISSUE DATE / TIME: 201908081005
UNIT TYPE AND RH: 600

## 2017-10-21 LAB — BPAM FFP
BLOOD PRODUCT EXPIRATION DATE: 201908090933
ISSUE DATE / TIME: 201908081119
UNIT TYPE AND RH: 5100

## 2017-10-21 LAB — TRIGLYCERIDES: TRIGLYCERIDES: 110 mg/dL (ref <150)

## 2017-10-21 LAB — CALCIUM, IONIZED: Calcium, Ionized, Serum: <3 mg/dL — ABNORMAL LOW (ref 4.5–5.6)

## 2017-10-21 LAB — VALPROIC ACID LEVEL: Valproic Acid Lvl: 98 ug/mL (ref 50.0–100.0)

## 2017-10-21 LAB — LACTIC ACID, PLASMA: Lactic Acid, Venous: 12.9 mmol/L (ref 0.5–1.9)

## 2017-10-21 LAB — MAGNESIUM: MAGNESIUM: 1.8 mg/dL (ref 1.7–2.4)

## 2017-10-21 MED ORDER — SODIUM CHLORIDE 0.9 % IV SOLN
2.0000 g | Freq: Once | INTRAVENOUS | Status: AC
  Administered 2017-10-21: 2 g via INTRAVENOUS
  Filled 2017-10-21: qty 20

## 2017-10-21 MED ORDER — VALPROATE SODIUM 500 MG/5ML IV SOLN
500.0000 mg | Freq: Two times a day (BID) | INTRAVENOUS | Status: DC
  Filled 2017-10-21: qty 5

## 2017-10-21 MED ORDER — LACTULOSE 10 GM/15ML PO SOLN
30.0000 g | Freq: Two times a day (BID) | ORAL | Status: DC
  Administered 2017-10-21 – 2017-10-22 (×3): 30 g
  Filled 2017-10-21 (×3): qty 45

## 2017-10-21 MED ORDER — DEXTROSE 50 % IV SOLN
25.0000 mL | Freq: Once | INTRAVENOUS | Status: AC
  Administered 2017-10-21: 25 mL via INTRAVENOUS

## 2017-10-21 MED ORDER — DEXTROSE 50 % IV SOLN
INTRAVENOUS | Status: AC
  Administered 2017-10-21: 17:00:00
  Filled 2017-10-21: qty 50

## 2017-10-21 MED ORDER — STERILE WATER FOR INJECTION IV SOLN
INTRAVENOUS | Status: DC
  Administered 2017-10-21 – 2017-10-22 (×7): via INTRAVENOUS_CENTRAL
  Filled 2017-10-21 (×11): qty 150

## 2017-10-21 MED ORDER — PIVOT 1.5 CAL PO LIQD
1000.0000 mL | ORAL | Status: DC
  Administered 2017-10-21: 1000 mL

## 2017-10-21 MED ORDER — VITAMIN K1 10 MG/ML IJ SOLN
10.0000 mg | Freq: Once | INTRAMUSCULAR | Status: AC
  Administered 2017-10-21: 10 mg via SUBCUTANEOUS
  Filled 2017-10-21: qty 1

## 2017-10-21 MED ORDER — STERILE WATER FOR INJECTION IV SOLN
INTRAVENOUS | Status: DC
  Administered 2017-10-21 – 2017-10-22 (×11): via INTRAVENOUS_CENTRAL
  Filled 2017-10-21 (×19): qty 150

## 2017-10-21 MED ORDER — DEXTROSE 50 % IV SOLN
INTRAVENOUS | Status: AC
  Filled 2017-10-21: qty 50

## 2017-10-21 MED ORDER — DEXTROSE-NACL 5-0.9 % IV SOLN
INTRAVENOUS | Status: DC
  Administered 2017-10-21 – 2017-10-22 (×3): via INTRAVENOUS
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
## 2017-10-21 NOTE — Progress Notes (Signed)
Nutrition Follow-up  INTERVENTION:   Trickle tube feeding via OG tube: - Pivot 1.5 @ 20 ml/hr  Once tolerance established and per MD, recommend increasing TF to goal rate of 60 ml/hr to provide 2160 kcal, 135 grams protein, and 1094 ml free water.  NUTRITION DIAGNOSIS:   Increased nutrient needs related to (sepsis) as evidenced by estimated needs.  Ongoing, being addressed via TF  GOAL:   Patient will meet greater than or equal to 90% of their needs  Progressing, will be met with TF at goal rate  MONITOR:   Weight trends, I & O's, Vent status, TF tolerance  REASON FOR ASSESSMENT:   Ventilator    ASSESSMENT:   Pt with PMH of TBI (2012) with gait and speech difficulty, mild lower extremity rigidity and spastic L upper extremity paralysis, bipolar dz, spastic L hemiparesis, schizophrenia, and sz disorder who lives in a group home admitted with sepsis with concern for CNS infection.   8/8 - CRRT initiated  Pt discussed with RN.  RD consulted for trickle TF. Due to increased protein needs related to CRRT initiation and the high fat content of Oxepa tube feeding formula, RD to change formula to Pivot 1.5 to better meet pt's needs when advanced to goal rate. Like Oxepa, Pivot 1.5 includes EPA, DHA, and elevated antioxidants. Discussed with MD who approved plan.  Oxepa infusing at 25 ml/hr at time of RD visit. This provides 900 kcal, 38 grams protein, and 474 ml free water.  Admission weight: 197 lb (89.5 kg) - RD to use this weight to estimate needs as pt is now +11.6 L since admission. Weight gain of 13.4 kg since admission likely related to positive fluid balance rather than true weight gain.  Patient is currently intubated on ventilator support. Per imaging report on 10/20/17, pt with OG tube in stomach. MV: 16.7 L/min Temp (24hrs), Avg:97.5 F (36.4 C), Min:96.6 F (35.9 C), Max:98.4 F (36.9 C)  Propofol: none  BP: 98/43 MAP: 57  Medications reviewed and include:  D5 @ 50 ml/hr (provides 204 kcal/day), 20 mg Pepcid daily, Oxepa @ 25 ml/hr, 10 mg vitamin K once, Levophed @ 37.5 ml/hr  Labs reviewed: CO2 11 (L), BUN 25 (H), creatinine 4.70 (H), elevated AST and ALT, INR 6.36 (H), phosphorus 7.6 (H), hemoglobin 9.2 (L), HCT 28.0 (L) CBG's: 107, 56, 110, 55, 110, 51, 85, 92 x 24 hours  UOP: 90 ml x 24 hours I/O's: +11.6 L since admission Stool output: 400 ml x 24 hours CRRT fluid output: 1414 ml x 24 hours  Diet Order:   Diet Order            Diet NPO time specified  Diet effective now              EDUCATION NEEDS:   No education needs have been identified at this time  Skin:  Skin Assessment: Skin Integrity Issues: Incisions: R and L knees Other: partial thickness wound to forehead due to EEG tape  Last BM:  liquid stool via rectal tube  Height:   Ht Readings from Last 1 Encounters:  11/09/2017 5' 11"  (1.803 m)    Weight:   Wt Readings from Last 1 Encounters:  10/21/17 102.9 kg  Admission weight: 197 lbs (89.5 kg)  Ideal Body Weight:  78.1 kg  BMI:  Body mass index is 31.64 kg/m.  BMI now falls in obese category. However, pt is +11.6 L since admission.  Estimated Nutritional Needs:   Kcal:  2215 kcal  Protein:  135-150 grams  Fluid:  > 2 L/day    Gaynell Face, MS, RD, LDN Pager: [phone] Weekend/After Hours: [phone]

## 2017-10-21 NOTE — Progress Notes (Signed)
Critical calcium 6.3 called to Dr Denese KillingsAgarwala, orders received.

## 2017-10-21 NOTE — Progress Notes (Signed)
Platelet count of 25 given to Dr Denese KillingsAgarwala.

## 2017-10-21 NOTE — Progress Notes (Addendum)
Glucose from art line 58. One amp D50 given. Will recheck .   Recheck 147.

## 2017-10-21 NOTE — Progress Notes (Signed)
Pharmacy Note - Antimicrobial Stewardship Update  Notified by microbiology lab this morning with correction of gram stain results. Previously reported as gram negative rods from 11/11/2017 culture. Corrected results are no organisms seen on gram stain, no growth to date on culture, and no organisms detected on BCID. Lab will continue to incubate culture out of abundance of caution to ensure no further growth.  Glen MarketMelissa Jaliya Johnson, PharmD PGY1 Pharmacy Resident Phone [phone] 10/21/2017     8:52 AM

## 2017-10-21 NOTE — Progress Notes (Signed)
Subjective: No seizures  Exam: Vitals:   10/21/17 1700 10/21/17 1800  BP: (!) 89/47 (!) 83/51  Pulse: (!) 109 (!) 110  Resp: (!) 28 (!) 28  Temp:    SpO2: 97% 97%   Gen: In bed, intubated Resp: ventilated Abd: soft, nt  Neuro: MS: Opens eyes partially to noxious sitmuli, does not follow commands CN:L pupil minimally larger than right, corneals intact Motor: flexion bilateral upper extremities, no movements in the lower Sensory:as above  Pertinent Labs: VP 98  Impression: 42 yo M found down with low glucose, sepsis. Possible seizure reported. The concern for status on arrival was not born out on connection to continuous EEG. Possibilities include hypoglycemic injury, diffuse anoxic injury, septic encephalopathy, hepatic encephalopathy.   Recommendations: 1) continue LEV 500mg  IBD(renally dosed) 2) continue vimpat 150mg  BID(max dose) 3) continue VPA 500mg  TID 4) VPA level again in the AM   Ritta SlotMcNeill Crist Kruszka, MD [facility] [phone]  If 7pm- 7am, please page neurology on call as listed in [facility]. 10/21/2017  6:12 PM

## 2017-10-21 NOTE — Progress Notes (Signed)
Lactic Acid of 12.9 called to ordering provider Dr Lawana Paiolodonado.

## 2017-10-21 NOTE — Progress Notes (Signed)
S: intubated and unresponsive O:BP (!) 100/54   Pulse (!) 110   Temp 97.8 F (36.6 C) (Axillary)   Resp (!) 29   Ht 5' 11"  (1.803 m)   Wt 102.9 kg   SpO2 100%   BMI 31.64 kg/m   Intake/Output Summary (Last 24 hours) at 10/21/2017 1327 Last data filed at 10/21/2017 1300 Gross per 24 hour  Intake 3636.16 ml  Output 2884 ml  Net 752.16 ml   Intake/Output: I/O last 3 completed shifts: In: 7132.9 [I.V.:4122.8; Blood:1176; NG/GT:175.8; IV Piggyback:1658.3] Out: 2229 [Urine:315; PZWCH:8527; Stool:500]  Intake/Output this shift:  Total I/O In: 928.5 [I.V.:533.5; NG/GT:200; IV Piggyback:195] Out: 7824 [Other:1040] Weight change: 2.3 kg Gen: critically ill-appearing AAM intubated and unresponsive CVS: tachy Resp: occ rhonchi Abd: hypoactive bowel sounds Ext: trace edema  Recent Labs  Lab 10/27/2017 0847 10/16/2017 2213 10/19/17 0347 10/19/17 0753 10/19/17 0820 10/19/17 1436 10/19/17 2120 10/20/17 0323 10/20/17 0610 10/20/17 0917 10/20/17 1625 10/21/17 0600  NA 144 142 143 143  --  143 141 143 145 143 145 141  K 2.8* 3.2* 3.7 3.8  --  3.7 3.6 3.7 3.6 3.5 3.8 4.1  CL 110 117* 114* 113*  --  110 108 105  --  102 102 102  CO2 9* 10* 12* 12*  --  14* 13* 13*  --  14* 14* 11*  GLUCOSE 137* 127* 191* 114*  --  149* 117* 107*  --  99 82 85  BUN 38* 34* 34* 35*  --  36* 38* 38*  --  39* 40* 25*  CREATININE 5.80* 5.22* 4.91* 4.77*  --  4.83* 4.96* 5.26*  --  5.86* 6.29* 4.70*  ALBUMIN 3.5  --  1.4*  --  1.6*  --   --   --   --   --  2.2* 2.7*  CALCIUM 9.2 5.9* 5.9* 5.9*  --  5.7* 5.4* 5.9*  --  6.1* 5.8* 6.5*  PHOS  --  2.3* 3.0  --   --   --   --  5.6*  --   --  7.6*  --   AST 360*  --  2,177*  --  3,066*  --   --   --   --   --   --  2,353*  ALT 188*  --  1,591*  --  2,551*  --   --   --   --   --   --  6,594*   Liver Function Tests: Recent Labs  Lab 10/19/17 0347 10/19/17 0820 10/20/17 1625 10/21/17 0600  AST 2,177* 3,066*  --  6,715*  ALT 1,591* 2,551*  --  6,594*   ALKPHOS 41 49  --  216*  BILITOT 1.2 1.3*  --  6.6*  PROT 3.6* 3.3*  --  4.4*  ALBUMIN 1.4* 1.6* 2.2* 2.7*   Recent Labs  Lab 10/17/2017 2213  LIPASE 38   Recent Labs  Lab 10/20/17 1505  AMMONIA 108*   CBC: Recent Labs  Lab 10/14/2017 0847 10/19/17 0347 10/19/17 1604 10/19/17 2341  10/20/17 0634 10/20/17 1937 10/21/17 0931  WBC 22.4* 10.5 18.3* 20.0*  --   --  18.1* 18.1*  NEUTROABS 16.3*  --  16.3*  --   --   --  17.2*  --   HGB 14.3 9.6* 10.5* 9.9*   < > 7.6* 9.2* 8.7*  HCT 42.0 30.1* 32.2* 29.4*   < > 22.8* 28.0* 26.6*  MCV 93.5 98.4 95.5 95.5  --   --  95.9 95.3  PLT 119* 25* 21* 22*  --   --  37* 25*   < > = values in this interval not displayed.   Cardiac Enzymes: Recent Labs  Lab 10/13/2017 0847 10/24/2017 0855 10/21/2017 2213 10/19/17 0347 10/19/17 2241 10/20/17 0323 10/20/17 1505  CKTOTAL  --  3,641*  --   --   --   --  37,093*  TROPONINI 2.67*  --  2.52* 2.14* 2.65* 2.50*  --    CBG: Recent Labs  Lab 10/21/17 0018 10/21/17 0422 10/21/17 0451 10/21/17 0741 10/21/17 1244  GLUCAP 110* 56* 107* 76 80    Iron Studies: No results for input(s): IRON, TIBC, TRANSFERRIN, FERRITIN in the last 72 hours. Studies/Results: Ct Abdomen Pelvis Wo Contrast  Result Date: 10/20/2017 CLINICAL DATA:  Abdominopelvic ischemia EXAM: CT ABDOMEN AND PELVIS WITHOUT CONTRAST TECHNIQUE: Multidetector CT imaging of the abdomen and pelvis was performed following the standard protocol without IV contrast. COMPARISON:  10/26/2010 CT FINDINGS: Lower chest: Bilateral pleural effusions, moderate in volume with bibasilar atelectasis. Heart size is top normal with trace pericardial effusion measuring up to 5 mm posterolaterally on the left. Gastric tube is seen within the distal esophagus extending into the gastric antrum. Hepatobiliary: Given limitations of a noncontrast study, no space-occupying mass of the liver or biliary dilatation is identified. Hyperdense appearance of the bile within  the gallbladder may represent enterohepatic recirculation of contrast. More focal hyperdensities along the dependent aspect may represent small stones within the gallbladder. No definite pericholecystic fluid. Pancreas: No ductal dilatation. No definite mass given limitations of a noncontrast study. Spleen: No splenomegaly. Adrenals/Urinary Tract: Normal bilateral adrenal glands. No nephrolithiasis nor obstructive uropathy. No hydroureteronephrosis. The urinary bladder is decompressed by Foley catheter. Stomach/Bowel: Fluid and fluid distention of the stomach with normal small bowel rotation. Nonobstructed small bowel. Liquid stool within the colon with mild transmural thickening of the descending and sigmoid colon suggested. Rectal tube is in place. Appendix is visualized and is within normal limits in caliber. Vascular/Lymphatic: Right femoral arterial line is noted extending into the right external iliac artery. Nonaneurysmal abdominal aorta. No lymphadenopathy. Reproductive: Normal size prostate. Other: Small amount free fluid overlies the liver and is seen adjacent to the spleen. There is mild edema along the paracolic gutters. Musculoskeletal: No acute or significant osseous findings. IMPRESSION: 1. Moderate bilateral pleural effusions with bibasilar compressive atelectasis. 2. Small layering foci within the gallbladder consistent with cholelithiasis. Hyperdense appearance of the bile may be secondary to enterohepatic recirculation of previously administered intravenous contrast. 3. Nonobstructed, nondistended bowel. Slight transmural thickening of the descending sigmoid colon with liquid stool in place. Findings may represent a mild colitis. 4. Small amount of ascites. Electronically Signed   By: Ashley Royalty M.D.   On: 10/20/2017 02:54   Ct Head Wo Contrast  Result Date: 10/20/2017 CLINICAL DATA:  42 y/o  M; unresponsive patient. EXAM: CT HEAD WITHOUT CONTRAST TECHNIQUE: Contiguous axial images were  obtained from the base of the skull through the vertex without intravenous contrast. COMPARISON:  10/20/2017 CT head. FINDINGS: Brain: No new acute infarction, hemorrhage, hydrocephalus, extra-axial collection or mass lesion/mass effect. Persistent poor gray-white differentiation, however given lack of interval change this is favored to be technical over hypoxic injury. MRI is more sensitive to differentiate. Stable diffuse volume loss of the brain including prominent volume loss in the posterior distribution, brainstem, and cerebellum which can be seen with cortical basal degeneration or chronic antiseizure medication. Vascular: No hyperdense vessel or unexpected calcification.  Skull: Normal. Negative for fracture or focal lesion. Sinuses/Orbits: Moderate diffuse paranasal sinus mucosal thickening with fluid levels trace right mastoid fluid levels likely related to intubation and nasoenteric tube. Normal aeration of the left mastoid air cells. Orbits are unremarkable. Other: Mild diffuse subcutaneous edema in the scalp. IMPRESSION: 1. No new acute intracranial process identified. 2. Persistent poor gray-white differentiation, however, given lack of interval change this is favored to be technical over hypoxic injury. If clinically indicated MRI is more sensitive to differentiate. 3. Stable advanced volume loss of the brain asymmetric in the posterior distribution, brainstem, and cerebellum. Electronically Signed   By: Kristine Garbe M.D.   On: 10/20/2017 14:57   Dg Chest Port 1 View  Result Date: 10/20/2017 CLINICAL DATA:  Respiratory failure EXAM: PORTABLE CHEST 1 VIEW COMPARISON:  10/22/2017 and prior exams FINDINGS: Cardiomediastinal silhouette is unchanged. This is a mildly low volume film. Endotracheal tube with tip 3 cm above the carina, NG tube entering the stomach and LEFT subclavian central venous catheter with tip overlying the LOWER SVC again noted. Mild bibasilar atelectasis again noted.  There has been little interval change since the prior study. IMPRESSION: Unchanged appearance of the chest.  Mild bibasilar atelectasis. Electronically Signed   By: Margarette Canada M.D.   On: 10/20/2017 11:34   . sodium chloride   Intravenous Once  . chlorhexidine gluconate (MEDLINE KIT)  15 mL Mouth Rinse BID  . dextrose      . hydrocortisone sod succinate (SOLU-CORTEF) inj  50 mg Intravenous Q6H  . lactulose  30 g Per Tube BID  . mouth rinse  15 mL Mouth Rinse 10 times per day    BMET    Component Value Date/Time   NA 141 10/21/2017 0600   K 4.1 10/21/2017 0600   CL 102 10/21/2017 0600   CO2 11 (L) 10/21/2017 0600   GLUCOSE 85 10/21/2017 0600   BUN 25 (H) 10/21/2017 0600   CREATININE 4.70 (H) 10/21/2017 0600   CALCIUM 6.5 (L) 10/21/2017 0600   GFRNONAA 14 (L) 10/21/2017 0600   GFRAA 16 (L) 10/21/2017 0600   CBC    Component Value Date/Time   WBC 18.1 (H) 10/21/2017 0931   RBC 2.79 (L) 10/21/2017 0931   HGB 8.7 (L) 10/21/2017 0931   HCT 26.6 (L) 10/21/2017 0931   PLT 25 (LL) 10/21/2017 0931   MCV 95.3 10/21/2017 0931   MCH 31.2 10/21/2017 0931   MCHC 32.7 10/21/2017 0931   RDW 16.3 (H) 10/21/2017 0931   LYMPHSABS 0.7 10/20/2017 1937   MONOABS 0.2 10/20/2017 1937   EOSABS 0.0 10/20/2017 1937   BASOSABS 0.0 10/20/2017 1937   Assessment 1M with AoCKD4 likely related to ATN from hemodynamic changes.  Agree overall picture is unclear but most consistent with severe sepsis.   Assessment/Plan:  1. AKI/CKD stage 4- Now oliguric in setting of shock/rhabdo.  CVVHD initiated 10/20/17.  Low bicarb despite CVVHD so will change replacement fluids to isotonic bicarb and follow 2. Lactic acidosis- as above 3. Vascular access- left femoral temp HD catheter 4. Shock with multiorgan failure- cont with pressors 5. Severe coagulopathy 6. Rhabdomyolysis 7. VDRF 8. 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 Bipolar disorder 10. Shock  liver 11. Thrombocytopenia 12. Anemia 13. Hypoglycemia 14. Disposition- poor overall prognosis.  Continue with current level of medical care, however recommend palliative care consult to help set goals/limits of care.   Donetta Potts, MD [employer] [phone]

## 2017-10-21 NOTE — Plan of Care (Signed)
Patient has been started on tube feedings.

## 2017-10-21 NOTE — Progress Notes (Addendum)
Hypoglycemic Event  CBG: 56  Treatment: 25 ml of d 50  Symptoms: none  Follow-up CBG: Time:0451 CBG Result:107  Possible Reasons for Event: tube feeding at low rate  Comments/MD notified:Molli KnockYacoub via elink, d5ns started     Glen Johnson, Judson Tsan R

## 2017-10-22 ENCOUNTER — Inpatient Hospital Stay (HOSPITAL_COMMUNITY): Payer: Medicaid Other

## 2017-10-22 LAB — COMPREHENSIVE METABOLIC PANEL
ALT: 4998 U/L — AB (ref 0–44)
AST: 3632 U/L — AB (ref 15–41)
Albumin: 2.2 g/dL — ABNORMAL LOW (ref 3.5–5.0)
Alkaline Phosphatase: 179 U/L — ABNORMAL HIGH (ref 38–126)
Anion gap: 23 — ABNORMAL HIGH (ref 5–15)
BUN: 11 mg/dL (ref 6–20)
CHLORIDE: 86 mmol/L — AB (ref 98–111)
CO2: 30 mmol/L (ref 22–32)
CREATININE: 3.88 mg/dL — AB (ref 0.61–1.24)
Calcium: 6 mg/dL — CL (ref 8.9–10.3)
GFR calc non Af Amer: 18 mL/min — ABNORMAL LOW (ref >60)
GFR, EST AFRICAN AMERICAN: 21 mL/min — AB (ref >60)
Glucose, Bld: 75 mg/dL (ref 70–99)
POTASSIUM: 3.9 mmol/L (ref 3.5–5.1)
SODIUM: 139 mmol/L (ref 135–145)
Total Bilirubin: 9.2 mg/dL — ABNORMAL HIGH (ref 0.3–1.2)
Total Protein: 3.5 g/dL — ABNORMAL LOW (ref 6.5–8.1)

## 2017-10-22 LAB — CSF CULTURE: CULTURE: NO GROWTH

## 2017-10-22 LAB — POCT I-STAT 3, ART BLOOD GAS (G3+)
ACID-BASE EXCESS: 6 mmol/L — AB (ref 0.0–2.0)
Bicarbonate: 26.8 mmol/L (ref 20.0–28.0)
O2 SAT: 97 %
TCO2: 28 mmol/L (ref 22–32)
pCO2 arterial: 24.8 mmHg — ABNORMAL LOW (ref 32.0–48.0)
pH, Arterial: 7.64 (ref 7.350–7.450)
pO2, Arterial: 69 mmHg — ABNORMAL LOW (ref 83.0–108.0)

## 2017-10-22 LAB — CBC
HEMATOCRIT: 25 % — AB (ref 39.0–52.0)
HEMATOCRIT: 29.8 % — AB (ref 39.0–52.0)
Hemoglobin: 8.3 g/dL — ABNORMAL LOW (ref 13.0–17.0)
Hemoglobin: 9.7 g/dL — ABNORMAL LOW (ref 13.0–17.0)
MCH: 30.6 pg (ref 26.0–34.0)
MCH: 30.8 pg (ref 26.0–34.0)
MCHC: 33.2 g/dL (ref 30.0–36.0)
MCHC: 32.6 g/dL (ref 30.0–36.0)
MCV: 92.3 fL (ref 78.0–100.0)
MCV: 94.6 fL (ref 78.0–100.0)
PLATELETS: 26 10*3/uL — AB (ref 150–400)
Platelets: 28 10*3/uL — CL (ref 150–400)
RBC: 2.71 MIL/uL — AB (ref 4.22–5.81)
RBC: 3.15 MIL/uL — AB (ref 4.22–5.81)
RDW: 15.9 % — ABNORMAL HIGH (ref 11.5–15.5)
RDW: 16.3 % — ABNORMAL HIGH (ref 11.5–15.5)
WBC: 11 10*3/uL — ABNORMAL HIGH (ref 4.0–10.5)
WBC: 14.3 10*3/uL — ABNORMAL HIGH (ref 4.0–10.5)

## 2017-10-22 LAB — BPAM FFP
BLOOD PRODUCT EXPIRATION DATE: 201908132359
Blood Product Expiration Date: 201908132359
ISSUE DATE / TIME: 201908082335
ISSUE DATE / TIME: 201908090124
Unit Type and Rh: 5100
Unit Type and Rh: 5100

## 2017-10-22 LAB — PREPARE FRESH FROZEN PLASMA
Unit division: 0
Unit division: 0

## 2017-10-22 LAB — RENAL FUNCTION PANEL
ALBUMIN: 2.6 g/dL — AB (ref 3.5–5.0)
ANION GAP: 29 — AB (ref 5–15)
BUN: 8 mg/dL (ref 6–20)
CO2: 24 mmol/L (ref 22–32)
Calcium: 7 mg/dL — ABNORMAL LOW (ref 8.9–10.3)
Chloride: 86 mmol/L — ABNORMAL LOW (ref 98–111)
Creatinine, Ser: 3.7 mg/dL — ABNORMAL HIGH (ref 0.61–1.24)
GFR, EST AFRICAN AMERICAN: 22 mL/min — AB (ref >60)
GFR, EST NON AFRICAN AMERICAN: 19 mL/min — AB (ref >60)
Glucose, Bld: 65 mg/dL — ABNORMAL LOW (ref 70–99)
PHOSPHORUS: 6.3 mg/dL — AB (ref 2.5–4.6)
POTASSIUM: 4.5 mmol/L (ref 3.5–5.1)
Sodium: 139 mmol/L (ref 135–145)

## 2017-10-22 LAB — BASIC METABOLIC PANEL
ANION GAP: 30 — AB (ref 5–15)
BUN: 9 mg/dL (ref 6–20)
CALCIUM: 7.4 mg/dL — AB (ref 8.9–10.3)
CO2: 24 mmol/L (ref 22–32)
Chloride: 86 mmol/L — ABNORMAL LOW (ref 98–111)
Creatinine, Ser: 3.79 mg/dL — ABNORMAL HIGH (ref 0.61–1.24)
GFR, EST AFRICAN AMERICAN: 21 mL/min — AB (ref >60)
GFR, EST NON AFRICAN AMERICAN: 18 mL/min — AB (ref >60)
GLUCOSE: 96 mg/dL (ref 70–99)
POTASSIUM: 3.9 mmol/L (ref 3.5–5.1)
Sodium: 140 mmol/L (ref 135–145)

## 2017-10-22 LAB — LACTIC ACID, PLASMA
LACTIC ACID, VENOUS: 17 mmol/L — AB (ref 0.5–1.9)
LACTIC ACID, VENOUS: 20.6 mmol/L — AB (ref 0.5–1.9)

## 2017-10-22 LAB — GLUCOSE, CAPILLARY
GLUCOSE-CAPILLARY: 71 mg/dL (ref 70–99)
GLUCOSE-CAPILLARY: 76 mg/dL (ref 70–99)
GLUCOSE-CAPILLARY: 133 mg/dL — AB (ref 70–99)
GLUCOSE-CAPILLARY: 78 mg/dL (ref 70–99)
GLUCOSE-CAPILLARY: 38 mg/dL — AB (ref 70–99)
Glucose-Capillary: 50 mg/dL — ABNORMAL LOW (ref 70–99)
Glucose-Capillary: 52 mg/dL — ABNORMAL LOW (ref 70–99)
Glucose-Capillary: 58 mg/dL — ABNORMAL LOW (ref 70–99)
Glucose-Capillary: 75 mg/dL (ref 70–99)

## 2017-10-22 LAB — PROTIME-INR
INR: >10
Prothrombin Time: >90 seconds — ABNORMAL HIGH (ref 11.4–15.2)
Prothrombin Time: >90 seconds — ABNORMAL HIGH (ref 11.4–15.2)

## 2017-10-22 LAB — MAGNESIUM: MAGNESIUM: 1.9 mg/dL (ref 1.7–2.4)

## 2017-10-22 LAB — VALPROIC ACID LEVEL: Valproic Acid Lvl: 46 ug/mL — ABNORMAL LOW (ref 50.0–100.0)

## 2017-10-22 LAB — CSF CULTURE W GRAM STAIN

## 2017-10-22 LAB — AMMONIA: AMMONIA: 115 umol/L — AB (ref 9–35)

## 2017-10-22 LAB — APTT: aPTT: 56 seconds — ABNORMAL HIGH (ref 24–36)

## 2017-10-22 LAB — ADAMTS13 ACTIVITY REFLEX

## 2017-10-22 LAB — ADAMTS13 ACTIVITY: ADAMTS 13 ACTIVITY: 37 % — AB (ref >66.8)

## 2017-10-22 MED ORDER — LACOSAMIDE 50 MG PO TABS: 150.0000 mg | ORAL_TABLET | Freq: Two times a day (BID) | ORAL | Status: DC

## 2017-10-22 MED ORDER — PRISMASOL BGK 4/2.5 32-4-2.5 MEQ/L IV SOLN
INTRAVENOUS | Status: DC
  Administered 2017-10-22 (×2): via INTRAVENOUS_CENTRAL

## 2017-10-22 MED ORDER — DEXTROSE 50 % IV SOLN
50.0000 mL | Freq: Once | INTRAVENOUS | Status: AC
  Administered 2017-10-22: 50 mL via INTRAVENOUS

## 2017-10-22 MED ORDER — LORAZEPAM 2 MG/ML IJ SOLN
2.0000 mg | Freq: Once | INTRAMUSCULAR | Status: AC
  Administered 2017-10-22: 2 mg via INTRAVENOUS

## 2017-10-22 MED ORDER — SODIUM CHLORIDE 0.9% IV SOLUTION: Freq: Once | INTRAVENOUS | Status: DC

## 2017-10-22 MED ORDER — LEVETIRACETAM 100 MG/ML PO SOLN
500.0000 mg | Freq: Two times a day (BID) | ORAL | Status: DC
  Administered 2017-10-22 (×2): 500 mg
  Filled 2017-10-22 (×2): qty 5

## 2017-10-22 MED ORDER — PIVOT 1.5 CAL PO LIQD
1000.0000 mL | ORAL | Status: DC
  Administered 2017-10-22 (×2): 1000 mL

## 2017-10-22 MED ORDER — ALBUMIN HUMAN 5 % IV SOLN: 12.5000 g | Freq: Once | INTRAVENOUS | Status: AC

## 2017-10-22 MED ORDER — ALBUMIN HUMAN 5 % IV SOLN
INTRAVENOUS | Status: AC
  Administered 2017-10-22: 15:00:00
  Filled 2017-10-22: qty 250

## 2017-10-22 MED ORDER — LACOSAMIDE 10 MG/ML PO SOLN: 150.0000 mg | Freq: Two times a day (BID) | ORAL | Status: DC

## 2017-10-22 MED ORDER — DEXTROSE 50 % IV SOLN
INTRAVENOUS | Status: AC
Start: 2017-10-22 — End: 2017-10-22
  Administered 2017-10-22: 50 mL via INTRAVENOUS
  Filled 2017-10-22: qty 50

## 2017-10-22 MED ORDER — LACOSAMIDE 10 MG/ML PO SOLN
150.0000 mg | Freq: Two times a day (BID) | ORAL | Status: DC
  Administered 2017-10-23: 150 mg
  Filled 2017-10-22: qty 15

## 2017-10-22 MED ORDER — LACTULOSE 10 GM/15ML PO SOLN
30.0000 g | Freq: Four times a day (QID) | ORAL | Status: DC
  Administered 2017-10-22 (×3): 30 g
  Filled 2017-10-22 (×2): qty 45

## 2017-10-22 MED ORDER — SODIUM CHLORIDE 0.9 % IV SOLN
2.0000 g | Freq: Once | INTRAVENOUS | Status: AC
  Administered 2017-10-22: 2 g via INTRAVENOUS
  Filled 2017-10-22: qty 20

## 2017-10-22 MED ORDER — VASOPRESSIN 20 UNIT/ML IV SOLN
0.0400 [IU]/min | INTRAVENOUS | Status: DC
  Administered 2017-10-22: 0.03 [IU]/min via INTRAVENOUS
  Filled 2017-10-22 (×2): qty 2

## 2017-10-22 MED ORDER — DEXTROSE 50 % IV SOLN
INTRAVENOUS | Status: AC
Start: 2017-10-22 — End: 2017-10-22
  Administered 2017-10-22: 13:00:00
  Filled 2017-10-22: qty 50

## 2017-10-22 MED ORDER — LORAZEPAM 2 MG/ML IJ SOLN
INTRAMUSCULAR | Status: AC
  Filled 2017-10-22: qty 1

## 2017-10-22 MED ORDER — DEXTROSE 5 % IV SOLN
10.0000 mg | Freq: Once | INTRAVENOUS | Status: AC
  Administered 2017-10-22: 10 mg via INTRAVENOUS
  Filled 2017-10-22: qty 1

## 2017-10-22 MED ORDER — PRISMASOL BGK 4/2.5 32-4-2.5 MEQ/L IV SOLN
INTRAVENOUS | Status: DC
  Administered 2017-10-22 – 2017-10-23 (×2): via INTRAVENOUS_CENTRAL

## 2017-10-22 NOTE — Progress Notes (Signed)
Hypoglycemic Event  CBG: 52  Treatment: D50  Symptoms: None  Follow-up CBG: Time: 2012 CBG Result:122  Possible Reasons for Event: Unknown     Tillman AbideNoah D Henery Betzold

## 2017-10-22 NOTE — Progress Notes (Signed)
S: no events overnight O:BP 92/76   Pulse (!) 104   Temp 97.6 F (36.4 C) (Axillary)   Resp (!) 28   Ht 5' 11"  (1.803 m)   Wt 102.7 kg   SpO2 98%   BMI 31.58 kg/m   Intake/Output Summary (Last 24 hours) at 10/22/2017 1144 Last data filed at 10/22/2017 1100 Gross per 24 hour  Intake 3050.88 ml  Output 2847 ml  Net 203.88 ml   Intake/Output: I/O last 3 completed shifts: In: 4876.5 [I.V.:2825.9; Blood:469; NG/GT:740.2; IV Piggyback:841.4] Out: 4844 [Urine:10; CHYIF:0277; AJOIN:8676]  Intake/Output this shift:  Total I/O In: 519.2 [I.V.:313.3; NG/GT:100; IV Piggyback:105.9] Out: 259 [Other:259] Weight change: -0.2 kg Gen: intubated and unresponsive HMC:[id number] Resp: scattered rhonchi Abd: +BS, soft Ext: tr edema  Recent Labs  Lab 11/06/2017 0847 10/17/2017 2213 10/19/17 0347  10/19/17 0820  10/19/17 2120 10/20/17 0323 10/20/17 0610 10/20/17 0917 10/20/17 1625 10/21/17 0600 10/21/17 1600 10/22/17 0500  NA 144 142 143   < >  --    < > 141 143 145 143 145 141 140 139  K 2.8* 3.2* 3.7   < >  --    < > 3.6 3.7 3.6 3.5 3.8 4.1 4.1 3.9  CL 110 117* 114*   < >  --    < > 108 105  --  102 102 102 97* 86*  CO2 9* 10* 12*   < >  --    < > 13* 13*  --  14* 14* 11* 16* 30  GLUCOSE 137* 127* 191*   < >  --    < > 117* 107*  --  99 82 85 70 75  BUN 38* 34* 34*   < >  --    < > 38* 38*  --  39* 40* 25* 15 11  CREATININE 5.80* 5.22* 4.91*   < >  --    < > 4.96* 5.26*  --  5.86* 6.29* 4.70* 4.02* 3.88*  ALBUMIN 3.5  --  1.4*  --  1.6*  --   --   --   --   --  2.2* 2.7* 2.5* 2.2*  CALCIUM 9.2 5.9* 5.9*   < >  --    < > 5.4* 5.9*  --  6.1* 5.8* 6.5* 6.3* 6.0*  PHOS  --  2.3* 3.0  --   --   --   --  5.6*  --   --  7.6*  --  5.9*  --   AST 360*  --  2,177*  --  3,066*  --   --   --   --   --   --  2,947*  --  3,632*  ALT 188*  --  1,591*  --  2,551*  --   --   --   --   --   --  6,594*  --  4,998*   < > = values in this interval not displayed.   Liver Function Tests: Recent Labs   Lab 10/19/17 0820  10/21/17 0600 10/21/17 1600 10/22/17 0500  AST 3,066*  --  6,715*  --  3,632*  ALT 2,551*  --  6,594*  --  4,998*  ALKPHOS 49  --  216*  --  179*  BILITOT 1.3*  --  6.6*  --  9.2*  PROT 3.3*  --  4.4*  --  3.5*  ALBUMIN 1.6*   < > 2.7* 2.5* 2.2*   < > = values in  this interval not displayed.   Recent Labs  Lab 10/26/2017 2213  LIPASE 38   Recent Labs  Lab 10/20/17 1505 10/22/17 0500  AMMONIA 108* 115*   CBC: Recent Labs  Lab 11/03/2017 0847  10/19/17 1604 10/19/17 2341  10/20/17 1937 10/21/17 0931 10/22/17 0500  WBC 22.4*   < > 18.3* 20.0*  --  18.1* 18.1* 11.0*  NEUTROABS 16.3*  --  16.3*  --   --  17.2*  --   --   HGB 14.3   < > 10.5* 9.9*   < > 9.2* 8.7* 8.3*  HCT 42.0   < > 32.2* 29.4*   < > 28.0* 26.6* 25.0*  MCV 93.5   < > 95.5 95.5  --  95.9 95.3 92.3  PLT 119*   < > 21* 22*  --  37* 25* 26*   < > = values in this interval not displayed.   Cardiac Enzymes: Recent Labs  Lab 10/21/2017 0847 10/16/2017 0855 10/15/2017 2213 10/19/17 0347 10/19/17 2241 10/20/17 0323 10/20/17 1505  CKTOTAL  --  3,641*  --   --   --   --  37,093*  TROPONINI 2.67*  --  2.52* 2.14* 2.65* 2.50*  --    CBG: Recent Labs  Lab 10/21/17 1712 10/21/17 1943 10/21/17 2351 10/22/17 0410 10/22/17 0753  GLUCAP 147* 95 81 71 76    Iron Studies: No results for input(s): IRON, TIBC, TRANSFERRIN, FERRITIN in the last 72 hours. Studies/Results: Ct Head Wo Contrast  Result Date: 10/20/2017 CLINICAL DATA:  42 y/o  M; unresponsive patient. EXAM: CT HEAD WITHOUT CONTRAST TECHNIQUE: Contiguous axial images were obtained from the base of the skull through the vertex without intravenous contrast. COMPARISON:  11/05/2017 CT head. FINDINGS: Brain: No new acute infarction, hemorrhage, hydrocephalus, extra-axial collection or mass lesion/mass effect. Persistent poor gray-white differentiation, however given lack of interval change this is favored to be technical over hypoxic injury.  MRI is more sensitive to differentiate. Stable diffuse volume loss of the brain including prominent volume loss in the posterior distribution, brainstem, and cerebellum which can be seen with cortical basal degeneration or chronic antiseizure medication. Vascular: No hyperdense vessel or unexpected calcification. Skull: Normal. Negative for fracture or focal lesion. Sinuses/Orbits: Moderate diffuse paranasal sinus mucosal thickening with fluid levels trace right mastoid fluid levels likely related to intubation and nasoenteric tube. Normal aeration of the left mastoid air cells. Orbits are unremarkable. Other: Mild diffuse subcutaneous edema in the scalp. IMPRESSION: 1. No new acute intracranial process identified. 2. Persistent poor gray-white differentiation, however, given lack of interval change this is favored to be technical over hypoxic injury. If clinically indicated MRI is more sensitive to differentiate. 3. Stable advanced volume loss of the brain asymmetric in the posterior distribution, brainstem, and cerebellum. Electronically Signed   By: Kristine Garbe M.D.   On: 10/20/2017 14:57   Dg Chest Port 1 View  Result Date: 10/22/2017 CLINICAL DATA:  Respiratory failure EXAM: PORTABLE CHEST 1 VIEW COMPARISON:  October 20, 2017 FINDINGS: The ETT and NG tube are in good position. A left central line is stable. No pneumothorax. The cardiomediastinal silhouette is stable. Possible small layering effusion on the left with underlying atelectasis. Mild bibasilar atelectasis. No focal infiltrate. IMPRESSION: 1. Support apparatus as above. 2. Probable tiny left pleural effusion.  Bibasilar atelectasis. Electronically Signed   By: Dorise Bullion III M.D   On: 10/22/2017 07:56   . sodium chloride   Intravenous Once  . chlorhexidine  gluconate (MEDLINE KIT)  15 mL Mouth Rinse BID  . hydrocortisone sod succinate (SOLU-CORTEF) inj  50 mg Intravenous Q6H  . lacosamide  150 mg Oral BID  . lactulose  30 g  Per Tube QID  . levETIRAcetam  500 mg Per Tube BID  . mouth rinse  15 mL Mouth Rinse 10 times per day    BMET    Component Value Date/Time   NA 139 10/22/2017 0500   K 3.9 10/22/2017 0500   CL 86 (L) 10/22/2017 0500   CO2 30 10/22/2017 0500   GLUCOSE 75 10/22/2017 0500   BUN 11 10/22/2017 0500   CREATININE 3.88 (H) 10/22/2017 0500   CALCIUM 6.0 (LL) 10/22/2017 0500   GFRNONAA 18 (L) 10/22/2017 0500   GFRAA 21 (L) 10/22/2017 0500   CBC    Component Value Date/Time   WBC 11.0 (H) 10/22/2017 0500   RBC 2.71 (L) 10/22/2017 0500   HGB 8.3 (L) 10/22/2017 0500   HCT 25.0 (L) 10/22/2017 0500   PLT 26 (LL) 10/22/2017 0500   MCV 92.3 10/22/2017 0500   MCH 30.6 10/22/2017 0500   MCHC 33.2 10/22/2017 0500   RDW 15.9 (H) 10/22/2017 0500   LYMPHSABS 0.7 10/20/2017 1937   MONOABS 0.2 10/20/2017 1937   EOSABS 0.0 10/20/2017 1937   BASOSABS 0.0 10/20/2017 1937     50M with AoCKD4 likely related to ATN from hemodynamic changes.Agree overall picture is unclear but most consistent with severe sepsis.   Assessment/Plan:  1. AKI/CKD stage 4- Now oliguric in setting of shock/rhabdo.  CVVHD initiated 10/20/17.  Low bicarb despite CVVHD so changed replacement fluids to isotonic bicarb but now CO2 elevated.  Will change back to 4K/2.5Ca prismasate and follow 2. Lactic acidosis- as above 3. Vascular access- left femoral temp HD catheter 4. Shock with multiorgan failure- cont with pressors 5. Severe coagulopathy 6. Rhabdomyolysis 7. VDRF 8. 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 Bipolar disorder 10. Shock liver 11. Thrombocytopenia 12. Anemia 13. Hypoglycemia 14. Disposition- poor overall prognosis.  Continue with current level of medical care, however recommend palliative care consult to help set goals/limits of care.   Donetta Potts, MD [employer] [phone]

## 2017-10-22 NOTE — Progress Notes (Signed)
INR>10. MD aware.

## 2017-10-22 NOTE — Progress Notes (Signed)
Dr Denese KillingsAgarwala notified of lactic acid level of 17.0 Family has been updated at bedside by Dr. Denese KillingsAgarwala.

## 2017-10-22 NOTE — Progress Notes (Signed)
Lactic acid of 20.6 called to e link.
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
## 2017-10-22 NOTE — Progress Notes (Signed)
Subjective: No seizures  Exam: Vitals:   10/22/17 1845 10/22/17 1900  BP: (!) 90/56 (!) 110/56  Pulse: (!) 117 (!) 118  Resp: (!) 29 (!) 30  Temp:    SpO2: 94% 94%   Gen: In bed, intubated Resp: ventilated Abd: soft, nt  Neuro: MS: Opens eyes partially to noxious sitmuli, does not follow commands CN:L pupil minimally larger than right, corneals intact Motor: flexion bilateral upper extremities, no movements in the lower Sensory:as above  Pertinent Labs:   Impression: 42 yo M found down with low glucose, sepsis. Possible seizure reported. The concern for status on arrival was not born out on connection to continuous EEG. Possibilities include hypoglycemic injury, diffuse anoxic injury, septic encephalopathy, hepatic encephalopathy.  Due to the potential of hepatic toxicity and lack of evidence of ongoing seizures, I have discontinued Depakote.  Recommendations: 1) continue LEV 500mg  IBD(renally dosed) 2) continue vimpat 150mg  BID(max dose)   Ritta SlotMcNeill Joangel Vanosdol, MD [facility] [phone]  If 7pm- 7am, please page neurology on call as listed in AMION. 10/22/2017  8:05 PM

## 2017-10-22 NOTE — Progress Notes (Signed)
Pt becoming hypotensive again, and tachypneic. Family called to come to bedside. Dr Denese KillingsAgarwala notified, he is bedside. Gtts and CRRT titrated to optimize BP.

## 2017-10-22 NOTE — Progress Notes (Signed)
Calcium 6 and INR >10 discussed with Dr Denese KillingsAgarwala.

## 2017-10-22 NOTE — Progress Notes (Signed)
Pt hypotensive despite increase in levo. Discuss with MD. Will add vaso and temporarily reduce Fluid Removal Rate to 0 until hemodynamically stable.

## 2017-10-22 NOTE — Progress Notes (Addendum)
Informed Dr. Denese KillingsAgarwala of MAP in the 40s by art line, verbal order received to increase Levophed gtt past max ordered rate of 50 mcg/kg/min to rate necessary to achieve MAP of 65.

## 2017-10-22 NOTE — Progress Notes (Signed)
Critical Values of Platlets and [facility]. No new orders.

## 2017-10-22 NOTE — Significant Event (Signed)
Critical Care Significant Event  Patient suddenly developed hypotension with MAP to 40's.  Bloody stools noted.  Repeat bedside echo showed increased size pericardial effusion with possible RV collapse but poor accoustic windows for tap.  BP improved significantly with blood products and increase in vasopressors.   Face to face conversation with mother and other family members. I explained to them how severe his condition was and that we are currently doing the maximum reasonable and that any further more invasive interventions would only cause more harm and not prevent his demise. The family understands and agrees that we shall not perform CPR if his heart were to stop.  An additional 30min of critical care time was performed.  Lynnell Catalanavi Robyn Nohr, MD [facility] Phys[facility]  Pager: [phone] Mobile: [phone] After hours: [phone].

## 2017-10-23 LAB — BPAM FFP
BLOOD PRODUCT EXPIRATION DATE: 201908112359
ISSUE DATE / TIME: 201908101410
Unit Type and Rh: 7300

## 2017-10-23 LAB — BPAM RBC
Blood Product Expiration Date: 201909082359
Blood Product Expiration Date: 201909082359
Blood Product Expiration Date: 201909082359
ISSUE DATE / TIME: 201908081517
UNIT TYPE AND RH: 5100
Unit Type and Rh: 5100
Unit Type and Rh: 5100

## 2017-10-23 LAB — CULTURE, BLOOD (ROUTINE X 2)
CULTURE: NO GROWTH
Culture: NO GROWTH
SETUP TIME: NONE SEEN
SPECIAL REQUESTS: ADEQUATE
Special Requests: ADEQUATE

## 2017-10-23 LAB — BPAM PLATELET PHERESIS
Blood Product Expiration Date: 201908122359
ISSUE DATE / TIME: 201908101409
UNIT TYPE AND RH: 6200

## 2017-10-23 LAB — TYPE AND SCREEN
ABO/RH(D): O POS
Antibody Screen: NEGATIVE
UNIT DIVISION: 0
Unit division: 0
Unit division: 0

## 2017-10-23 LAB — BPAM CRYOPRECIPITATE
Blood Product Expiration Date: 201908102004
ISSUE DATE / TIME: 201908101453
UNIT TYPE AND RH: 5100

## 2017-10-23 LAB — PREPARE PLATELET PHERESIS: UNIT DIVISION: 0

## 2017-10-23 LAB — PREPARE FRESH FROZEN PLASMA: UNIT DIVISION: 0

## 2017-10-23 LAB — PREPARE CRYOPRECIPITATE: Unit division: 0

## 2017-10-23 LAB — GLUCOSE, CAPILLARY
GLUCOSE-CAPILLARY: 115 mg/dL — AB (ref 70–99)
Glucose-Capillary: 122 mg/dL — ABNORMAL HIGH (ref 70–99)
Glucose-Capillary: 50 mg/dL — ABNORMAL LOW (ref 70–99)

## 2017-10-23 MED ORDER — DEXTROSE 50 % IV SOLN
INTRAVENOUS | Status: AC
  Administered 2017-10-23: 50 mL via INTRAVENOUS
  Filled 2017-10-23: qty 50

## 2017-10-23 MED ORDER — DEXTROSE 50 % IV SOLN
50.0000 mL | Freq: Once | INTRAVENOUS | Status: AC
  Administered 2017-10-23: 50 mL via INTRAVENOUS

## 2017-10-23 MED ORDER — FENTANYL 2500MCG IN NS 250ML (10MCG/ML) PREMIX INFUSION
0.0000 ug/h | INTRAVENOUS | Status: DC
  Administered 2017-10-23: 100 ug/h via INTRAVENOUS
  Filled 2017-10-23: qty 250

## 2017-10-26 LAB — CULTURE, RESPIRATORY W GRAM STAIN

## 2017-10-26 LAB — CULTURE, RESPIRATORY

## 2017-10-28 LAB — CULTURE, BLOOD (ROUTINE X 2)
SPECIAL REQUESTS: ADEQUATE
Special Requests: ADEQUATE

## 2017-11-13 NOTE — Progress Notes (Signed)
225 ml Fentanyl wasted with Susa RaringLisa RN

## 2017-11-13 NOTE — Death Summary Note (Signed)
DEATH SUMMARY   Patient Details  Name: Glen Johnson MRN: [id number] DOB: 08-08-1975  Admission/Discharge Information   Admit Date:  11-08-17  Date of Death: Date of Death: 11/13/2017  Time of Death: Time of Death: 0625  Length of Stay: 5  Referring Physician: Fleet Contras, MD   Reason(s) for Hospitalization  Shock and status epilepticus  Diagnoses  Preliminary cause of death:  Secondary Diagnoses (including complications and co-morbidities):  Active Problems:   Acute respiratory failure with hypoxia (HCC)   Sepsis (HCC)   Encounter for central line care   Septic shock [facility])   Brief Hospital Course (including significant findings, care, treatment, and services provided and events leading to death)  Glen Johnson is a 42 y.o. year old male who was in a group home following a traumatic brain injury.  He was brought to an outside hospital with hypotension and possible seizure activity.  He was found unresponsive after indeterminate out of time.  He was intubated started on vasopressors and given fluids and transferred to [facility].  EEG here showed no further seizure activity but diffuse slowing consistent with encephalopathy.  He was loaded with anticonvulsants and placed on continuous EEG monitoring.  This was discontinued after 48 hours as he had no further seizure activity noted.  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  It is thought that it suffered a seizure and was down for some period of time before being found.  He remained in profound distributive shock.  All cultures remained negative including respiratory blood and CSF cultures.  It is likely that the shock state was as a result of prolonged tissue hypoxia and hypotension.  He remained in vasopressor dependent shock, and while we initially were able to wean off vasopressin, it was eventually restarted for worsening hypotension.  Echocardiogram showed a  borderline pericardial effusion which was deemed not to be causing tamponade.  He developed acute kidney injury requiring CRRT but was not able to tolerate fluid removal due to hypotension.  He developed shock liver with severe coagulopathy also consistent with DIC.  Given his ongoing worsening I had a long conversation with his family members and press upon the severity of his illness.  This point I made it clear to them that meaningful recovery was unlikely.  They have expressed a desire to be comfortable.  At this point was decided that should not receive CPR or ACLS where his condition to deteriorate.  He is clinically deteriorated overnight and passed away on Nov 13, 2017.  No autopsy was performed.   Pertinent Labs and Studies  Significant Diagnostic Studies Ct Abdomen Pelvis Wo Contrast  Result Date: 10/20/2017 CLINICAL DATA:  Abdominopelvic ischemia EXAM: CT ABDOMEN AND PELVIS WITHOUT CONTRAST TECHNIQUE: Multidetector CT imaging of the abdomen and pelvis was performed following the standard protocol without IV contrast. COMPARISON:  10/26/2010 CT FINDINGS: Lower chest: Bilateral pleural effusions, moderate in volume with bibasilar atelectasis. Heart size is top normal with trace pericardial effusion measuring up to 5 mm posterolaterally on the left. Gastric tube is seen within the distal esophagus extending into the gastric antrum. Hepatobiliary: Given limitations of a noncontrast study, no space-occupying mass of the liver or biliary dilatation is identified. Hyperdense appearance of the bile within the gallbladder may represent enterohepatic recirculation of contrast. More focal hyperdensities along the dependent aspect may represent small stones within the gallbladder. No definite pericholecystic fluid. Pancreas: No ductal dilatation. No definite mass given limitations of a noncontrast  study. Spleen: No splenomegaly. Adrenals/Urinary Tract: Normal bilateral adrenal glands. No nephrolithiasis  nor obstructive uropathy. No hydroureteronephrosis. The urinary bladder is decompressed by Foley catheter. Stomach/Bowel: Fluid and fluid distention of the stomach with normal small bowel rotation. Nonobstructed small bowel. Liquid stool within the colon with mild transmural thickening of the descending and sigmoid colon suggested. Rectal tube is in place. Appendix is visualized and is within normal limits in caliber. Vascular/Lymphatic: Right femoral arterial line is noted extending into the right external iliac artery. Nonaneurysmal abdominal aorta. No lymphadenopathy. Reproductive: Normal size prostate. Other: Small amount free fluid overlies the liver and is seen adjacent to the spleen. There is mild edema along the paracolic gutters. Musculoskeletal: No acute or significant osseous findings. IMPRESSION: 1. Moderate bilateral pleural effusions with bibasilar compressive atelectasis. 2. Small layering foci within the gallbladder consistent with cholelithiasis. Hyperdense appearance of the bile may be secondary to enterohepatic recirculation of previously administered intravenous contrast. 3. Nonobstructed, nondistended bowel. Slight transmural thickening of the descending sigmoid colon with liquid stool in place. Findings may represent a mild colitis. 4. Small amount of ascites. Electronically Signed   By: Tollie Eth M.D.   On: 10/20/2017 02:54   Ct Head Wo Contrast  Result Date: 10/20/2017 CLINICAL DATA:  42 y/o  M; unresponsive patient. EXAM: CT HEAD WITHOUT CONTRAST TECHNIQUE: Contiguous axial images were obtained from the base of the skull through the vertex without intravenous contrast. COMPARISON:  10/27/2017 CT head. FINDINGS: Brain: No new acute infarction, hemorrhage, hydrocephalus, extra-axial collection or mass lesion/mass effect. Persistent poor gray-white differentiation, however given lack of interval change this is favored to be technical over hypoxic injury. MRI is more sensitive to  differentiate. Stable diffuse volume loss of the brain including prominent volume loss in the posterior distribution, brainstem, and cerebellum which can be seen with cortical basal degeneration or chronic antiseizure medication. Vascular: No hyperdense vessel or unexpected calcification. Skull: Normal. Negative for fracture or focal lesion. Sinuses/Orbits: Moderate diffuse paranasal sinus mucosal thickening with fluid levels trace right mastoid fluid levels likely related to intubation and nasoenteric tube. Normal aeration of the left mastoid air cells. Orbits are unremarkable. Other: Mild diffuse subcutaneous edema in the scalp. IMPRESSION: 1. No new acute intracranial process identified. 2. Persistent poor gray-white differentiation, however, given lack of interval change this is favored to be technical over hypoxic injury. If clinically indicated MRI is more sensitive to differentiate. 3. Stable advanced volume loss of the brain asymmetric in the posterior distribution, brainstem, and cerebellum. Electronically Signed   By: Mitzi Hansen M.D.   On: 10/20/2017 14:57   Ct Head Wo Contrast  Result Date: 10/29/2017 CLINICAL DATA:  New onset seizures. History of traumatic brain injury, LEFT hemiparesis, hypertension and seizures. EXAM: CT HEAD WITHOUT CONTRAST TECHNIQUE: Contiguous axial images were obtained from the base of the skull through the vertex without intravenous contrast. COMPARISON:  CT HEAD September 13, 2015 and MRI of the head January 23, 2015. FINDINGS: BRAIN: No intraparenchymal hemorrhage, mass effect nor midline shift. Moderate parenchymal brain volume loss, advanced within parieto-occipital lobes and cerebellum. No hydrocephalus. Mild blurring of the gray-white matter differentiation. No acute large vascular territory infarct. Basal cisterns patent. VASCULAR: Unremarkable. SKULL/SOFT TISSUES: No skull fracture. No significant soft tissue swelling. EEG leads in place resulting in streak  artifact. ORBITS/SINUSES: The included ocular globes and orbital contents are normal.Paranasal sinus mucosal thickening, RIGHT nasogastric tube. Mastoid air cells are well aerated. OTHER: None. IMPRESSION: 1. Poorly differentiated gray-white matter differentiation seen  with hypoxic ischemic injury or, potentially artifact from EEG leads. 2. Advanced parenchymal brain volume loss disproportionately affecting parietoccipital lobes and cerebellum is non-specific though can be secondary to corticobasal degeneration or long-term anti seizure medication. Recommend MRI of the brain on non emergent basis. 3. These results will be called to the ordering clinician or representative by the Radiologist Assistant, and communication documented in the PACS or zVision Dashboard. Electronically Signed   By: Awilda Metroourtnay  Bloomer M.D.   On: 11/03/2017 17:44   [place] Renal  Result Date: 10/19/2017 CLINICAL DATA:  Renal failure.  Hypertension. EXAM: RENAL / URINARY TRACT ULTRASOUND COMPLETE COMPARISON:  CT abdomen 10/26/2010 FINDINGS: Right Kidney: Length: 10.3 cm. Echogenicity within normal limits. No mass or hydronephrosis visualized. Left Kidney: Length: 9.9 cm. Echogenicity within normal limits. No mass or hydronephrosis visualized. Bladder: Not visualized, presumed collapsed. Other: Small amount of perihepatic fluid is shown on image 3. IMPRESSION: 1. No specific sonographic abnormality of the kidneys is identified. 2. Trace perihepatic ascites. 3. Nonvisualization of the urinary bladder, presumably due to empty bladder. Electronically Signed   By: Gaylyn RongWalter  Liebkemann M.D.   On: 10/19/2017 10:41   Dg Chest Port 1 View  Result Date: 10/22/2017 CLINICAL DATA:  Respiratory failure EXAM: PORTABLE CHEST 1 VIEW COMPARISON:  October 20, 2017 FINDINGS: The ETT and NG tube are in good position. A left central line is stable. No pneumothorax. The cardiomediastinal silhouette is stable. Possible small layering effusion on the left with  underlying atelectasis. Mild bibasilar atelectasis. No focal infiltrate. IMPRESSION: 1. Support apparatus as above. 2. Probable tiny left pleural effusion.  Bibasilar atelectasis. Electronically Signed   By: Gerome Samavid  Williams III M.D   On: 10/22/2017 07:56   Dg Chest Port 1 View  Result Date: 10/20/2017 CLINICAL DATA:  Respiratory failure EXAM: PORTABLE CHEST 1 VIEW COMPARISON:  10/17/2017 and prior exams FINDINGS: Cardiomediastinal silhouette is unchanged. This is a mildly low volume film. Endotracheal tube with tip 3 cm above the carina, NG tube entering the stomach and LEFT subclavian central venous catheter with tip overlying the LOWER SVC again noted. Mild bibasilar atelectasis again noted. There has been little interval change since the prior study. IMPRESSION: Unchanged appearance of the chest.  Mild bibasilar atelectasis. Electronically Signed   By: Harmon PierJeffrey  Hu M.D.   On: 10/20/2017 11:34   Dg Chest Port 1 View  Result Date: 10/20/2017 CLINICAL DATA:  Encounter for central line care EXAM: PORTABLE CHEST 1 VIEW COMPARISON:  10/24/2017 FINDINGS: Left subclavian line with tip at the upper cavoatrial junction. No pneumothorax. Endotracheal tube tip is in good position just below the clavicular heads. An orogastric tube reaches the Peri pyloric region. Low volume chest without focal opacity. Normal heart size. IMPRESSION: The new subclavian line is in good position.  No pneumothorax. Electronically Signed   By: Marnee SpringJonathon  Watts M.D.   On: 11/01/2017 15:57   Dg Chest Port 1 View  Result Date: 11/02/2017 CLINICAL DATA:  Fevers EXAM: PORTABLE CHEST 1 VIEW COMPARISON:  09/13/2015 FINDINGS: The heart size and mediastinal contours are within normal limits. Both lungs are clear. The visualized skeletal structures are unremarkable. IMPRESSION: No active disease. Electronically Signed   By: Alcide CleverMark  Lukens M.D.   On: 11/02/2017 09:08   Dg Chest Port 1v Same Day  Result Date: 10/13/2017 CLINICAL DATA:  Intubated.  EXAM: PORTABLE CHEST 1 VIEW COMPARISON:  Chest radiograph 10/31/2017 FINDINGS: Endotracheal tube in good position approximately 4 cm from carina. NG tube extends the stomach. Lungs are  clear. No acute osseous abnormality. IMPRESSION: Endotracheal tube and NG tube in good position Electronically Signed   By: Genevive BiStewart  Edmunds M.D.   On: 11/04/2017 10:13    Microbiology Recent Results (from the past 240 hour(s))  Blood Culture (routine x 2)     Status: None   Collection Time: 10/19/2017  8:47 AM  Result Value Ref Range Status   Specimen Description   Final    BLOOD RIGHT ARM Performed at [facility], [address] [place], [place] [address]    Special Requests   Final    BOTTLES DRAWN AEROBIC AND ANAEROBIC Blood Culture adequate volume Performed at [facility], [address] [place], [place] [address]    Culture  Setup Time   Final    CORRECTED RESULTS NO ORGANISMS SEEN PREVIOUSLY REPORTED AS: GRAM NEGATIVE RODS CRITICAL RESULT CALLED TO, READ BACK BY AND VERIFIED WITH: Glendon AxeZANNIE WITHERSPOON 10/20/17 @ 1250 LS CORRECTED RESULTS CALLED TO: PHARMD M LORE [phone] MLM    Culture   Final    NO GROWTH 5 DAYS Performed at [facility], [address] [place], [place] [address]    Report Status 2018/03/08 FINAL  Final  Blood Culture (routine x 2)     Status: None   Collection Time: 11/01/2017  8:55 AM  Result Value Ref Range Status   Specimen Description BLOOD LEFT HAND  Final   Special Requests   Final    BOTTLES DRAWN AEROBIC ONLY Blood Culture adequate volume   Culture   Final    NO GROWTH 5 DAYS Performed at [facility], [address] [place], [place] [address]    Report Status 2018/03/08 FINAL  Final  Urine culture     Status: Abnormal   Collection Time: 11/05/2017  9:00 AM  Result Value Ref Range Status   Specimen Description   Final    URINE, RANDOM Performed at [facility], [address] [place], [place] [address]    Special Requests   Final    NONE Performed at  [facility], [address] [place], [place] [address]    Culture 50,000 COLONIES/mL VIRIDANS STREPTOCOCCUS (A)  Final   Report Status 10/19/2017 FINAL  Final  CSF culture     Status: None   Collection Time: 10/14/2017  7:11 PM  Result Value Ref Range Status   Specimen Description CSF  Final   Special Requests NONE  Final   Gram Stain   Final    WBC PRESENT, PREDOMINANTLY MONONUCLEAR NO ORGANISMS SEEN CYTOSPIN SMEAR    Culture   Final    NO GROWTH 3 DAYS Performed at [facility], [address] [place], [place] [address]    Report Status 10/22/2017 FINAL  Final  MRSA PCR Screening     Status: None   Collection Time: 10/19/17 11:43 AM  Result Value Ref Range Status   MRSA by PCR NEGATIVE NEGATIVE Final    Comment:        The GeneXpert MRSA Assay (FDA approved for NASAL specimens only), is one component of a comprehensive MRSA colonization surveillance program. It is not intended to diagnose MRSA infection nor to guide or monitor treatment for MRSA infections. Performed at [facility], [address] [place], [place] [address]   Gastrointestinal Panel by PCR , Stool     Status: None   Collection Time: 10/19/17 12:03 PM  Result Value Ref Range Status   Campylobacter species NOT DETECTED NOT DETECTED  Final   Plesimonas shigelloides NOT DETECTED NOT DETECTED Final   Salmonella species NOT DETECTED NOT DETECTED Final   Yersinia enterocolitica NOT DETECTED NOT DETECTED Final   Vibrio species NOT DETECTED NOT DETECTED Final   Vibrio cholerae NOT DETECTED NOT DETECTED Final   Enteroaggregative E coli (EAEC) NOT DETECTED NOT DETECTED Final   Enteropathogenic E coli (EPEC) NOT DETECTED NOT DETECTED Final   Enterotoxigenic E coli (ETEC) NOT DETECTED NOT DETECTED Final   Shiga like toxin producing E coli (STEC) NOT DETECTED NOT DETECTED Final   Shigella/Enteroinvasive E coli (EIEC) NOT DETECTED NOT DETECTED Final   Cryptosporidium NOT DETECTED NOT DETECTED Final    Cyclospora cayetanensis NOT DETECTED NOT DETECTED Final   Entamoeba histolytica NOT DETECTED NOT DETECTED Final   Giardia lamblia NOT DETECTED NOT DETECTED Final   Adenovirus F40/41 NOT DETECTED NOT DETECTED Final   Astrovirus NOT DETECTED NOT DETECTED Final   Norovirus GI/GII NOT DETECTED NOT DETECTED Final   Rotavirus A NOT DETECTED NOT DETECTED Final   Sapovirus (I, II, IV, and V) NOT DETECTED NOT DETECTED Final    Comment: Performed at [facility], [address] [place], [place] [address]  C difficile quick scan w PCR reflex     Status: None   Collection Time: 10/19/17 12:06 PM  Result Value Ref Range Status   C Diff antigen NEGATIVE NEGATIVE Final   C Diff toxin NEGATIVE NEGATIVE Final   C Diff interpretation No C. difficile detected.  Final    Comment: Performed at [facility], [address], [place], [place] [address]  Culture, blood (routine x 2)     Status: None (Preliminary result)   Collection Time: 10/22/17 11:09 AM  Result Value Ref Range Status   Specimen Description BLOOD A-LINE  Final   Special Requests   Final    BOTTLES DRAWN AEROBIC AND ANAEROBIC Blood Culture adequate volume   Culture  Setup Time   Final    YEAST AEROBIC BOTTLE ONLY PATIENT DISCHARGED OR EXPIRED PATIENT DECEASED    Culture   Final    YEAST CULTURE REINCUBATED FOR BETTER GROWTH Performed at [facility], [address], [place], [place] [address]    Report Status PENDING  Incomplete  Culture, blood (routine x 2)     Status: None (Preliminary result)   Collection Time: 10/22/17 11:12 AM  Result Value Ref Range Status   Specimen Description BLOOD CENTRAL LINE  Final   Special Requests   Final    BOTTLES DRAWN AEROBIC AND ANAEROBIC Blood Culture adequate volume   Culture  Setup Time   Final    YEAST IN BOTH AEROBIC AND ANAEROBIC BOTTLES PATIENT DISCHARGED OR EXPIRED PATIENT DECEASED    Culture   Final    YEAST CULTURE REINCUBATED FOR BETTER GROWTH Performed at  [facility], [address] [place], [place] [address]    Report Status PENDING  Incomplete  Culture, respiratory (non-expectorated)     Status: None   Collection Time: 10/22/17  5:56 PM  Result Value Ref Range Status   Specimen Description TRACHEAL ASPIRATE  Final   Special Requests NONE  Final   Gram Stain   Final    RARE WBC PRESENT, PREDOMINANTLY MONONUCLEAR NO SQUAMOUS EPITHELIAL CELLS PRESENT FEW YEAST Performed at [facility], [address], [place], [place] [address]    Culture FEW CANDIDA LUSITANIAE  Final   Report Status 10/26/2017 FINAL  Final  Lab Basic Metabolic Panel: Recent Labs  Lab 10/20/17 0323  10/20/17 1625 10/21/17 0600 10/21/17 1600 10/22/17 0500 10/22/17 1416 10/22/17 1756  NA 143   < > 145 141 140 139 140 139  K 3.7   < > 3.8 4.1 4.1 3.9 3.9 4.5  CL 105   < > 102 102 97* 86* 86* 86*  CO2 13*   < > 14* 11* 16* 30 24 24   GLUCOSE 107*   < > 82 85 70 75 96 65*  BUN 38*   < > 40* 25* 15 11 9 8   CREATININE 5.26*   < > 6.29* 4.70* 4.02* 3.88* 3.79* 3.70*  CALCIUM 5.9*   < > 5.8* 6.5* 6.3* 6.0* 7.4* 7.0*  MG 1.3*  --   --  1.8  --  1.9  --   --   PHOS 5.6*  --  7.6*  --  5.9*  --   --  6.3*   < > = values in this interval not displayed.   Liver Function Tests: Recent Labs  Lab 10/20/17 1625 10/21/17 0600 10/21/17 1600 10/22/17 0500 10/22/17 1756  AST  --  1,610*  --  3,632*  --   ALT  --  6,594*  --  4,998*  --   ALKPHOS  --  216*  --  179*  --   BILITOT  --  6.6*  --  9.2*  --   PROT  --  4.4*  --  3.5*  --   ALBUMIN 2.2* 2.7* 2.5* 2.2* 2.6*   No results for input(s): LIPASE, AMYLASE in the last 168 hours. Recent Labs  Lab 10/20/17 1505 10/22/17 0500  AMMONIA 108* 115*   CBC: Recent Labs  Lab 10/19/17 2341  10/20/17 0634 10/20/17 1937 10/21/17 0931 10/22/17 0500 10/22/17 1416  WBC 20.0*  --   --  18.1* 18.1* 11.0* 14.3*  NEUTROABS  --   --   --  17.2*  --   --   --   HGB 9.9*   < > 7.6* 9.2* 8.7* 8.3* 9.7*   HCT 29.4*   < > 22.8* 28.0* 26.6* 25.0* 29.8*  MCV 95.5  --   --  95.9 95.3 92.3 94.6  PLT 22*  --   --  37* 25* 26* 28*   < > = values in this interval not displayed.   Cardiac Enzymes: Recent Labs  Lab 10/19/17 2241 10/20/17 0323 10/20/17 1505  CKTOTAL  --   --  37,093*  TROPONINI 2.65* 2.50*  --    Sepsis Labs: Recent Labs  Lab 10/20/17 0323 10/20/17 0501 10/20/17 1937 10/21/17 0931 10/21/17 1129 10/22/17 0500 10/22/17 1415 10/22/17 1416 10/22/17 1756  PROCALCITON 5.86  --   --   --   --   --   --   --   --   WBC  --   --  18.1* 18.1*  --  11.0*  --  14.3*  --   LATICACIDVEN  --  19.1*  --   --  12.9*  --  17.0*  --  20.6*    Procedures/Operations  Mechanical ventilation.  CRRT.  Srikar Chiang 10/26/2017, 6:12 PM

## 2017-11-13 NOTE — Progress Notes (Signed)
Page to Spiritual Care Acknowledged. Chaplain is grateful for the care to the family and medical briefings from the patient's attending nurse Anette RiedelNoah.   As I understand Patient is actively dying.   Present in the room were patient's two daughters (2) and the male spouse of the eldest daughter (1) and the patient's mother.  Youngest child held patient's hand consistently and was tearful.   There is a heaviness in the room. My sense is that grief is complicated by some pain that invested loved ones are feeling from decisions the patient made during his life. Sadness aggravated by understandable frustration, resentment and the wish that many things could have been different.   Chaplain extended deep empathic listening.  Ministry of Presence, Encouragement, and with family's consent offered connective scripture and prayer.   I will make the Chaplain coming on at 0830 aware and will circle back to the room before then if possible and definitely will return if paged before 0830.   - Chaplain Naythen Heikkila l. Stina Gane

## 2017-11-13 NOTE — Progress Notes (Signed)
Hypoglycemic Event  CBG: 50  Treatment: D50  Symptoms: None  Follow-up CBG: Time:439 CBG Result:115  Possible Reasons for Event: Unknown      Tillman AbideNoah D Puja Caffey

## 2017-11-13 NOTE — Progress Notes (Signed)
TOD [id number]. Family at bedside. ELINK aware of situation. Chaplain returned to bedside.

## 2017-11-13 NOTE — Progress Notes (Signed)
[facility] current vitals and family wishes for comfort without w/d care. Order for fentanyl gtt.

## 2017-11-13 DEATH — deceased

## 2017-12-01 ENCOUNTER — Telehealth: Payer: Self-pay | Admitting: *Deleted

## 2017-12-01 NOTE — Telephone Encounter (Signed)
[facility] to be signed by DR. Argawala. D/C forwarded to location of provider to be signed.

## 2017-12-05 NOTE — Telephone Encounter (Signed)
Received signed death certificate back from Dr Denese KillingsAgarwala, [facility] 12/05/17  LM

## 2017-12-13 ENCOUNTER — Telehealth: Payer: Self-pay

## 2017-12-13 NOTE — Telephone Encounter (Signed)
Received d/c from [facility]...  Patient of Doctor Agarwala.  D/c will be taken to [address] for signature.  On 12/14/17 Received d/c back from Doctor Agarwala. Call Lahoma Rocker at [facility] to let her know d/c was mailed to health dept.

## 2018-08-01 ENCOUNTER — Ambulatory Visit: Payer: Medicaid Other | Admitting: Adult Health
